# Patient Record
Sex: Female | Born: 1950 | Race: White | Hispanic: No | Marital: Married | Smoking: Never smoker
Health system: Southern US, Community
[De-identification: ages and names within clinical notes are randomized; demographics above are authoritative.]

## PROBLEM LIST (undated history)

## (undated) DIAGNOSIS — K589 Irritable bowel syndrome without diarrhea: Secondary | ICD-10-CM

## (undated) DIAGNOSIS — T7840XA Allergy, unspecified, initial encounter: Secondary | ICD-10-CM

## (undated) DIAGNOSIS — K5790 Diverticulosis of intestine, part unspecified, without perforation or abscess without bleeding: Secondary | ICD-10-CM

## (undated) DIAGNOSIS — M1990 Unspecified osteoarthritis, unspecified site: Secondary | ICD-10-CM

## (undated) DIAGNOSIS — I10 Essential (primary) hypertension: Secondary | ICD-10-CM

## (undated) DIAGNOSIS — R7303 Prediabetes: Secondary | ICD-10-CM

## (undated) DIAGNOSIS — R519 Headache, unspecified: Secondary | ICD-10-CM

## (undated) DIAGNOSIS — D649 Anemia, unspecified: Secondary | ICD-10-CM

## (undated) DIAGNOSIS — Z5189 Encounter for other specified aftercare: Secondary | ICD-10-CM

## (undated) DIAGNOSIS — N390 Urinary tract infection, site not specified: Secondary | ICD-10-CM

## (undated) DIAGNOSIS — G8929 Other chronic pain: Secondary | ICD-10-CM

## (undated) DIAGNOSIS — E039 Hypothyroidism, unspecified: Secondary | ICD-10-CM

## (undated) DIAGNOSIS — E785 Hyperlipidemia, unspecified: Secondary | ICD-10-CM

## (undated) HISTORY — DX: Allergy, unspecified, initial encounter: T78.40XA

## (undated) HISTORY — DX: Headache, unspecified: R51.9

## (undated) HISTORY — DX: Irritable bowel syndrome, unspecified: K58.9

## (undated) HISTORY — DX: Anemia, unspecified: D64.9

## (undated) HISTORY — DX: Urinary tract infection, site not specified: N39.0

## (undated) HISTORY — DX: Essential (primary) hypertension: I10

## (undated) HISTORY — DX: Unspecified osteoarthritis, unspecified site: M19.90

## (undated) HISTORY — DX: Prediabetes: R73.03

## (undated) HISTORY — DX: Encounter for other specified aftercare: Z51.89

## (undated) HISTORY — DX: Hyperlipidemia, unspecified: E78.5

## (undated) HISTORY — DX: Diverticulosis of intestine, part unspecified, without perforation or abscess without bleeding: K57.90

## (undated) HISTORY — PX: TONSILLECTOMY: SUR1361

## (undated) HISTORY — DX: Other chronic pain: G89.29

## (undated) HISTORY — DX: Hypothyroidism, unspecified: E03.9

---

## 2001-04-16 ENCOUNTER — Other Ambulatory Visit: Admission: RE | Admit: 2001-04-16 | Discharge: 2001-04-16 | Payer: Self-pay | Admitting: Family Medicine

## 2007-07-24 ENCOUNTER — Encounter: Payer: Self-pay | Admitting: Gastroenterology

## 2008-01-14 ENCOUNTER — Encounter: Payer: Self-pay | Admitting: Gastroenterology

## 2008-03-29 ENCOUNTER — Encounter: Payer: Self-pay | Admitting: Gastroenterology

## 2008-04-23 ENCOUNTER — Ambulatory Visit: Payer: Self-pay | Admitting: Gastroenterology

## 2008-04-23 DIAGNOSIS — R51 Headache: Secondary | ICD-10-CM

## 2008-04-23 DIAGNOSIS — E0590 Thyrotoxicosis, unspecified without thyrotoxic crisis or storm: Secondary | ICD-10-CM | POA: Insufficient documentation

## 2008-04-23 DIAGNOSIS — E785 Hyperlipidemia, unspecified: Secondary | ICD-10-CM | POA: Insufficient documentation

## 2008-04-23 DIAGNOSIS — Z862 Personal history of diseases of the blood and blood-forming organs and certain disorders involving the immune mechanism: Secondary | ICD-10-CM | POA: Insufficient documentation

## 2008-04-23 DIAGNOSIS — R945 Abnormal results of liver function studies: Secondary | ICD-10-CM

## 2008-04-23 DIAGNOSIS — F411 Generalized anxiety disorder: Secondary | ICD-10-CM | POA: Insufficient documentation

## 2008-04-23 DIAGNOSIS — R519 Headache, unspecified: Secondary | ICD-10-CM | POA: Insufficient documentation

## 2008-04-23 DIAGNOSIS — E663 Overweight: Secondary | ICD-10-CM | POA: Insufficient documentation

## 2008-04-29 ENCOUNTER — Ambulatory Visit (HOSPITAL_COMMUNITY): Admission: RE | Admit: 2008-04-29 | Discharge: 2008-04-29 | Payer: Self-pay | Admitting: Gastroenterology

## 2008-05-10 ENCOUNTER — Telehealth: Payer: Self-pay | Admitting: Gastroenterology

## 2009-11-12 ENCOUNTER — Encounter: Payer: Self-pay | Admitting: Sports Medicine

## 2009-11-18 ENCOUNTER — Ambulatory Visit: Payer: Self-pay | Admitting: Sports Medicine

## 2009-11-18 DIAGNOSIS — M25579 Pain in unspecified ankle and joints of unspecified foot: Secondary | ICD-10-CM | POA: Insufficient documentation

## 2009-11-18 DIAGNOSIS — M722 Plantar fascial fibromatosis: Secondary | ICD-10-CM

## 2009-11-18 DIAGNOSIS — M7750 Other enthesopathy of unspecified foot: Secondary | ICD-10-CM | POA: Insufficient documentation

## 2009-11-18 DIAGNOSIS — M25569 Pain in unspecified knee: Secondary | ICD-10-CM
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
## 2010-09-07 NOTE — Letter (Signed)
Summary: [facility]  [facility]   Imported By: Knox Royalty 11/23/2009 11:34:36  _____________________________________________________________________  External Attachment:    Type:   Image     Comment:   External Document

## 2010-10-07 ENCOUNTER — Encounter: Payer: Self-pay | Admitting: *Deleted

## 2013-08-06 HISTORY — PX: FRACTURE SURGERY: SHX138

## 2014-11-23 ENCOUNTER — Encounter: Payer: Self-pay | Admitting: Family Medicine

## 2014-11-23 DIAGNOSIS — I10 Essential (primary) hypertension: Secondary | ICD-10-CM | POA: Insufficient documentation

## 2016-05-01 ENCOUNTER — Ambulatory Visit: Payer: Self-pay | Admitting: Sports Medicine

## 2016-10-25 DIAGNOSIS — G8929 Other chronic pain: Secondary | ICD-10-CM | POA: Insufficient documentation

## 2016-10-26 ENCOUNTER — Other Ambulatory Visit: Payer: Self-pay | Admitting: Orthopedic Surgery

## 2016-10-26 DIAGNOSIS — R531 Weakness: Secondary | ICD-10-CM

## 2016-10-26 DIAGNOSIS — R52 Pain, unspecified: Secondary | ICD-10-CM

## 2016-11-06 ENCOUNTER — Ambulatory Visit
Admission: RE | Admit: 2016-11-06 | Discharge: 2016-11-06 | Disposition: A | Discharge status: Home / Self Care | Payer: Medicare Other | Source: Ambulatory Visit | Attending: Orthopedic Surgery | Admitting: Orthopedic Surgery

## 2016-11-06 ENCOUNTER — Other Ambulatory Visit: Payer: Self-pay

## 2016-11-06 DIAGNOSIS — R531 Weakness: Secondary | ICD-10-CM

## 2017-08-06 HISTORY — PX: BREAST BIOPSY: SHX20

## 2017-11-15 ENCOUNTER — Other Ambulatory Visit: Payer: Self-pay | Admitting: Family Medicine

## 2017-11-15 DIAGNOSIS — Z139 Encounter for screening, unspecified: Secondary | ICD-10-CM

## 2017-12-09 ENCOUNTER — Ambulatory Visit
Admission: RE | Admit: 2017-12-09 | Discharge: 2017-12-09 | Disposition: A | Discharge status: Home / Self Care | Payer: Medicare Other | Source: Ambulatory Visit | Attending: Family Medicine | Admitting: Family Medicine

## 2017-12-09 DIAGNOSIS — Z139 Encounter for screening, unspecified: Secondary | ICD-10-CM

## 2017-12-11 ENCOUNTER — Other Ambulatory Visit: Payer: Self-pay | Admitting: Family Medicine

## 2017-12-11 DIAGNOSIS — R928 Other abnormal and inconclusive findings on diagnostic imaging of breast: Secondary | ICD-10-CM

## 2017-12-13 ENCOUNTER — Ambulatory Visit
Admission: RE | Admit: 2017-12-13 | Discharge: 2017-12-13 | Disposition: A | Discharge status: Home / Self Care | Payer: Medicare Other | Source: Ambulatory Visit | Attending: Family Medicine | Admitting: Family Medicine

## 2017-12-13 ENCOUNTER — Other Ambulatory Visit: Payer: Self-pay | Admitting: Family Medicine

## 2017-12-13 DIAGNOSIS — R928 Other abnormal and inconclusive findings on diagnostic imaging of breast: Secondary | ICD-10-CM

## 2017-12-13 DIAGNOSIS — N6310 Unspecified lump in the right breast, unspecified quadrant: Secondary | ICD-10-CM

## 2018-02-04 DIAGNOSIS — H9201 Otalgia, right ear: Secondary | ICD-10-CM | POA: Insufficient documentation

## 2018-02-04 DIAGNOSIS — H6121 Impacted cerumen, right ear: Secondary | ICD-10-CM | POA: Insufficient documentation

## 2018-06-16 ENCOUNTER — Other Ambulatory Visit: Payer: Self-pay | Admitting: Family Medicine

## 2018-06-16 ENCOUNTER — Ambulatory Visit
Admission: RE | Admit: 2018-06-16 | Discharge: 2018-06-16 | Disposition: A | Discharge status: Home / Self Care | Payer: Medicare Other | Source: Ambulatory Visit | Attending: Family Medicine | Admitting: Family Medicine

## 2018-06-16 DIAGNOSIS — N6310 Unspecified lump in the right breast, unspecified quadrant: Secondary | ICD-10-CM

## 2018-06-20 ENCOUNTER — Other Ambulatory Visit: Payer: Self-pay | Admitting: Family Medicine

## 2018-06-20 ENCOUNTER — Ambulatory Visit
Admission: RE | Admit: 2018-06-20 | Discharge: 2018-06-20 | Disposition: A | Discharge status: Home / Self Care | Payer: PRIVATE HEALTH INSURANCE | Source: Ambulatory Visit | Attending: Family Medicine | Admitting: Family Medicine

## 2018-06-20 ENCOUNTER — Ambulatory Visit
Admission: RE | Admit: 2018-06-20 | Discharge: 2018-06-20 | Disposition: A | Discharge status: Home / Self Care | Payer: Medicare Other | Source: Ambulatory Visit | Attending: Family Medicine | Admitting: Family Medicine

## 2018-06-20 DIAGNOSIS — N6310 Unspecified lump in the right breast, unspecified quadrant: Secondary | ICD-10-CM

## 2018-06-25 ENCOUNTER — Other Ambulatory Visit: Payer: Self-pay | Admitting: Family Medicine

## 2018-06-25 ENCOUNTER — Ambulatory Visit
Admission: RE | Admit: 2018-06-25 | Discharge: 2018-06-25 | Disposition: A | Discharge status: Home / Self Care | Payer: Medicare Other | Source: Ambulatory Visit | Attending: Family Medicine | Admitting: Family Medicine

## 2018-06-25 DIAGNOSIS — N63 Unspecified lump in unspecified breast: Secondary | ICD-10-CM

## 2018-07-28 ENCOUNTER — Other Ambulatory Visit: Payer: Self-pay | Admitting: General Surgery

## 2018-07-28 DIAGNOSIS — N6489 Other specified disorders of breast: Secondary | ICD-10-CM

## 2018-08-22 ENCOUNTER — Ambulatory Visit
Admission: RE | Admit: 2018-08-22 | Discharge: 2018-08-22 | Disposition: A | Discharge status: Home / Self Care | Payer: Medicare Other | Source: Ambulatory Visit | Attending: General Surgery | Admitting: General Surgery

## 2018-08-22 ENCOUNTER — Encounter (INDEPENDENT_AMBULATORY_CARE_PROVIDER_SITE_OTHER): Payer: Self-pay

## 2018-08-22 DIAGNOSIS — N6489 Other specified disorders of breast: Secondary | ICD-10-CM

## 2018-09-09 ENCOUNTER — Emergency Department (HOSPITAL_COMMUNITY): Payer: Medicare Other

## 2018-09-09 ENCOUNTER — Emergency Department (HOSPITAL_COMMUNITY)
Admission: EM | Admit: 2018-09-09 | Discharge: 2018-09-09 | Disposition: A | Discharge status: Home / Self Care | Payer: Medicare Other | Attending: Emergency Medicine | Admitting: Emergency Medicine

## 2018-09-09 ENCOUNTER — Encounter (HOSPITAL_COMMUNITY): Payer: Self-pay | Admitting: Emergency Medicine

## 2018-09-09 ENCOUNTER — Other Ambulatory Visit: Payer: Self-pay

## 2018-09-09 DIAGNOSIS — R0789 Other chest pain: Secondary | ICD-10-CM | POA: Diagnosis not present

## 2018-09-09 DIAGNOSIS — I10 Essential (primary) hypertension: Secondary | ICD-10-CM | POA: Diagnosis not present

## 2018-09-09 DIAGNOSIS — Z79899 Other long term (current) drug therapy: Secondary | ICD-10-CM | POA: Insufficient documentation

## 2018-09-09 DIAGNOSIS — M542 Cervicalgia: Secondary | ICD-10-CM | POA: Diagnosis present
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
## 2018-09-09 NOTE — ED Notes (Signed)
Family at bedside. 
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
## 2018-09-09 NOTE — Discharge Instructions (Signed)

## 2018-09-09 NOTE — ED Notes (Signed)
ED Provider at bedside. 

## 2018-09-24 DIAGNOSIS — R0789 Other chest pain: Secondary | ICD-10-CM | POA: Insufficient documentation

## 2018-09-26 ENCOUNTER — Other Ambulatory Visit: Payer: Self-pay | Admitting: Orthopedic Surgery

## 2018-09-26 DIAGNOSIS — R072 Precordial pain: Secondary | ICD-10-CM

## 2018-10-06 ENCOUNTER — Ambulatory Visit
Admission: RE | Admit: 2018-10-06 | Discharge: 2018-10-06 | Disposition: A | Discharge status: Home / Self Care | Payer: Medicare Other | Source: Ambulatory Visit | Attending: Orthopedic Surgery | Admitting: Orthopedic Surgery

## 2018-10-06 DIAGNOSIS — R072 Precordial pain: Secondary | ICD-10-CM

## 2019-01-20 ENCOUNTER — Other Ambulatory Visit: Payer: Self-pay | Admitting: Orthopedic Surgery

## 2019-01-20 DIAGNOSIS — R072 Precordial pain: Secondary | ICD-10-CM

## 2019-01-27 ENCOUNTER — Ambulatory Visit
Admission: RE | Admit: 2019-01-27 | Discharge: 2019-01-27 | Disposition: A | Discharge status: Home / Self Care | Payer: Medicare Other | Source: Ambulatory Visit | Attending: Orthopedic Surgery | Admitting: Orthopedic Surgery

## 2019-01-27 DIAGNOSIS — R072 Precordial pain: Secondary | ICD-10-CM

## 2019-08-14 DIAGNOSIS — M25562 Pain in left knee: Secondary | ICD-10-CM | POA: Diagnosis not present

## 2019-09-05 DIAGNOSIS — M659 Synovitis and tenosynovitis, unspecified: Secondary | ICD-10-CM | POA: Diagnosis not present

## 2019-09-07 DIAGNOSIS — M1712 Unilateral primary osteoarthritis, left knee: Secondary | ICD-10-CM | POA: Diagnosis not present

## 2019-09-07 DIAGNOSIS — M2352 Chronic instability of knee, left knee: Secondary | ICD-10-CM | POA: Diagnosis not present

## 2019-09-11 ENCOUNTER — Other Ambulatory Visit: Payer: Self-pay | Admitting: Orthopedic Surgery

## 2019-09-11 DIAGNOSIS — M2352 Chronic instability of knee, left knee: Secondary | ICD-10-CM

## 2019-09-11 DIAGNOSIS — M25562 Pain in left knee: Secondary | ICD-10-CM

## 2019-09-14 ENCOUNTER — Ambulatory Visit
Admission: RE | Admit: 2019-09-14 | Discharge: 2019-09-14 | Disposition: A | Discharge status: Home / Self Care | Payer: Medicare Other | Source: Ambulatory Visit | Attending: Orthopedic Surgery | Admitting: Orthopedic Surgery

## 2019-09-14 DIAGNOSIS — S83422A Sprain of lateral collateral ligament of left knee, initial encounter: Secondary | ICD-10-CM | POA: Diagnosis not present

## 2019-09-14 DIAGNOSIS — M25562 Pain in left knee: Secondary | ICD-10-CM

## 2019-09-14 DIAGNOSIS — M2352 Chronic instability of knee, left knee: Secondary | ICD-10-CM

## 2019-09-17 ENCOUNTER — Other Ambulatory Visit: Payer: Medicare Other

## 2019-09-17 DIAGNOSIS — S83422A Sprain of lateral collateral ligament of left knee, initial encounter: Secondary | ICD-10-CM | POA: Diagnosis not present

## 2019-09-17 DIAGNOSIS — M25562 Pain in left knee: Secondary | ICD-10-CM | POA: Diagnosis not present

## 2019-09-30 IMAGING — US ULTRASOUND RIGHT BREAST LIMITED
1 series · 11 of 11 positions shown · non-contrast
Comparison: Previous exam(s).

CLINICAL DATA: The patient is status post ultrasound-guided core
needle biopsy of 2 sites in her right breast on June 20, 2018.
She reports an increasing bruising and pain in her right breast. She
also reports bleeding from 1 of the biopsy sites yesterday.

EXAM:
ULTRASOUND OF THE RIGHT BREAST

[Series 1: ultrasound right breast limited · 0.10mm/px · 11 of 11 slices shown]
[im 1/11]
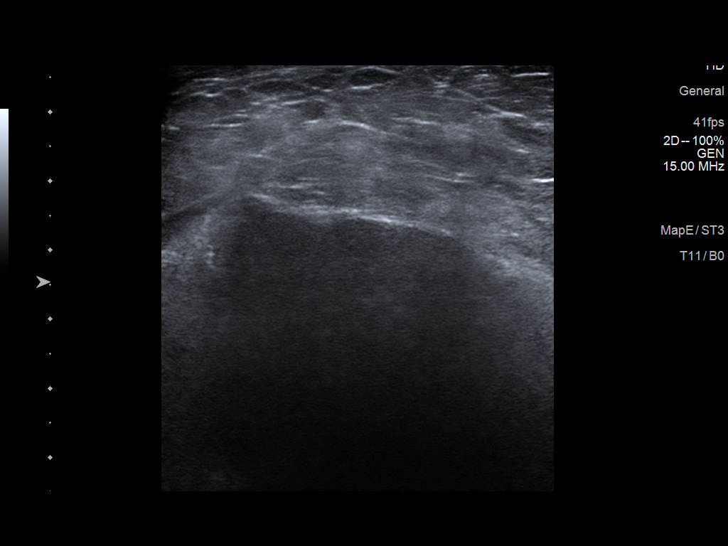
[im 2/11]
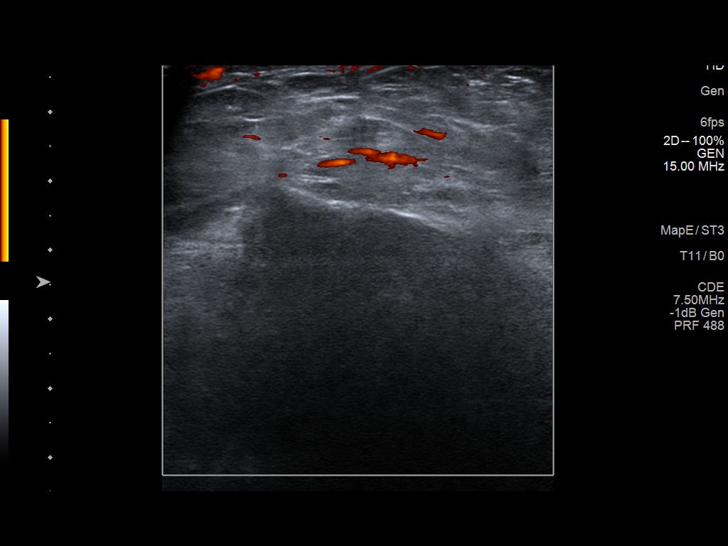
[im 3/11]
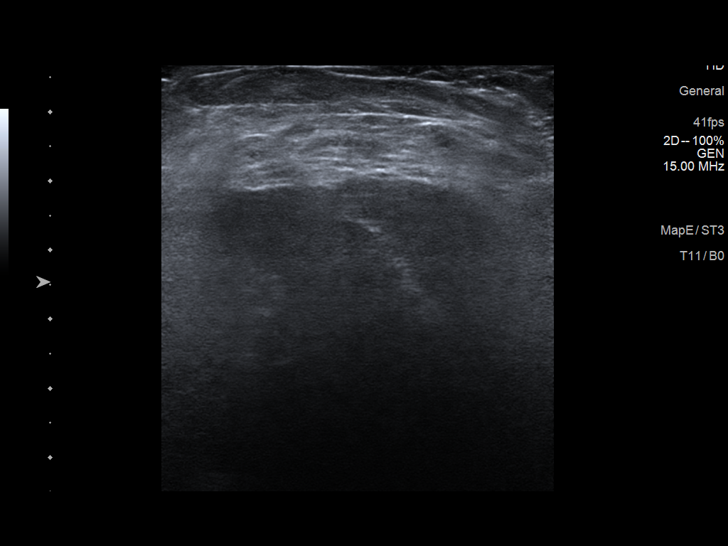
[im 4/11]
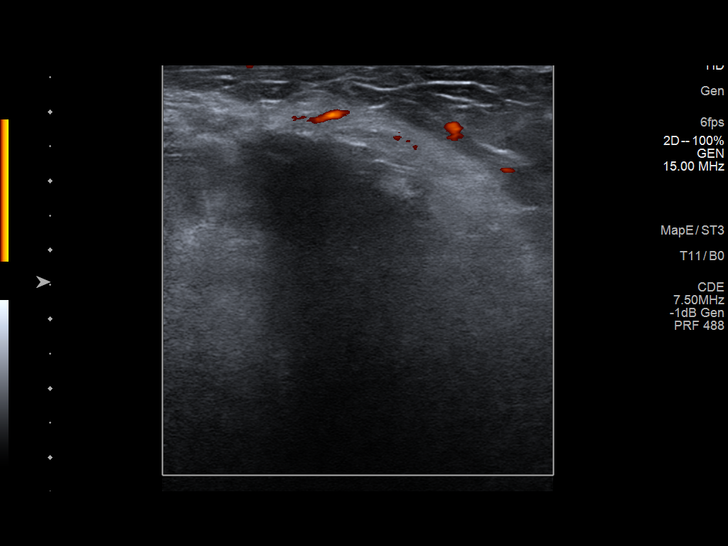
[im 5/11]
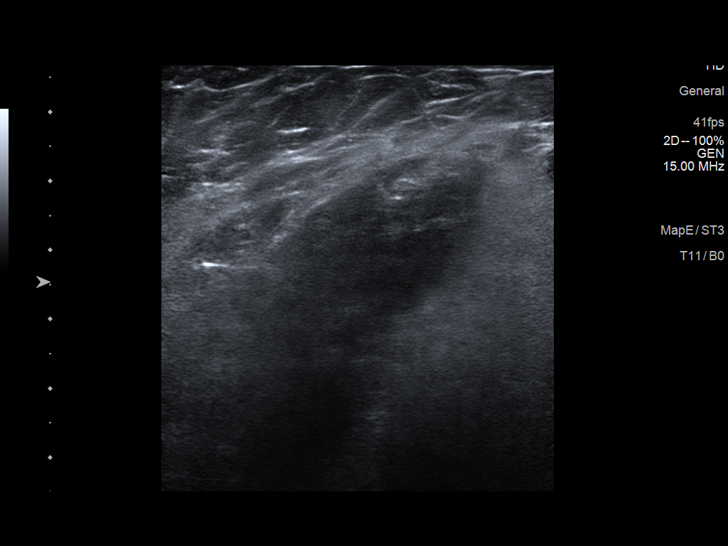
[im 6/11]
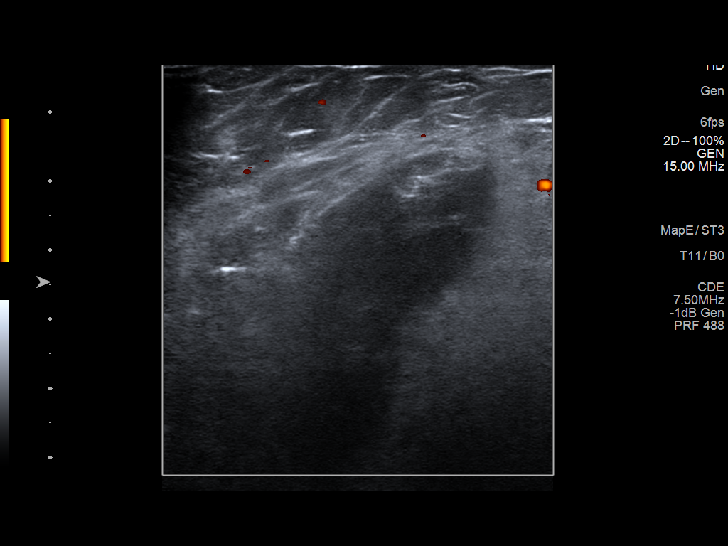
[im 7/11]
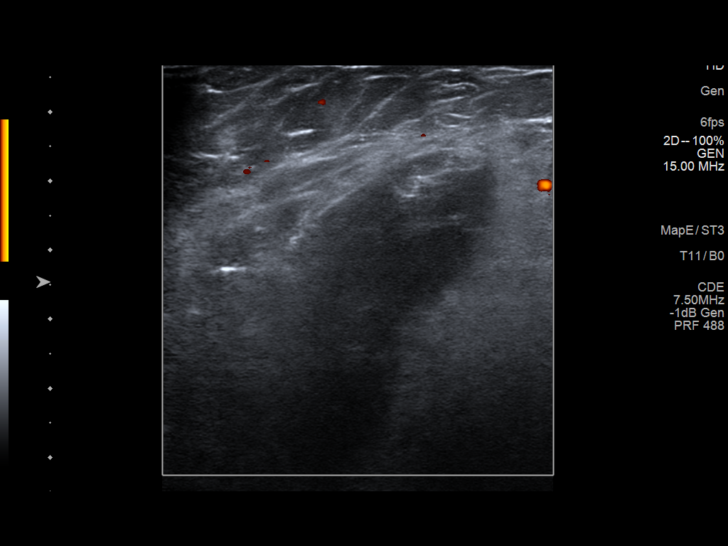
[im 8/11]
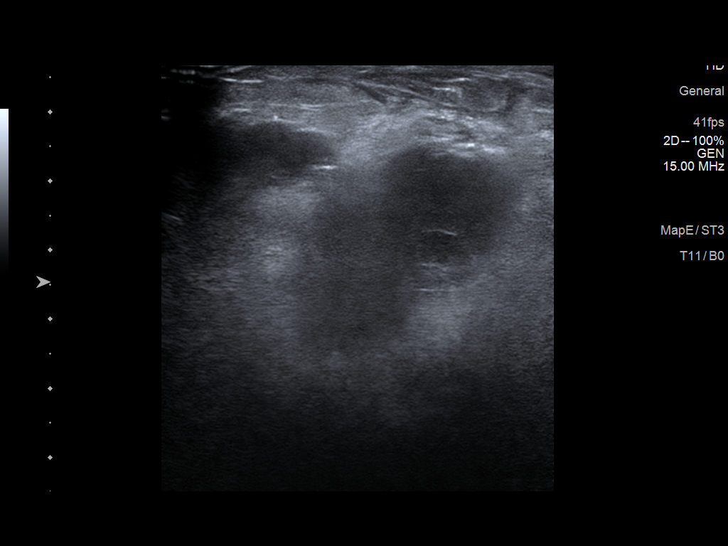
[im 9/11]
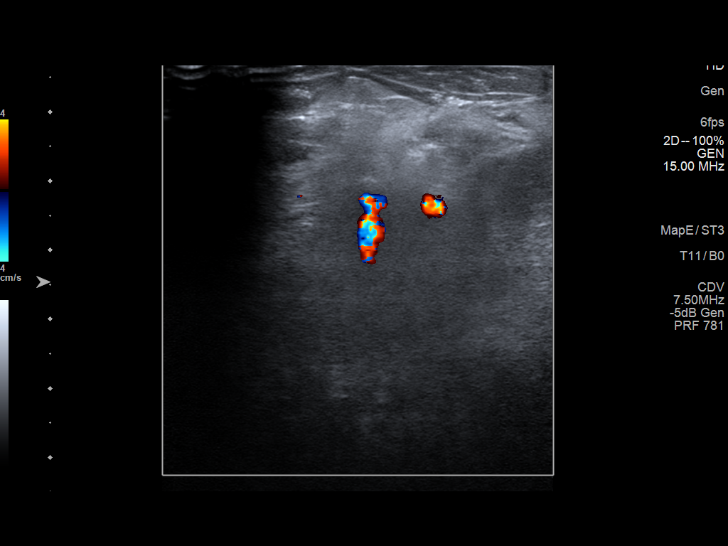
[im 10/11]
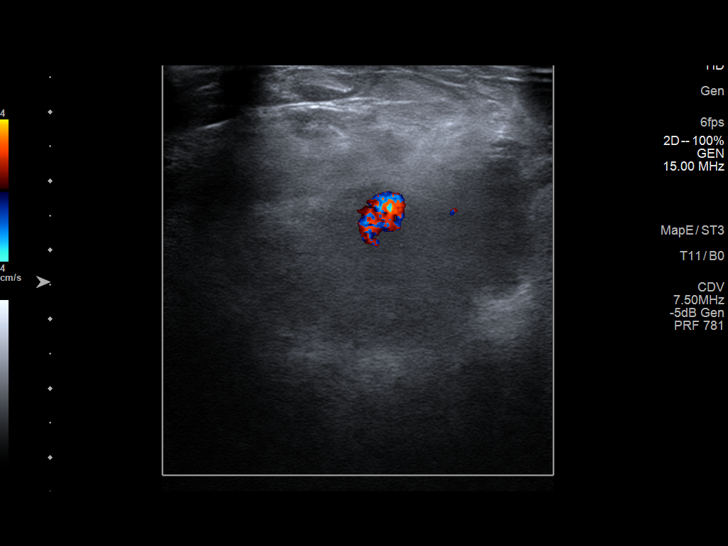
[im 11/11]
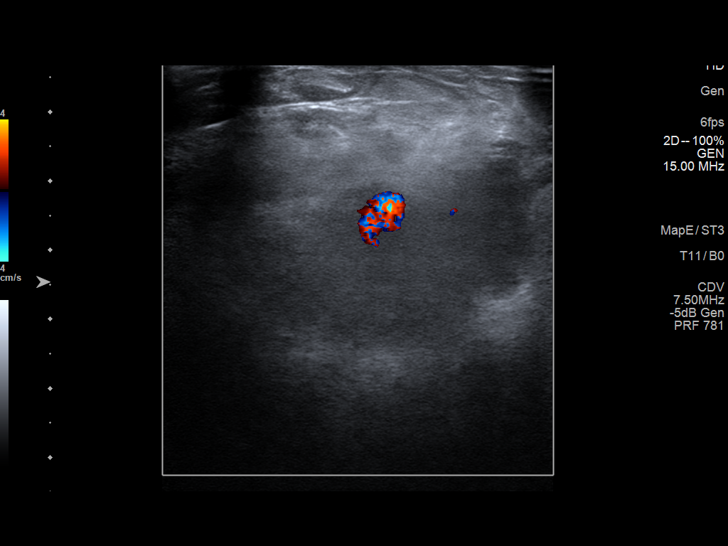

[11 of 11 positions shown; findings below may reference images not displayed]

FINDINGS: On physical exam, there is a large right breast hematoma, occupying
most of the lower and outer right breast, and extending into the
left breast. No active bleeding is seen at the biopsy sites.

Targeted ultrasound is performed, showing fluid collections in the
dependent portion of the right breast, extending from 6-9 o'clock.
At 9 o'clock 5 cm from the nipple there is a large vessel with a
disturbed turbulent high velocity flow.
IMPRESSION: Large right breast hematoma.

Large arterial vessel with disturbed turbulent flow in the right 9
o'clock breast, suspicious for AV fistula.

RECOMMENDATION:
Surgical consultation to assess further treatment options by
conservative means or surgical evacuation.

I have discussed the findings and recommendations with the patient.
Results were also provided in writing at the conclusion of the
visit. If applicable, a reminder letter will be sent to the patient
regarding the next appointment.

BI-RADS CATEGORY  2: Benign.

## 2019-12-31 DIAGNOSIS — H524 Presbyopia: Secondary | ICD-10-CM | POA: Diagnosis not present

## 2019-12-31 DIAGNOSIS — H52223 Regular astigmatism, bilateral: Secondary | ICD-10-CM | POA: Diagnosis not present

## 2019-12-31 DIAGNOSIS — H5203 Hypermetropia, bilateral: Secondary | ICD-10-CM | POA: Diagnosis not present

## 2019-12-31 DIAGNOSIS — H2513 Age-related nuclear cataract, bilateral: Secondary | ICD-10-CM | POA: Diagnosis not present

## 2020-01-19 DIAGNOSIS — R350 Frequency of micturition: Secondary | ICD-10-CM | POA: Diagnosis not present

## 2020-01-19 DIAGNOSIS — R300 Dysuria: Secondary | ICD-10-CM | POA: Diagnosis not present

## 2020-01-20 DIAGNOSIS — L218 Other seborrheic dermatitis: Secondary | ICD-10-CM | POA: Diagnosis not present

## 2020-01-20 DIAGNOSIS — L280 Lichen simplex chronicus: Secondary | ICD-10-CM | POA: Diagnosis not present

## 2020-01-20 DIAGNOSIS — L304 Erythema intertrigo: Secondary | ICD-10-CM | POA: Diagnosis not present

## 2020-02-16 DIAGNOSIS — R946 Abnormal results of thyroid function studies: Secondary | ICD-10-CM | POA: Diagnosis not present

## 2020-02-16 DIAGNOSIS — N390 Urinary tract infection, site not specified: Secondary | ICD-10-CM | POA: Diagnosis not present

## 2020-02-16 DIAGNOSIS — R7301 Impaired fasting glucose: Secondary | ICD-10-CM | POA: Diagnosis not present

## 2020-02-16 DIAGNOSIS — R5383 Other fatigue: Secondary | ICD-10-CM | POA: Diagnosis not present

## 2020-02-16 DIAGNOSIS — E782 Mixed hyperlipidemia: Secondary | ICD-10-CM | POA: Diagnosis not present

## 2020-02-16 DIAGNOSIS — E039 Hypothyroidism, unspecified: Secondary | ICD-10-CM | POA: Diagnosis not present

## 2020-03-02 DIAGNOSIS — L304 Erythema intertrigo: Secondary | ICD-10-CM | POA: Diagnosis not present

## 2020-03-02 DIAGNOSIS — L218 Other seborrheic dermatitis: Secondary | ICD-10-CM | POA: Diagnosis not present

## 2020-03-02 DIAGNOSIS — L280 Lichen simplex chronicus: Secondary | ICD-10-CM | POA: Diagnosis not present

## 2020-04-14 DIAGNOSIS — R7303 Prediabetes: Secondary | ICD-10-CM | POA: Diagnosis not present

## 2020-04-14 DIAGNOSIS — I10 Essential (primary) hypertension: Secondary | ICD-10-CM | POA: Diagnosis not present

## 2020-04-14 DIAGNOSIS — E785 Hyperlipidemia, unspecified: Secondary | ICD-10-CM | POA: Diagnosis not present

## 2020-04-15 ENCOUNTER — Other Ambulatory Visit (HOSPITAL_COMMUNITY): Payer: Self-pay | Admitting: Internal Medicine

## 2020-04-15 DIAGNOSIS — I739 Peripheral vascular disease, unspecified: Secondary | ICD-10-CM

## 2020-04-18 ENCOUNTER — Ambulatory Visit (HOSPITAL_COMMUNITY)
Admission: RE | Admit: 2020-04-18 | Discharge: 2020-04-18 | Disposition: A | Discharge status: Home / Self Care | Payer: Medicare Other | Source: Ambulatory Visit | Attending: Internal Medicine | Admitting: Internal Medicine

## 2020-04-18 ENCOUNTER — Other Ambulatory Visit: Payer: Self-pay

## 2020-04-18 DIAGNOSIS — I739 Peripheral vascular disease, unspecified: Secondary | ICD-10-CM | POA: Diagnosis not present

## 2020-04-22 DIAGNOSIS — N390 Urinary tract infection, site not specified: Secondary | ICD-10-CM | POA: Diagnosis not present

## 2020-04-22 DIAGNOSIS — R300 Dysuria: Secondary | ICD-10-CM | POA: Diagnosis not present

## 2020-04-25 ENCOUNTER — Other Ambulatory Visit: Payer: Self-pay | Admitting: Internal Medicine

## 2020-04-25 DIAGNOSIS — R5381 Other malaise: Secondary | ICD-10-CM

## 2020-04-28 DIAGNOSIS — R350 Frequency of micturition: Secondary | ICD-10-CM | POA: Diagnosis not present

## 2020-04-28 DIAGNOSIS — N390 Urinary tract infection, site not specified: Secondary | ICD-10-CM | POA: Diagnosis not present

## 2020-05-03 ENCOUNTER — Other Ambulatory Visit: Payer: Self-pay | Admitting: Internal Medicine

## 2020-06-06 DIAGNOSIS — Z1211 Encounter for screening for malignant neoplasm of colon: Secondary | ICD-10-CM | POA: Diagnosis not present

## 2020-06-06 DIAGNOSIS — Z1212 Encounter for screening for malignant neoplasm of rectum: Secondary | ICD-10-CM | POA: Diagnosis not present

## 2020-06-17 LAB — COLOGUARD: COLOGUARD: NEGATIVE

## 2020-07-05 DIAGNOSIS — U071 COVID-19: Secondary | ICD-10-CM | POA: Diagnosis not present

## 2020-07-05 DIAGNOSIS — R509 Fever, unspecified: Secondary | ICD-10-CM | POA: Diagnosis not present

## 2020-07-05 DIAGNOSIS — I10 Essential (primary) hypertension: Secondary | ICD-10-CM | POA: Diagnosis not present

## 2020-07-05 DIAGNOSIS — Z1152 Encounter for screening for COVID-19: Secondary | ICD-10-CM | POA: Diagnosis not present

## 2020-07-06 ENCOUNTER — Ambulatory Visit (HOSPITAL_COMMUNITY)
Admission: RE | Admit: 2020-07-06 | Discharge: 2020-07-06 | Disposition: A | Discharge status: Home / Self Care | Payer: Medicare Other | Source: Ambulatory Visit | Attending: Pulmonary Disease | Admitting: Pulmonary Disease

## 2020-07-06 ENCOUNTER — Other Ambulatory Visit: Payer: Self-pay | Admitting: Oncology

## 2020-07-06 DIAGNOSIS — U071 COVID-19: Secondary | ICD-10-CM | POA: Insufficient documentation

## 2020-07-06 DIAGNOSIS — Z23 Encounter for immunization: Secondary | ICD-10-CM | POA: Diagnosis not present

## 2020-07-06 MED ORDER — SOTROVIMAB 500 MG/8ML IV SOLN
500.0000 mg | Freq: Once | INTRAVENOUS | Status: AC
  Administered 2020-07-06: 500 mg via INTRAVENOUS

## 2020-07-06 MED ORDER — FAMOTIDINE IN NACL 20-0.9 MG/50ML-% IV SOLN: 20.0000 mg | Freq: Once | INTRAVENOUS | Status: DC | PRN

## 2020-07-06 MED ORDER — SODIUM CHLORIDE 0.9 % IV SOLN: INTRAVENOUS | Status: DC | PRN

## 2020-07-06 MED ORDER — ALBUTEROL SULFATE HFA 108 (90 BASE) MCG/ACT IN AERS: 2.0000 | INHALATION_SPRAY | Freq: Once | RESPIRATORY_TRACT | Status: DC | PRN

## 2020-07-06 MED ORDER — DIPHENHYDRAMINE HCL 50 MG/ML IJ SOLN: 50.0000 mg | Freq: Once | INTRAMUSCULAR | Status: DC | PRN

## 2020-07-06 MED ORDER — METHYLPREDNISOLONE SODIUM SUCC 125 MG IJ SOLR: 125.0000 mg | Freq: Once | INTRAMUSCULAR | Status: DC | PRN

## 2020-07-06 MED ORDER — EPINEPHRINE 0.3 MG/0.3ML IJ SOAJ: 0.3000 mg | Freq: Once | INTRAMUSCULAR | Status: DC | PRN

## 2020-07-06 NOTE — Progress Notes (Signed)
I connected by phone with  Mrs. Schreur to discuss the potential use of an new treatment for mild to moderate COVID-19 viral infection in non-hospitalized patients.   This patient is a age/sex that meets the FDA criteria for Emergency Use Authorization of casirivimab\imdevimab.  Has a (+) direct SARS-CoV-2 viral test result 1. Has mild or moderate COVID-19  2. Is ? 71 years of age and weighs ? 40 kg 3. Is NOT hospitalized due to COVID-19 4. Is NOT requiring oxygen therapy or requiring an increase in baseline oxygen flow rate due to COVID-19 5. Is within 10 days of symptom onset 6. Has at least one of the high risk factor(s) for progression to severe COVID-19 and/or hospitalization as defined in EUA. Specific high risk criteria : Past Medical History:  Diagnosis Date  . Hyperlipidemia   . Hypertension   ?  ?    Symptom onset  06/29/20   I have spoken and communicated the following to the patient or parent/caregiver:   1. FDA has authorized the emergency use of casirivimab\imdevimab for the treatment of mild to moderate COVID-19 in adults and pediatric patients with positive results of direct SARS-CoV-2 viral testing who are 79 years of age and older weighing at least 40 kg, and who are at high risk for progressing to severe COVID-19 and/or hospitalization.   2. The significant known and potential risks and benefits of casirivimab\imdevimab, and the extent to which such potential risks and benefits are unknown.   3. Information on available alternative treatments and the risks and benefits of those alternatives, including clinical trials.   4. Patients treated with casirivimab\imdevimab should continue to self-isolate and use infection control measures (e.g., wear mask, isolate, social distance, avoid sharing personal items, clean and disinfect "high touch" surfaces, and frequent handwashing) according to CDC guidelines.    5. The patient or parent/caregiver has the option to accept or  refuse casirivimab\imdevimab .   After reviewing this information with the patient, The patient agreed to proceed with receiving casirivimab\imdevimab infusion and will be provided a copy of the Fact sheet prior to receiving the infusion.Rulon Abide, AGNP-C [phone] ([place])

## 2020-07-06 NOTE — Discharge Instructions (Signed)
10 Things You Can Do to Manage Your COVID-19 Symptoms at Home If you have possible or confirmed COVID-19: 1. Stay home from work and school. And stay away from other public places. If you must go out, avoid using any kind of public transportation, ridesharing, or taxis. 2. Monitor your symptoms carefully. If your symptoms get worse, call your healthcare provider immediately. 3. Get rest and stay hydrated. 4. If you have a medical appointment, call the healthcare provider ahead of time and tell them that you have or may have COVID-19. 5. For medical emergencies, call 911 and notify the dispatch personnel that you have or may have COVID-19. 6. Cover your cough and sneezes with a tissue or use the inside of your elbow. 7. Wash your hands often with soap and water for at least 20 seconds or clean your hands with an alcohol-based hand sanitizer that contains at least 60% alcohol. 8. As much as possible, stay in a specific room and away from other people in your home. Also, you should use a separate bathroom, if available. If you need to be around other people in or outside of the home, wear a mask. 9. Avoid sharing personal items with other people in your household, like dishes, towels, and bedding. 10. Clean all surfaces that are touched often, like counters, tabletops, and doorknobs. Use household cleaning sprays or wipes according to the label instructions. [email or web address] 02/04/2019 This information is not intended to replace advice given to you by your health care provider. Make sure you discuss any questions you have with your health care provider. Document Revised: 07/09/2019 Document Reviewed: 07/09/2019 Elsevier Patient Education  [place].   What types of side effects do monoclonal antibody drugs cause?  Common side effects  In general, the more common side effects caused by monoclonal antibody drugs include: . Allergic reactions, such as hives or itching . Flu-like signs  and symptoms, including chills, fatigue, fever, and muscle aches and pains . Nausea, vomiting . Diarrhea . Skin rashes . Low blood pressure   The CDC is recommending patients who receive monoclonal antibody treatments wait at least 90 days before being vaccinated.  Currently, there are no data on the safety and efficacy of mRNA COVID-19 vaccines in persons who received monoclonal antibodies or convalescent plasma as part of COVID-19 treatment. Based on the estimated half-life of such therapies as well as evidence suggesting that reinfection is uncommon in the 90 days after initial infection, vaccination should be deferred for at least 90 days, as a precautionary measure until additional information becomes available, to avoid interference of the antibody treatment with vaccine-induced immune responses.   What types of side effects do monoclonal antibody drugs cause?  Common side effects  In general, the more common side effects caused by monoclonal antibody drugs include: . Allergic reactions, such as hives or itching . Flu-like signs and symptoms, including chills, fatigue, fever, and muscle aches and pains . Nausea, vomiting . Diarrhea . Skin rashes . Low blood pressure   The CDC is recommending patients who receive monoclonal antibody treatments wait at least 90 days before being vaccinated.  Currently, there are no data on the safety and efficacy of mRNA COVID-19 vaccines in persons who received monoclonal antibodies or convalescent plasma as part of COVID-19 treatment. Based on the estimated half-life of such therapies as well as evidence suggesting that reinfection is uncommon in the 90 days after initial infection, vaccination should be deferred for at least 90 days, as a precautionary  measure until additional information becomes available, to avoid interference of the antibody treatment with vaccine-induced immune responses.'  If you have any questions or concerns after the  infusion please call the [facility] Provider on call at [phone]. This number is ONLY intended for your use regarding questions or concerns about the infusion post-treatment side-effects.  Please do not provide this number to others for use. For return to work notes please contact your primary care provider.   If someone you know is interested in receiving treatment please have them call the [facility] hotline at [phone].

## 2020-07-06 NOTE — Progress Notes (Signed)
Patient reviewed Fact Sheet for Patients, Parents, and Caregivers for Emergency Use Authorization (EUA) of Sotrovimab for the Treatment of Coronavirus. Patient also reviewed and is agreeable to the estimated cost of treatment. Patient is agreeable to proceed.   

## 2020-07-06 NOTE — Progress Notes (Signed)
Diagnosis: COVID-19  Physician: Dr. Patrick Wright  Procedure: [facility]fusion [facility] Med: Sotrovimab infusion - Provided patient with sotrovimab fact sheet for patients, parents, and caregivers prior to infusion.   Complications: No immediate complications noted  Discharge: Discharged home    

## 2020-07-07 ENCOUNTER — Ambulatory Visit (HOSPITAL_COMMUNITY): Payer: Medicare Other

## 2020-07-20 DIAGNOSIS — R309 Painful micturition, unspecified: Secondary | ICD-10-CM | POA: Diagnosis not present

## 2020-07-22 ENCOUNTER — Other Ambulatory Visit: Payer: Self-pay | Admitting: Internal Medicine

## 2020-07-22 DIAGNOSIS — I2510 Atherosclerotic heart disease of native coronary artery without angina pectoris: Secondary | ICD-10-CM

## 2020-07-22 DIAGNOSIS — E785 Hyperlipidemia, unspecified: Secondary | ICD-10-CM

## 2020-07-26 DIAGNOSIS — Z1231 Encounter for screening mammogram for malignant neoplasm of breast: Secondary | ICD-10-CM | POA: Diagnosis not present

## 2020-08-04 ENCOUNTER — Other Ambulatory Visit: Payer: Self-pay | Admitting: Obstetrics and Gynecology

## 2020-08-04 DIAGNOSIS — R921 Mammographic calcification found on diagnostic imaging of breast: Secondary | ICD-10-CM

## 2020-08-09 DIAGNOSIS — R0989 Other specified symptoms and signs involving the circulatory and respiratory systems: Secondary | ICD-10-CM | POA: Diagnosis not present

## 2020-08-09 DIAGNOSIS — I10 Essential (primary) hypertension: Secondary | ICD-10-CM | POA: Diagnosis not present

## 2020-08-24 ENCOUNTER — Other Ambulatory Visit: Payer: Self-pay | Admitting: Internal Medicine

## 2020-08-24 DIAGNOSIS — N951 Menopausal and female climacteric states: Secondary | ICD-10-CM

## 2020-08-24 DIAGNOSIS — E2839 Other primary ovarian failure: Secondary | ICD-10-CM

## 2020-08-24 DIAGNOSIS — E039 Hypothyroidism, unspecified: Secondary | ICD-10-CM

## 2020-09-05 DIAGNOSIS — R0982 Postnasal drip: Secondary | ICD-10-CM | POA: Diagnosis not present

## 2020-09-05 DIAGNOSIS — L299 Pruritus, unspecified: Secondary | ICD-10-CM | POA: Insufficient documentation

## 2020-09-05 DIAGNOSIS — H6123 Impacted cerumen, bilateral: Secondary | ICD-10-CM | POA: Diagnosis not present

## 2020-09-05 DIAGNOSIS — H938X3 Other specified disorders of ear, bilateral: Secondary | ICD-10-CM | POA: Diagnosis not present

## 2020-09-12 ENCOUNTER — Ambulatory Visit
Admission: RE | Admit: 2020-09-12 | Discharge: 2020-09-12 | Disposition: A | Discharge status: Home / Self Care | Payer: Medicare Other | Source: Ambulatory Visit | Attending: Obstetrics and Gynecology | Admitting: Obstetrics and Gynecology

## 2020-09-12 ENCOUNTER — Ambulatory Visit: Payer: Medicare Other

## 2020-09-12 ENCOUNTER — Other Ambulatory Visit: Payer: Self-pay

## 2020-09-12 ENCOUNTER — Other Ambulatory Visit: Payer: Self-pay | Admitting: Obstetrics and Gynecology

## 2020-09-12 DIAGNOSIS — R921 Mammographic calcification found on diagnostic imaging of breast: Secondary | ICD-10-CM

## 2020-09-12 DIAGNOSIS — R922 Inconclusive mammogram: Secondary | ICD-10-CM | POA: Diagnosis not present

## 2020-09-21 ENCOUNTER — Ambulatory Visit
Admission: RE | Admit: 2020-09-21 | Discharge: 2020-09-21 | Disposition: A | Discharge status: Home / Self Care | Payer: Medicare Other | Source: Ambulatory Visit | Attending: Adult Health | Admitting: Adult Health

## 2020-09-21 ENCOUNTER — Other Ambulatory Visit: Payer: Self-pay

## 2020-09-21 ENCOUNTER — Other Ambulatory Visit: Payer: Self-pay | Admitting: Adult Health

## 2020-09-21 ENCOUNTER — Encounter: Payer: Self-pay | Admitting: Gastroenterology

## 2020-09-21 DIAGNOSIS — K5732 Diverticulitis of large intestine without perforation or abscess without bleeding: Secondary | ICD-10-CM | POA: Diagnosis not present

## 2020-09-21 DIAGNOSIS — I10 Essential (primary) hypertension: Secondary | ICD-10-CM | POA: Diagnosis not present

## 2020-09-21 DIAGNOSIS — R1030 Lower abdominal pain, unspecified: Secondary | ICD-10-CM

## 2020-09-21 DIAGNOSIS — K5733 Diverticulitis of large intestine without perforation or abscess with bleeding: Secondary | ICD-10-CM

## 2020-09-21 DIAGNOSIS — K625 Hemorrhage of anus and rectum: Secondary | ICD-10-CM | POA: Diagnosis not present

## 2020-09-21 MED ORDER — IOPAMIDOL (ISOVUE-300) INJECTION 61%
100.0000 mL | Freq: Once | INTRAVENOUS | Status: AC | PRN
  Administered 2020-09-21: 100 mL via INTRAVENOUS

## 2020-09-22 ENCOUNTER — Ambulatory Visit
Admission: RE | Admit: 2020-09-22 | Discharge: 2020-09-22 | Disposition: A | Discharge status: Home / Self Care | Payer: No Typology Code available for payment source | Source: Ambulatory Visit | Attending: Internal Medicine | Admitting: Internal Medicine

## 2020-09-22 DIAGNOSIS — I2510 Atherosclerotic heart disease of native coronary artery without angina pectoris: Secondary | ICD-10-CM

## 2020-09-22 DIAGNOSIS — E785 Hyperlipidemia, unspecified: Secondary | ICD-10-CM

## 2020-09-28 ENCOUNTER — Observation Stay (HOSPITAL_COMMUNITY)
Admission: EM | Admit: 2020-09-28 | Discharge: 2020-09-29 | Disposition: A | Discharge status: Home / Self Care | Payer: Medicare Other | Attending: Internal Medicine | Admitting: Internal Medicine

## 2020-09-28 ENCOUNTER — Encounter (HOSPITAL_COMMUNITY): Payer: Self-pay

## 2020-09-28 ENCOUNTER — Emergency Department (HOSPITAL_COMMUNITY): Payer: Medicare Other

## 2020-09-28 ENCOUNTER — Other Ambulatory Visit: Payer: Self-pay

## 2020-09-28 DIAGNOSIS — K922 Gastrointestinal hemorrhage, unspecified: Principal | ICD-10-CM | POA: Diagnosis present

## 2020-09-28 DIAGNOSIS — K5732 Diverticulitis of large intestine without perforation or abscess without bleeding: Secondary | ICD-10-CM | POA: Insufficient documentation

## 2020-09-28 DIAGNOSIS — K5791 Diverticulosis of intestine, part unspecified, without perforation or abscess with bleeding: Secondary | ICD-10-CM | POA: Diagnosis not present

## 2020-09-28 DIAGNOSIS — K8020 Calculus of gallbladder without cholecystitis without obstruction: Secondary | ICD-10-CM | POA: Diagnosis not present

## 2020-09-28 DIAGNOSIS — E039 Hypothyroidism, unspecified: Secondary | ICD-10-CM | POA: Diagnosis not present

## 2020-09-28 DIAGNOSIS — K5733 Diverticulitis of large intestine without perforation or abscess with bleeding: Secondary | ICD-10-CM | POA: Diagnosis not present

## 2020-09-28 DIAGNOSIS — Z79890 Hormone replacement therapy (postmenopausal): Secondary | ICD-10-CM | POA: Insufficient documentation

## 2020-09-28 DIAGNOSIS — Z8616 Personal history of COVID-19: Secondary | ICD-10-CM | POA: Insufficient documentation

## 2020-09-28 DIAGNOSIS — Z7982 Long term (current) use of aspirin: Secondary | ICD-10-CM | POA: Insufficient documentation

## 2020-09-28 DIAGNOSIS — K5731 Diverticulosis of large intestine without perforation or abscess with bleeding: Secondary | ICD-10-CM | POA: Diagnosis not present

## 2020-09-28 DIAGNOSIS — Z79899 Other long term (current) drug therapy: Secondary | ICD-10-CM | POA: Diagnosis not present

## 2020-09-28 DIAGNOSIS — E785 Hyperlipidemia, unspecified: Secondary | ICD-10-CM | POA: Diagnosis not present

## 2020-09-28 DIAGNOSIS — I10 Essential (primary) hypertension: Secondary | ICD-10-CM | POA: Diagnosis present

## 2020-09-28 DIAGNOSIS — D62 Acute posthemorrhagic anemia: Secondary | ICD-10-CM

## 2020-09-28 DIAGNOSIS — K5792 Diverticulitis of intestine, part unspecified, without perforation or abscess without bleeding: Secondary | ICD-10-CM | POA: Diagnosis not present

## 2020-09-28 DIAGNOSIS — Z20822 Contact with and (suspected) exposure to covid-19: Secondary | ICD-10-CM | POA: Diagnosis not present

## 2020-09-28 DIAGNOSIS — K625 Hemorrhage of anus and rectum: Secondary | ICD-10-CM | POA: Diagnosis present

## 2020-09-28 HISTORY — PX: IR ANGIOGRAM SELECTIVE EACH ADDITIONAL VESSEL: IMG667

## 2020-09-28 HISTORY — PX: IR ANGIOGRAM VISCERAL SELECTIVE: IMG657

## 2020-09-28 HISTORY — PX: IR EMBO ART  VEN HEMORR LYMPH EXTRAV  INC GUIDE ROADMAPPING: IMG5450

## 2020-09-28 HISTORY — PX: IR US GUIDE VASC ACCESS RIGHT: IMG2390

## 2020-09-28 LAB — TYPE AND SCREEN
ABO/RH(D): O NEG
Antibody Screen: NEGATIVE

## 2020-09-28 LAB — COMPREHENSIVE METABOLIC PANEL
ALT: 43 U/L (ref 0–44)
AST: 35 U/L (ref 15–41)
Albumin: 3.7 g/dL (ref 3.5–5.0)
Alkaline Phosphatase: 44 U/L (ref 38–126)
Anion gap: 9 (ref 5–15)
BUN: 16 mg/dL (ref 8–23)
CO2: 23 mmol/L (ref 22–32)
Calcium: 8.8 mg/dL — ABNORMAL LOW (ref 8.9–10.3)
Chloride: 102 mmol/L (ref 98–111)
Creatinine, Ser: 0.74 mg/dL (ref 0.44–1.00)
GFR, Estimated: >60 mL/min (ref >60)
Glucose, Bld: 160 mg/dL — ABNORMAL HIGH (ref 70–99)
Potassium: 3.9 mmol/L (ref 3.5–5.1)
Sodium: 134 mmol/L — ABNORMAL LOW (ref 135–145)
Total Bilirubin: 0.5 mg/dL (ref 0.3–1.2)
Total Protein: 6.6 g/dL (ref 6.5–8.1)

## 2020-09-28 LAB — RESP PANEL BY RT-PCR (FLU A&B, COVID) ARPGX2
Influenza A by PCR: NEGATIVE
Influenza B by PCR: NEGATIVE
SARS Coronavirus 2 by RT PCR: NEGATIVE

## 2020-09-28 LAB — CBC
HCT: 33.5 % — ABNORMAL LOW (ref 36.0–46.0)
Hemoglobin: 10.7 g/dL — ABNORMAL LOW (ref 12.0–15.0)
MCH: 31.9 pg (ref 26.0–34.0)
MCHC: 31.9 g/dL (ref 30.0–36.0)
MCV: 100 fL (ref 80.0–100.0)
Platelets: 392 10*3/uL (ref 150–400)
RBC: 3.35 MIL/uL — ABNORMAL LOW (ref 3.87–5.11)
RDW: 12.9 % (ref 11.5–15.5)
WBC: 11.8 10*3/uL — ABNORMAL HIGH (ref 4.0–10.5)
nRBC: 0 % (ref 0.0–0.2)

## 2020-09-28 LAB — ABO/RH: ABO/RH(D): O NEG

## 2020-09-28 LAB — HEMOGLOBIN AND HEMATOCRIT, BLOOD
HCT: 30.9 % — ABNORMAL LOW (ref 36.0–46.0)
HCT: 27.9 % — ABNORMAL LOW (ref 36.0–46.0)
Hemoglobin: 10 g/dL — ABNORMAL LOW (ref 12.0–15.0)
Hemoglobin: 8.8 g/dL — ABNORMAL LOW (ref 12.0–15.0)

## 2020-09-28 LAB — PROTIME-INR
INR: 1.2 (ref 0.8–1.2)
Prothrombin Time: 14.4 seconds (ref 11.4–15.2)

## 2020-09-28 MED ORDER — SODIUM CHLORIDE 0.9 % IV SOLN
3.0000 g | Freq: Four times a day (QID) | INTRAVENOUS | Status: DC
  Administered 2020-09-28 – 2020-09-29 (×4): 3 g via INTRAVENOUS
  Filled 2020-09-28 (×3): qty 3
  Filled 2020-09-28 (×3): qty 8

## 2020-09-28 MED ORDER — MIDAZOLAM HCL 2 MG/2ML IJ SOLN
INTRAMUSCULAR | Status: AC
  Filled 2020-09-28: qty 4

## 2020-09-28 MED ORDER — FENTANYL CITRATE (PF) 100 MCG/2ML IJ SOLN
INTRAMUSCULAR | Status: AC | PRN
  Administered 2020-09-28: 25 ug via INTRAVENOUS
  Administered 2020-09-28 (×2): 50 ug via INTRAVENOUS

## 2020-09-28 MED ORDER — MIDAZOLAM HCL 2 MG/2ML IJ SOLN
INTRAMUSCULAR | Status: AC | PRN
  Administered 2020-09-28 (×2): 0.5 mg via INTRAVENOUS

## 2020-09-28 MED ORDER — IOHEXOL 300 MG/ML  SOLN: 100.0000 mL | Freq: Once | INTRAMUSCULAR | Status: DC | PRN

## 2020-09-28 MED ORDER — FENTANYL CITRATE (PF) 100 MCG/2ML IJ SOLN
INTRAMUSCULAR | Status: AC | PRN
  Administered 2020-09-28: 50 ug via INTRAVENOUS
  Administered 2020-09-28: 25 ug via INTRAVENOUS

## 2020-09-28 MED ORDER — ONDANSETRON HCL 4 MG/2ML IJ SOLN: 4.0000 mg | Freq: Four times a day (QID) | INTRAMUSCULAR | Status: DC | PRN

## 2020-09-28 MED ORDER — LIDOCAINE HCL 1 % IJ SOLN
INTRAMUSCULAR | Status: AC
  Filled 2020-09-28: qty 20

## 2020-09-28 MED ORDER — FENTANYL CITRATE (PF) 100 MCG/2ML IJ SOLN
INTRAMUSCULAR | Status: AC
  Filled 2020-09-28: qty 2

## 2020-09-28 MED ORDER — MIDAZOLAM HCL 2 MG/2ML IJ SOLN
INTRAMUSCULAR | Status: AC | PRN
  Administered 2020-09-28 (×3): 1 mg via INTRAVENOUS

## 2020-09-28 MED ORDER — ONDANSETRON HCL 4 MG PO TABS: 4.0000 mg | ORAL_TABLET | Freq: Four times a day (QID) | ORAL | Status: DC | PRN

## 2020-09-28 MED ORDER — LIDOCAINE HCL (PF) 1 % IJ SOLN
INTRAMUSCULAR | Status: AC | PRN
  Administered 2020-09-28 (×2): 10 mL via INTRADERMAL

## 2020-09-28 MED ORDER — SODIUM CHLORIDE 0.9 % IV BOLUS
500.0000 mL | Freq: Once | INTRAVENOUS | Status: AC
  Administered 2020-09-28: 500 mL via INTRAVENOUS

## 2020-09-28 MED ORDER — METOPROLOL TARTRATE 5 MG/5ML IV SOLN
5.0000 mg | Freq: Four times a day (QID) | INTRAVENOUS | Status: DC | PRN
Start: 2020-09-28 — End: 2020-09-29

## 2020-09-28 MED ORDER — LACTATED RINGERS IV SOLN: INTRAVENOUS | Status: DC

## 2020-09-28 MED ORDER — SODIUM CHLORIDE 0.9% FLUSH
3.0000 mL | Freq: Two times a day (BID) | INTRAVENOUS | Status: DC
  Administered 2020-09-28 – 2020-09-29 (×2): 3 mL via INTRAVENOUS

## 2020-09-28 MED ORDER — IOHEXOL 350 MG/ML SOLN
100.0000 mL | Freq: Once | INTRAVENOUS | Status: AC | PRN
  Administered 2020-09-28: 100 mL via INTRAVENOUS

## 2020-09-28 NOTE — H&P (Signed)
History and Physical        Hospital Admission Note Date: 09/28/2020  Patient name: Monica Rogers Medical record number: [id number] Date of birth: 1951-05-10 Age: 70 y.o. Gender: female  PCP: Michael Boston, MD  Chief Complaint    Chief Complaint  Patient presents with  . Rectal Bleeding      HPI:   This is a 70 year old female with past medical history of hypertension, hyperlipidemia who presented to the ED with rectal bleeding x1 week.  States that 1 week ago she had small bit of blood per rectum with abdominal pain and was diagnosed with acute sigmoid diverticulitis via CT scan (09/21/2020) and was prescribed Cipro and Flagyl. She has not completed her treatment yet and has 2-3 days of antibiotics left.  Her abdominal pain has since improved and is now minimal however last night she had multiple episodes of bright red blood per rectum with some clots and without much stool.  She has had at least 4-5 episodes.  Feels generally weak and lightheaded and ambulation.  Has not had a colonoscopy.  Not on anticoagulation but does intermittently take excedrin which has aspirin in it, last dose was about a week ago.   ED Course: Afebrile, hemodynamically stable, on room air. Notable Labs: Sodium 134, BUN 16, creatinine 0.74, WBC 11.8, Hb 10.7, platelets 392, COVID-19 pending. Notable Imaging: CTA abdomen pelvis-active diverticular bleeding distal left descending colon in the left lower quadrant with large calcified gallstones.. Patient received 500 cc NS bolus.  GI and IR were consulted by the ED provider   Vitals:   09/28/20 1100 09/28/20 1115  BP: 111/60   Pulse: 95 99  Resp: 18 18  Temp:    SpO2: 97% 97%     Review of Systems:  Review of Systems  Constitutional: Negative for fever.  Respiratory: Negative for shortness of breath.   Gastrointestinal: Positive for nausea. Negative  for vomiting.  All other systems reviewed and are negative.   Medical/Social/Family History   Past Medical History: Past Medical History:  Diagnosis Date  . Hyperlipidemia   . Hypertension     Past Surgical History:  Procedure Laterality Date  . BREAST BIOPSY Right 2019  . FRACTURE SURGERY  2015   hand    Medications: Prior to Admission medications   Medication Sig Start Date End Date Taking? Authorizing Provider  ARMOUR THYROID 120 MG tablet Take 120 mg by mouth daily. 09/13/20  Yes [provider]  aspirin-acetaminophen-caffeine (EXCEDRIN MIGRAINE) [phone] MG tablet Take 2 tablets by mouth every 6 (six) hours as needed for headache.   Yes [provider]  butalbital-aspirin-caffeine Acquanetta Chain) 50-325-40 MG capsule Take 1 capsule by mouth every 4 (four) hours as needed for headache. 08/16/20  Yes [provider]  CALCIUM PO Take 1 tablet by mouth daily.   Yes [provider]  Carboxymethylcellulose Sodium (EYE DROPS OP) Apply 2 drops to eye 2 (two) times daily as needed (dry eyes).   Yes [provider]  CHONDROITIN SULFATE PO Take 1 tablet by mouth daily.   Yes [provider]  ciprofloxacin (CIPRO) 250 MG tablet Take 500 mg by mouth 2 (two) times daily. 7 day supply 09/21/20  Yes [provider]  Digestive Enzymes (ENZYME DIGEST) CAPS Take 2 capsules by mouth daily.   Yes [provider]  estradiol (VIVELLE-DOT) 0.05 MG/24HR patch Place 0.05 mg onto the skin once a week. 08/15/18  Yes [provider]  fluticasone (FLONASE) 50 MCG/ACT nasal spray Place 2 sprays into both nostrils daily. 09/04/18  Yes [provider]  Magnesium 110 MG CAPS Take 2 capsules by mouth 2 (two) times daily.   Yes [provider]  metroNIDAZOLE (FLAGYL) 500 MG tablet Take 500 mg by mouth 3 (three) times daily. 10 day supply 09/21/20  Yes [provider]  olmesartan (BENICAR) 20 MG tablet Take 20 mg by  mouth daily. 09/17/20  Yes [provider]  Omega-3 Fatty Acids (FISH OIL PO) Take 1 tablet by mouth 2 (two) times daily.   Yes [provider]  OVER THE COUNTER MEDICATION Take 1 tablet by mouth daily. Bone up bone supplement   Yes [provider]  progesterone (PROMETRIUM) 200 MG capsule Take 200 mg by mouth at bedtime. 08/13/18  Yes [provider]  rosuvastatin (CRESTOR) 10 MG tablet Take 10 mg by mouth daily. 09/17/20  Yes [provider]  Thiamine HCl (VITAMIN B-1 PO) Take 1 tablet by mouth daily.   Yes [provider]  VITAMIN D, CHOLECALCIFEROL, PO Take 10,000 Units by mouth daily.    Yes [provider]  Zinc Sulfate 66 MG TABS Take 132 mg by mouth daily.   Yes [provider]    Allergies:  No Known Allergies  Social History:  reports that she has never smoked. She has never used smokeless tobacco. She reports that she does not drink alcohol and does not use drugs.  Family History: Family History  Problem Relation Age of Onset  . Hearing loss Mother   . Miscarriages / [place] Mother   . Alcohol abuse Father   . Cancer Father   . Hearing loss Father      Objective   Physical Exam: Blood pressure 111/60, pulse 99, temperature 97.8 F (36.6 C), temperature source Oral, resp. rate 18, SpO2 97 %.  Physical Exam Vitals and nursing note reviewed.  Constitutional:      Appearance: Normal appearance.  HENT:     Head: Normocephalic and atraumatic.  Eyes:     Conjunctiva/sclera: Conjunctivae normal.  Cardiovascular:     Rate and Rhythm: Normal rate and regular rhythm.  Pulmonary:     Effort: Pulmonary effort is normal.     Breath sounds: Normal breath sounds.  Abdominal:     General: Abdomen is flat.     Palpations: Abdomen is soft.  Musculoskeletal:        General: No swelling or tenderness.  Skin:    Coloration: Skin is not jaundiced or pale.  Neurological:     Mental Status: She is alert.  Mental status is at baseline.  Psychiatric:        Mood and Affect: Mood normal.        Behavior: Behavior normal.     LABS on Admission: I have personally reviewed all the labs and imaging below    Basic Metabolic Panel: Recent Labs  Lab 09/28/20 0905  NA 134*  K 3.9  CL 102  CO2 23  GLUCOSE 160*  BUN 16  CREATININE 0.74  CALCIUM 8.8*   Liver Function Tests: Recent Labs  Lab 09/28/20 0905  AST 35  ALT 43  ALKPHOS 44  BILITOT 0.5  PROT  6.6  ALBUMIN 3.7   No results for input(s): LIPASE, AMYLASE in the last 168 hours. No results for input(s): AMMONIA in the last 168 hours. CBC: Recent Labs  Lab 09/28/20 0905  WBC 11.8*  HGB 10.7*  HCT 33.5*  MCV 100.0  PLT 392   Cardiac Enzymes: No results for input(s): CKTOTAL, CKMB, CKMBINDEX, TROPONINI in the last 168 hours. BNP: Invalid input(s): POCBNP CBG: No results for input(s): GLUCAP in the last 168 hours.  Radiological Exams on Admission:  CT Angio Abd/Pel W and/or Wo Contrast  Result Date: 09/28/2020 CLINICAL DATA:  Acute bright red blood per rectum, lower GI bleed, concern for diverticular source EXAM: CT ANGIOGRAPHY ABDOMEN AND PELVIS WITH CONTRAST AND WITHOUT CONTRAST TECHNIQUE: Multidetector CT imaging of the abdomen and pelvis was performed using the standard protocol during bolus administration of intravenous contrast. Multiplanar reconstructed images and MIPs were obtained and reviewed to evaluate the vascular anatomy. CONTRAST:  190mL OMNIPAQUE IOHEXOL 350 MG/ML SOLN COMPARISON:  None. FINDINGS: VASCULAR Aorta: Aorta atherosclerotic. Negative for aneurysm or dissection. No retroperitoneal hemorrhage or hematoma. No occlusive disease. Celiac: Widely patent including its branches SMA: Widely patent including its branches. Replaced right hepatic artery off the SMA. Renals: Widely patent main renal arteries and accessory left renal artery. IMA: IMA is patent off the distal aorta. Within the left descending colon,  there is active arterial contrast extravasation on the arterial phase as well as the delayed imaging compatible with an acute diverticular bleed. Inflow: Minor iliac atherosclerosis without inflow disease or occlusion. Common, internal and external iliac arteries all remain patent. Proximal Outflow: Bilateral common femoral and visualized portions of the superficial and profunda femoral arteries are patent without evidence of aneurysm, dissection, vasculitis or significant stenosis. Veins: No veno-occlusive process. Review of the MIP images confirms the above findings. NON-VASCULAR Lower chest: No acute lower chest finding. Hepatobiliary: No focal hepatic abnormality. No intrahepatic biliary dilatation. Large calcified gallstones noted. Pancreas: Unremarkable. No pancreatic ductal dilatation or surrounding inflammatory changes. Spleen: Normal in size without focal abnormality. Adrenals/Urinary Tract: Normal adrenal glands. Subcentimeter tiny bilateral renal cortical cysts. No other focal renal abnormality. No hydronephrosis or obstruction. Ureters are symmetric and decompressed. No obstructing ureteral calculus. Bladder collapsed. Stomach/Bowel: Negative for bowel obstruction, significant dilatation, ileus, or free air. Retrocecal appendix noted containing air without acute inflammation. No free fluid, fluid collection, abscess, or ascites. Lymphatic: No bulky adenopathy. Reproductive: Uterus and bilateral adnexa are unremarkable. Other: No abdominal wall hernia or abnormality. No abdominopelvic ascites. Musculoskeletal: Degenerative changes of the spine. Lower lumbar facet arthropathy. No acute osseous finding. IMPRESSION: VASCULAR Positive CTA for active diverticular bleed in the distal left descending colon in the left lower quadrant as detailed above. NON-VASCULAR Large calcified gallstones These results were called by telephone at the time of interpretation on 09/28/2020 at 1 p.m. to provider Sherwood Gambler ,  who verbally acknowledged these results. Electronically Signed   By: Jerilynn Mages.  Shick M.D.   On: 09/28/2020 13:11      EKG: Not done   A & P   Principal Problem:   GI bleed Active Problems:   Hypertension   Diverticulitis   1. Acute diverticular bleed a. Afebrile and hemodynamically stable on room air  b. Continues to have bright red blood per rectum in the ED per nurse c. CTA abdomen/pelvix with distal left descending colon active diverticular bleed d. Trend H&H-patient okay with blood transfusion if needed e. GI consulted f. Plan for IR intervention this afternoon  2.  Sigmoid diverticulitis a. Diagnosed 09/21/20 and treated with cipro/metronidazole, has not completed treatment b. Continue with Unasyn for now c. GI consulted   3. Hypertension a. Holding home olmesartan  4. Hyperlipidemia a. Holding home rosuvastatin  5. Hypothyroid a. Holding home med     DVT prophylaxis: SCDs   Code Status: Full Code  Diet: NPO Family Communication: Admission, patients condition and plan of care including tests being ordered have been discussed with the patient who indicates understanding and agrees with the plan and Code Status.  Disposition Plan: The appropriate patient status for this patient is INPATIENT. Inpatient status is judged to be reasonable and necessary in order to provide the required intensity of service to ensure the patient's safety. The patient's presenting symptoms, physical exam findings, and initial radiographic and laboratory data in the context of their chronic comorbidities is felt to place them at high risk for further clinical deterioration. Furthermore, it is not anticipated that the patient will be medically stable for discharge from the hospital within 2 midnights of admission. The following factors support the patient status of inpatient.   " The patient's presenting symptoms include generalized weakness, rectal bleed. " The worrisome physical exam findings  include unremarkable. " The initial radiographic and laboratory data are worrisome because of active diverticular bleed. " The chronic co-morbidities include hypertension, hyperlipidemia.   * I certify that at the point of admission it is my clinical judgment that the patient will require inpatient hospital care spanning beyond 2 midnights from the point of admission due to high intensity of service, high risk for further deterioration and high frequency of surveillance required.*     Consultants  . IR . GI  Procedures  . None  Time Spent on Admission: 66 minutes    Harold Hedge, DO [facility]  09/28/2020, 2:08 PM

## 2020-09-28 NOTE — Progress Notes (Signed)
   09/28/20 1629  Vitals  Temp 98.7 F (37.1 C)  Temp Source Oral  BP 131/73  MAP (mmHg) 87  BP Location Right Arm  BP Method Automatic  Patient Position (if appropriate) Lying  Pulse Rate (!) 103  Pulse Rate Source Monitor  Resp 20  MEWS COLOR  MEWS Score Color Yellow  Patient arrived to [address], on bed, lying flat. Patient alert and oriented x 4. RR even and unlabored, per orders, patient instructed to be on bed rest until 2000. Pulses are palpable per doppler but not by hand palpation. Marked with permanent marker for oncoming nurse.   Right groin site, clean dry and intact. Patient denies, dizziness. Placed on pure-wick until able to ambulate. No other needs identified. Sent medication to pharmacy for Unasyn to be sent, per pharmacy it would be tubed within the next hour. Started on IV fluids per MAR. Will continue to monitor.
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
## 2020-09-28 NOTE — Procedures (Signed)
Pre-procedure Diagnosis: Lower GI Bleeding; Positive CTA Post-procedure Diagnosis: Same  Post mesenteric arteriogram and percutaneous coil embolization of bleeding distal arcade of the left colic artery.    Complications: None Immediate EBL: None  Keep right leg straight for 4 hrs (until 2030)  Signed: Sandi Mariscal Pager: [phone] 09/28/2020, 4:26 PM

## 2020-09-28 NOTE — Consult Note (Signed)
Referring Provider: [facility] Primary Care Physician:  Michael Boston, MD Primary Gastroenterologist:  Althia Forts  Reason for Consultation:  Hematochezia  HPI: Monica Rogers is a 70 y.o. female with history of diverticulitis (currently on Cipro/Flagyl) presenting for consultation of hematochezia.  Patient states she started having left lower quadrant abdominal pain was seen by her PCP.  She had a CT scan on 09/21/2020 which revealed acute sigmoid diverticulitis without perforation or abscess.  She was started on Cipro/Flagyl at that time.  She states on Wednesday 2/16, she passed 1 blood clot and then started having intermittent painless hematochezia since that time.  She states she had a large bloody bowel movement this morning and thus presented to the ED.  CT-A revealed active diverticular bleeding in the distal left descending colon.  She states her LLQ abdominal pain has improved, though mild discomfort persists.  Takes Excedrin migraine (aspirin-containing) PRN. Denies blood thinner use.  She has never had a colonoscopy. She reports negative Cologuard a few months ago. Denies family history of colon cancer or gastrointestinal malignancy.  Past Medical History:  Diagnosis Date  . Hyperlipidemia   . Hypertension     Past Surgical History:  Procedure Laterality Date  . BREAST BIOPSY Right 2019  . FRACTURE SURGERY  2015   hand    Prior to Admission medications   Medication Sig Start Date End Date Taking? Authorizing Provider  ARMOUR THYROID 120 MG tablet Take 120 mg by mouth daily. 09/13/20  Yes [provider]  aspirin-acetaminophen-caffeine (EXCEDRIN MIGRAINE) [phone] MG tablet Take 2 tablets by mouth every 6 (six) hours as needed for headache.   Yes [provider]  butalbital-aspirin-caffeine Acquanetta Chain) 50-325-40 MG capsule Take 1 capsule by mouth every 4 (four) hours as needed for headache. 08/16/20  Yes [provider]  CALCIUM PO Take 1 tablet by mouth  daily.   Yes [provider]  Carboxymethylcellulose Sodium (EYE DROPS OP) Apply 2 drops to eye 2 (two) times daily as needed (dry eyes).   Yes [provider]  CHONDROITIN SULFATE PO Take 1 tablet by mouth daily.   Yes [provider]  ciprofloxacin (CIPRO) 250 MG tablet Take 500 mg by mouth 2 (two) times daily. 7 day supply 09/21/20  Yes [provider]  Digestive Enzymes (ENZYME DIGEST) CAPS Take 2 capsules by mouth daily.   Yes [provider]  estradiol (VIVELLE-DOT) 0.05 MG/24HR patch Place 0.05 mg onto the skin once a week. 08/15/18  Yes [provider]  fluticasone (FLONASE) 50 MCG/ACT nasal spray Place 2 sprays into both nostrils daily. 09/04/18  Yes [provider]  Magnesium 110 MG CAPS Take 2 capsules by mouth 2 (two) times daily.   Yes [provider]  metroNIDAZOLE (FLAGYL) 500 MG tablet Take 500 mg by mouth 3 (three) times daily. 10 day supply 09/21/20  Yes [provider]  olmesartan (BENICAR) 20 MG tablet Take 20 mg by mouth daily. 09/17/20  Yes [provider]  Omega-3 Fatty Acids (FISH OIL PO) Take 1 tablet by mouth 2 (two) times daily.   Yes [provider]  OVER THE COUNTER MEDICATION Take 1 tablet by mouth daily. Bone up bone supplement   Yes [provider]  progesterone (PROMETRIUM) 200 MG capsule Take 200 mg by mouth at bedtime. 08/13/18  Yes [provider]  rosuvastatin (CRESTOR) 10 MG tablet Take 10 mg by mouth daily. 09/17/20  Yes [provider]  Thiamine HCl (VITAMIN B-1 PO) Take 1  tablet by mouth daily.   Yes [provider]  VITAMIN D, CHOLECALCIFEROL, PO Take 10,000 Units by mouth daily.    Yes [provider]  Zinc Sulfate 66 MG TABS Take 132 mg by mouth daily.   Yes [provider]    Scheduled Meds: . fentaNYL      . lidocaine      . midazolam      . sodium chloride flush  3 mL Intravenous Q12H   Continuous  Infusions: . ampicillin-sulbactam (UNASYN) IV    . lactated ringers     PRN Meds:.metoprolol tartrate, ondansetron **OR** ondansetron (ZOFRAN) IV  Allergies as of 09/28/2020  . (No Known Allergies)    Family History  Problem Relation Age of Onset  . Hearing loss Mother   . Miscarriages / [place] Mother   . Alcohol abuse Father   . Cancer Father   . Hearing loss Father     Social History   Socioeconomic History  . Marital status: Married    Spouse name: Not on file  . Number of children: Not on file  . Years of education: Not on file  . Highest education level: Not on file  Occupational History  . Not on file  Tobacco Use  . Smoking status: Never Smoker  . Smokeless tobacco: Never Used  Substance and Sexual Activity  . Alcohol use: No  . Drug use: No  . Sexual activity: Yes    Birth control/protection: None  Other Topics Concern  . Not on file  Social History Narrative  . Not on file   Social Determinants of Health   Financial Resource Strain: Not on file  Food Insecurity: Not on file  Transportation Needs: Not on file  Physical Activity: Not on file  Stress: Not on file  Social Connections: Not on file  Intimate Partner Violence: Not on file    Review of Systems: Review of Systems  Constitutional: Negative for chills and fever.  HENT: Negative for hearing loss and tinnitus.   Eyes: Negative for pain and redness.  Respiratory: Negative for cough and shortness of breath.   Cardiovascular: Negative for chest pain and palpitations.  Gastrointestinal: Positive for abdominal pain, blood in stool and diarrhea. Negative for constipation, heartburn, melena, nausea and vomiting.  Genitourinary: Negative for flank pain and hematuria.  Musculoskeletal: Negative for falls and joint pain.  Skin: Negative for itching and rash.  Neurological: Negative for seizures and loss of consciousness.  Endo/Heme/Allergies: Negative for polydipsia. Does not bruise/bleed  easily.  Psychiatric/Behavioral: Negative for memory loss. The patient is not nervous/anxious.      Physical Exam: Vital signs: Vitals:   09/28/20 1100 09/28/20 1115  BP: 111/60   Pulse: 95 99  Resp: 18 18  Temp:    SpO2: 97% 97%      Physical Exam Vitals reviewed.  Constitutional:      General: She is not in acute distress. HENT:     Head: Normocephalic and atraumatic.     Nose: Nose normal. No congestion.     Mouth/Throat:     Mouth: Mucous membranes are moist.     Pharynx: Oropharynx is clear.  Eyes:     Extraocular Movements: Extraocular movements intact.     Conjunctiva/sclera: Conjunctivae normal.  Cardiovascular:     Rate and Rhythm: Normal rate and regular rhythm.     Pulses: Normal pulses.  Pulmonary:     Effort: Pulmonary effort is normal. No respiratory distress.  Breath sounds: Normal breath sounds.  Abdominal:     General: Bowel sounds are normal. There is no distension.     Palpations: Abdomen is soft. There is no mass.     Tenderness: There is abdominal tenderness (mild, LLQ). There is no guarding or rebound.     Hernia: No hernia is present.  Musculoskeletal:        General: No swelling or tenderness.     Cervical back: Normal range of motion and neck supple.  Skin:    General: Skin is warm and dry.  Neurological:     General: No focal deficit present.     Mental Status: She is alert and oriented to person, place, and time.  Psychiatric:        Mood and Affect: Mood normal.        Behavior: Behavior is cooperative.      GI:  Lab Results: Recent Labs    09/28/20 0905  WBC 11.8*  HGB 10.7*  HCT 33.5*  PLT 392   BMET Recent Labs    09/28/20 0905  NA 134*  K 3.9  CL 102  CO2 23  GLUCOSE 160*  BUN 16  CREATININE 0.74  CALCIUM 8.8*   LFT Recent Labs    09/28/20 0905  PROT 6.6  ALBUMIN 3.7  AST 35  ALT 43  ALKPHOS 44  BILITOT 0.5   PT/INR Recent Labs    09/28/20 0900  LABPROT 14.4  INR 1.2      Studies/Results: CT Angio Abd/Pel W and/or Wo Contrast  Result Date: 09/28/2020 CLINICAL DATA:  Acute bright red blood per rectum, lower GI bleed, concern for diverticular source EXAM: CT ANGIOGRAPHY ABDOMEN AND PELVIS WITH CONTRAST AND WITHOUT CONTRAST TECHNIQUE: Multidetector CT imaging of the abdomen and pelvis was performed using the standard protocol during bolus administration of intravenous contrast. Multiplanar reconstructed images and MIPs were obtained and reviewed to evaluate the vascular anatomy. CONTRAST:  126mL OMNIPAQUE IOHEXOL 350 MG/ML SOLN COMPARISON:  None. FINDINGS: VASCULAR Aorta: Aorta atherosclerotic. Negative for aneurysm or dissection. No retroperitoneal hemorrhage or hematoma. No occlusive disease. Celiac: Widely patent including its branches SMA: Widely patent including its branches. Replaced right hepatic artery off the SMA. Renals: Widely patent main renal arteries and accessory left renal artery. IMA: IMA is patent off the distal aorta. Within the left descending colon, there is active arterial contrast extravasation on the arterial phase as well as the delayed imaging compatible with an acute diverticular bleed. Inflow: Minor iliac atherosclerosis without inflow disease or occlusion. Common, internal and external iliac arteries all remain patent. Proximal Outflow: Bilateral common femoral and visualized portions of the superficial and profunda femoral arteries are patent without evidence of aneurysm, dissection, vasculitis or significant stenosis. Veins: No veno-occlusive process. Review of the MIP images confirms the above findings. NON-VASCULAR Lower chest: No acute lower chest finding. Hepatobiliary: No focal hepatic abnormality. No intrahepatic biliary dilatation. Large calcified gallstones noted. Pancreas: Unremarkable. No pancreatic ductal dilatation or surrounding inflammatory changes. Spleen: Normal in size without focal abnormality. Adrenals/Urinary Tract: Normal  adrenal glands. Subcentimeter tiny bilateral renal cortical cysts. No other focal renal abnormality. No hydronephrosis or obstruction. Ureters are symmetric and decompressed. No obstructing ureteral calculus. Bladder collapsed. Stomach/Bowel: Negative for bowel obstruction, significant dilatation, ileus, or free air. Retrocecal appendix noted containing air without acute inflammation. No free fluid, fluid collection, abscess, or ascites. Lymphatic: No bulky adenopathy. Reproductive: Uterus and bilateral adnexa are unremarkable. Other: No abdominal wall hernia or abnormality. No  abdominopelvic ascites. Musculoskeletal: Degenerative changes of the spine. Lower lumbar facet arthropathy. No acute osseous finding. IMPRESSION: VASCULAR Positive CTA for active diverticular bleed in the distal left descending colon in the left lower quadrant as detailed above. NON-VASCULAR Large calcified gallstones These results were called by telephone at the time of interpretation on 09/28/2020 at 1 p.m. to provider Sherwood Gambler , who verbally acknowledged these results. Electronically Signed   By: Jerilynn Mages.  Shick M.D.   On: 09/28/2020 13:11    Impression: Diverticular bleeding: CT-A today revealed active diverticular bleeding in the distal left descending colon -Hgb 10.7 -Normal INR (1.2)  Recent diverticulitis 09/21/20, on Cipro/Flagyl  Plan: IR consulted with plans for embolization today.  Recommend continued antibiotic therapy for diverticulitis, as patient has only had 7 days of therapy thus far.  Continue to monitor H&H with transfusion as needed to maintain Hgb >7.  [facility] will follow.   LOS: 0 days   [name]  PA-C 09/28/2020, 2:33 PM  Contact #  [phone]

## 2020-09-28 NOTE — ED Provider Notes (Signed)
[facility] DEPT Provider Note   CSN: [id number] Arrival date & time: 09/28/20  0818     History Chief Complaint  Patient presents with  . Rectal Bleeding    Monica Rogers is a 70 y.o. female.  HPI 70 year old female presents with rectal bleeding.  1 week ago she started having a little bit of blood per rectum and some abdominal pain and was diagnosed with diverticulitis via CT scan.  Has been on Cipro and Flagyl and her abdominal pain has significantly improved and is now minimal.  However since last night she has had multiple episodes of rectal bleeding without much stool.  Has had at least 4-5 episodes.  She is feeling weak and lightheaded.  She is not on any blood thinners.  No chest pain/dyspnea.   Past Medical History:  Diagnosis Date  . Hyperlipidemia   . Hypertension     Patient Active Problem List   Diagnosis Date Noted  . GI bleed 09/28/2020  . Diverticulitis 09/28/2020  . Hypertension   . KNEE PAIN, RIGHT 11/18/2009  . ANKLE PAIN, RIGHT 11/18/2009  . 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 PLANTAR FASCIITIS, LEFT 11/18/2009  . HYPERTHYROIDISM 04/23/2008  . HYPERLIPIDEMIA 04/23/2008  . OVERWEIGHT 04/23/2008  . ANXIETY 04/23/2008  . HEADACHE, CHRONIC 04/23/2008  . NONSPECIFIC ABNORMAL RESULTS LIVR FUNCTION STUDY 04/23/2008  . ANEMIA, DEFICIENCY, HX OF 04/23/2008    Past Surgical History:  Procedure Laterality Date  . BREAST BIOPSY Right 2019  . FRACTURE SURGERY  2015   hand     OB History   No obstetric history on file.     Family History  Problem Relation Age of Onset  . Hearing loss Mother   . Miscarriages / [place] Mother   . Alcohol abuse Father   . Cancer Father   . Hearing loss Father     Social History   Tobacco Use  . Smoking status: Never Smoker  . Smokeless tobacco: Never Used  Substance Use Topics  . Alcohol use: No  . Drug use: No    Home Medications Prior to Admission  medications   Medication Sig Start Date End Date Taking? Authorizing Provider  ARMOUR THYROID 120 MG tablet Take 120 mg by mouth daily. 09/13/20  Yes [provider]  aspirin-acetaminophen-caffeine (EXCEDRIN MIGRAINE) [phone] MG tablet Take 2 tablets by mouth every 6 (six) hours as needed for headache.   Yes [provider]  butalbital-aspirin-caffeine Acquanetta Chain) 50-325-40 MG capsule Take 1 capsule by mouth every 4 (four) hours as needed for headache. 08/16/20  Yes [provider]  CALCIUM PO Take 1 tablet by mouth daily.   Yes [provider]  Carboxymethylcellulose Sodium (EYE DROPS OP) Apply 2 drops to eye 2 (two) times daily as needed (dry eyes).   Yes [provider]  CHONDROITIN SULFATE PO Take 1 tablet by mouth daily.   Yes [provider]  ciprofloxacin (CIPRO) 250 MG tablet Take 500 mg by mouth 2 (two) times daily. 7 day supply 09/21/20  Yes [provider]  Digestive Enzymes (ENZYME DIGEST) CAPS Take 2 capsules by mouth daily.   Yes [provider]  estradiol (VIVELLE-DOT) 0.05 MG/24HR patch Place 0.05 mg onto the skin once a week. 08/15/18  Yes [provider]  fluticasone (FLONASE) 50 MCG/ACT nasal spray Place 2 sprays into both nostrils daily. 09/04/18  Yes [provider]  Magnesium 110 MG CAPS Take 2 capsules by mouth 2 (two) times daily.  Yes [provider]  metroNIDAZOLE (FLAGYL) 500 MG tablet Take 500 mg by mouth 3 (three) times daily. 10 day supply 09/21/20  Yes [provider]  olmesartan (BENICAR) 20 MG tablet Take 20 mg by mouth daily. 09/17/20  Yes [provider]  Omega-3 Fatty Acids (FISH OIL PO) Take 1 tablet by mouth 2 (two) times daily.   Yes [provider]  OVER THE COUNTER MEDICATION Take 1 tablet by mouth daily. Bone up bone supplement   Yes [provider]  progesterone (PROMETRIUM) 200 MG capsule Take 200 mg by mouth at bedtime. 08/13/18   Yes [provider]  rosuvastatin (CRESTOR) 10 MG tablet Take 10 mg by mouth daily. 09/17/20  Yes [provider]  Thiamine HCl (VITAMIN B-1 PO) Take 1 tablet by mouth daily.   Yes [provider]  VITAMIN D, CHOLECALCIFEROL, PO Take 10,000 Units by mouth daily.    Yes [provider]  Zinc Sulfate 66 MG TABS Take 132 mg by mouth daily.   Yes [provider]    Allergies    Patient has no known allergies.  Review of Systems   Review of Systems  Respiratory: Negative for shortness of breath.   Cardiovascular: Negative for chest pain.  Gastrointestinal: Positive for abdominal pain and blood in stool. Negative for rectal pain.  Neurological: Positive for weakness.  All other systems reviewed and are negative.   Physical Exam Updated Vital Signs BP 120/70   Pulse (!) 102   Temp 97.8 F (36.6 C) (Oral)   Resp 19   SpO2 100%   Physical Exam Vitals and nursing note reviewed. Exam conducted with a chaperone present.  Constitutional:      Appearance: She is well-developed and well-nourished.  HENT:     Head: Normocephalic and atraumatic.     Right Ear: External ear normal.     Left Ear: External ear normal.     Nose: Nose normal.  Eyes:     General:        Right eye: No discharge.        Left eye: No discharge.  Cardiovascular:     Rate and Rhythm: Normal rate and regular rhythm.     Heart sounds: Normal heart sounds.  Pulmonary:     Effort: Pulmonary effort is normal.     Breath sounds: Normal breath sounds.  Abdominal:     Palpations: Abdomen is soft.     Tenderness: There is abdominal tenderness (mild) in the left lower quadrant.  Genitourinary:    Comments: Small amount of bright red blood on DRE. No masses Skin:    General: Skin is warm and dry.  Neurological:     Mental Status: She is alert.  Psychiatric:        Mood and Affect: Mood is not anxious.     ED Results / Procedures / Treatments   Labs (all labs ordered  are listed, but only abnormal results are displayed) Labs Reviewed  COMPREHENSIVE METABOLIC PANEL - Abnormal; Notable for the following components:      Result Value   Sodium 134 (*)    Glucose, Bld 160 (*)    Calcium 8.8 (*)    All other components within normal limits  CBC - Abnormal; Notable for the following components:   WBC 11.8 (*)    RBC 3.35 (*)    Hemoglobin 10.7 (*)    HCT 33.5 (*)    All other components within normal limits  HEMOGLOBIN AND HEMATOCRIT, BLOOD - Abnormal; Notable for the following components:   Hemoglobin 10.0 (*)    HCT 30.9 (*)    All other components within normal limits  RESP PANEL BY RT-PCR (FLU A&B, COVID) ARPGX2  PROTIME-INR  HEMOGLOBIN AND HEMATOCRIT, BLOOD  HEMOGLOBIN AND HEMATOCRIT, BLOOD  POC OCCULT BLOOD, ED  TYPE AND SCREEN  ABO/RH    EKG None  Radiology CT Angio Abd/Pel W and/or Wo Contrast  Result Date: 09/28/2020 CLINICAL DATA:  Acute bright red blood per rectum, lower GI bleed, concern for diverticular source EXAM: CT ANGIOGRAPHY ABDOMEN AND PELVIS WITH CONTRAST AND WITHOUT CONTRAST TECHNIQUE: Multidetector CT imaging of the abdomen and pelvis was performed using the standard protocol during bolus administration of intravenous contrast. Multiplanar reconstructed images and MIPs were obtained and reviewed to evaluate the vascular anatomy. CONTRAST:  14mL OMNIPAQUE IOHEXOL 350 MG/ML SOLN COMPARISON:  None. FINDINGS: VASCULAR Aorta: Aorta atherosclerotic. Negative for aneurysm or dissection. No retroperitoneal hemorrhage or hematoma. No occlusive disease. Celiac: Widely patent including its branches SMA: Widely patent including its branches. Replaced right hepatic artery off the SMA. Renals: Widely patent main renal arteries and accessory left renal artery. IMA: IMA is patent off the distal aorta. Within the left descending colon, there is active arterial contrast extravasation on the arterial phase as well as the delayed imaging compatible  with an acute diverticular bleed. Inflow: Minor iliac atherosclerosis without inflow disease or occlusion. Common, internal and external iliac arteries all remain patent. Proximal Outflow: Bilateral common femoral and visualized portions of the superficial and profunda femoral arteries are patent without evidence of aneurysm, dissection, vasculitis or significant stenosis. Veins: No veno-occlusive process. Review of the MIP images confirms the above findings. NON-VASCULAR Lower chest: No acute lower chest finding. Hepatobiliary: No focal hepatic abnormality. No intrahepatic biliary dilatation. Large calcified gallstones noted. Pancreas: Unremarkable. No pancreatic ductal dilatation or surrounding inflammatory changes. Spleen: Normal in size without focal abnormality. Adrenals/Urinary Tract: Normal adrenal glands. Subcentimeter tiny bilateral renal cortical cysts. No other focal renal abnormality. No hydronephrosis or obstruction. Ureters are symmetric and decompressed. No obstructing ureteral calculus. Bladder collapsed. Stomach/Bowel: Negative for bowel obstruction, significant dilatation, ileus, or free air. Retrocecal appendix noted containing air without acute inflammation. No free fluid, fluid collection, abscess, or ascites. Lymphatic: No bulky adenopathy. Reproductive: Uterus and bilateral adnexa are unremarkable. Other: No abdominal wall hernia or abnormality. No abdominopelvic ascites. Musculoskeletal: Degenerative changes of the spine. Lower lumbar facet arthropathy. No acute osseous finding. IMPRESSION: VASCULAR Positive CTA for active diverticular bleed in the distal left descending colon in the left lower quadrant as detailed above. NON-VASCULAR Large calcified gallstones These results were called by telephone at the time of interpretation on 09/28/2020 at 1 p.m. to provider Sherwood Gambler , who verbally acknowledged these results. Electronically Signed   By: Jerilynn Mages.  Shick M.D.   On: 09/28/2020 13:11     Procedures .Critical Care Performed by: Sherwood Gambler, MD Authorized by: Sherwood Gambler, MD   Critical care provider statement:    Critical care time (minutes):  35   Critical care time was exclusive of:  Separately billable procedures and treating other patients   Critical care was necessary to treat or prevent imminent or life-threatening deterioration of the following conditions:  Circulatory failure and shock   Critical care was time spent personally by me on the following activities:  Discussions with consultants, evaluation of patient's response to treatment, examination of patient, ordering and performing treatments and interventions, ordering and review of  laboratory studies, ordering and review of radiographic studies, pulse oximetry, re-evaluation of patient's condition, obtaining history from patient or surrogate and review of old charts     Medications Ordered in ED Medications  lidocaine (XYLOCAINE) 1 % (with pres) injection (has no administration in time range)  sodium chloride flush (NS) 0.9 % injection 3 mL (has no administration in time range)  lactated ringers infusion (has no administration in time range)  ondansetron (ZOFRAN) tablet 4 mg (has no administration in time range)    Or  ondansetron (ZOFRAN) injection 4 mg (has no administration in time range)  metoprolol tartrate (LOPRESSOR) injection 5 mg (has no administration in time range)  Ampicillin-Sulbactam (UNASYN) 3 g in sodium chloride 0.9 % 100 mL IVPB (has no administration in time range)  iohexol (OMNIPAQUE) 300 MG/ML solution 100 mL (has no administration in time range)  iohexol (OMNIPAQUE) 300 MG/ML solution 100 mL (has no administration in time range)  midazolam (VERSED) injection (0.5 mg Intravenous Given 09/28/20 1537)  fentaNYL (SUBLIMAZE) injection (25 mcg Intravenous Given 09/28/20 1525)  sodium chloride 0.9 % bolus 500 mL (0 mLs Intravenous Stopped 09/28/20 1039)  iohexol (OMNIPAQUE) 350 MG/ML  injection 100 mL (100 mLs Intravenous Contrast Given 09/28/20 1220)  midazolam (VERSED) injection (1 mg Intravenous Given 09/28/20 1459)  fentaNYL (SUBLIMAZE) injection (50 mcg Intravenous Given 09/28/20 1454)  lidocaine (PF) (XYLOCAINE) 1 % injection (10 mLs Intradermal Given 09/28/20 1500)    ED Course  I have reviewed the triage vital signs and the nursing notes.  Pertinent labs & imaging results that were available during my care of the patient were reviewed by me and considered in my medical decision making (see chart for details).    MDM Rules/Calculators/A&P                          Patient presents with acute lower GI bleeding.  Is hemodynamically stable, though with concerning output of blood.  Discussed with Dr. Therisa Doyne, who will consult and recommends CT angiography.  This has been personally reviewed and this result does show significant active GI bleeding.  Discussed with Dr. Pascal Lux of radiology who will take to IR.  Fortunately her repeat hemoglobin is okay.  Admit to hospitalist service. Final Clinical Impression(s) / ED Diagnoses Final diagnoses:  Acute GI bleeding    Rx / DC Orders ED Discharge Orders    None       Sherwood Gambler, MD 09/28/20 [phone]

## 2020-09-28 NOTE — ED Triage Notes (Signed)
Pt presents with c/o rectal bleeding for 3 days. Pt reports that the blood is bright red in nature. Pt was diagnosed with diverticulitis approx one week ago.

## 2020-09-28 NOTE — Progress Notes (Signed)
Pharmacy Antibiotic Note  Monica Rogers is a 70 y.o. female admitted on 09/28/2020 with sigmoid diverticulitis.  Pharmacy has been consulted for Unasyn dosing.  Plan: Unasyn 3 gm IV q6h Pharmacy to sign off   Temp (24hrs), Avg:97.8 F (36.6 C), Min:97.8 F (36.6 C), Max:97.8 F (36.6 C)  Recent Labs  Lab 09/28/20 0905  WBC 11.8*  CREATININE 0.74    CrCl cannot be calculated (Unknown ideal weight.).    No Known Allergies  Thank you for allowing pharmacy to be a part of this patients care.  Eudelia Bunch, Pharm.D 09/28/2020 2:11 PM

## 2020-09-28 NOTE — Progress Notes (Signed)
Patient is complaining of having abdominal cramping, she thinks she has to has to have a BM, but endorses, " this is what I felt when I had 5 bloody stools at home." Explained the only way we will know is if you try to have a BM. Patient endorses understanding. Placed on bedpan with minimal movement with Manuela Schwartz NA. Will continue to monitor.

## 2020-09-29 DIAGNOSIS — K922 Gastrointestinal hemorrhage, unspecified: Secondary | ICD-10-CM | POA: Diagnosis not present

## 2020-09-29 DIAGNOSIS — K625 Hemorrhage of anus and rectum: Secondary | ICD-10-CM | POA: Diagnosis not present

## 2020-09-29 DIAGNOSIS — E039 Hypothyroidism, unspecified: Secondary | ICD-10-CM

## 2020-09-29 DIAGNOSIS — D62 Acute posthemorrhagic anemia: Secondary | ICD-10-CM

## 2020-09-29 DIAGNOSIS — Z9889 Other specified postprocedural states: Secondary | ICD-10-CM | POA: Diagnosis not present

## 2020-09-29 DIAGNOSIS — K5731 Diverticulosis of large intestine without perforation or abscess with bleeding: Secondary | ICD-10-CM | POA: Diagnosis not present

## 2020-09-29 LAB — BASIC METABOLIC PANEL
Anion gap: 7 (ref 5–15)
BUN: 13 mg/dL (ref 8–23)
CO2: 24 mmol/L (ref 22–32)
Calcium: 8.4 mg/dL — ABNORMAL LOW (ref 8.9–10.3)
Chloride: 106 mmol/L (ref 98–111)
Creatinine, Ser: 0.63 mg/dL (ref 0.44–1.00)
GFR, Estimated: >60 mL/min (ref >60)
Glucose, Bld: 130 mg/dL — ABNORMAL HIGH (ref 70–99)
Potassium: 4.5 mmol/L (ref 3.5–5.1)
Sodium: 137 mmol/L (ref 135–145)

## 2020-09-29 LAB — CBC
HCT: 25.3 % — ABNORMAL LOW (ref 36.0–46.0)
Hemoglobin: 8.2 g/dL — ABNORMAL LOW (ref 12.0–15.0)
MCH: 32.2 pg (ref 26.0–34.0)
MCHC: 32.4 g/dL (ref 30.0–36.0)
MCV: 99.2 fL (ref 80.0–100.0)
Platelets: 350 10*3/uL (ref 150–400)
RBC: 2.55 MIL/uL — ABNORMAL LOW (ref 3.87–5.11)
RDW: 13.2 % (ref 11.5–15.5)
WBC: 13.3 10*3/uL — ABNORMAL HIGH (ref 4.0–10.5)
nRBC: 0 % (ref 0.0–0.2)

## 2020-09-29 LAB — HEMOGLOBIN AND HEMATOCRIT, BLOOD
HCT: 25.2 % — ABNORMAL LOW (ref 36.0–46.0)
Hemoglobin: 8.1 g/dL — ABNORMAL LOW (ref 12.0–15.0)

## 2020-09-29 MED ORDER — ACETAMINOPHEN 325 MG PO TABS
650.0000 mg | ORAL_TABLET | Freq: Four times a day (QID) | ORAL | Status: DC | PRN
  Administered 2020-09-29: 650 mg via ORAL
  Filled 2020-09-29: qty 2

## 2020-09-29 NOTE — Care Management Obs Status (Signed)
[place] NOTIFICATION   Patient Details  Name: ALBIRDA SHIEL MRN: [id number] Date of Birth: 02-Mar-1951   Medicare Observation Status Notification Given:  Yes    Purcell Mouton, RN 09/29/2020, 1:43 PM

## 2020-09-29 NOTE — Discharge Summary (Signed)
Physician Discharge Summary  Monica Rogers:[id number] DOB: 1951-03-20 DOA: 09/28/2020  PCP: Michael Boston, MD  Admit date: 09/28/2020 Discharge date: 09/29/2020  Admitted From: Home Disposition:  Home  Recommendations for Outpatient Follow-up:  1. Follow up with PCP in 1 week 2. Follow up with GI as scheduled on 2/28  3. Recommend repeat CBC next week to check stability of hemoglobin  Discharge Condition: Stable CODE STATUS: Full code Diet recommendation: Regular diet  Brief/Interim Summary: From H&P by Dr. Neysa Bonito: "This is a 70 year old female with past medical history of hypertension, hyperlipidemia who presented to the ED with rectal bleeding x1 week.  States that 1 week ago she had small bit of blood per rectum with abdominal pain and was diagnosed with acute sigmoid diverticulitis via CT scan (09/21/2020) and was prescribed Cipro and Flagyl. She has not completed her treatment yet and has 2-3 days of antibiotics left.  Her abdominal pain has since improved and is now minimal however last night she had multiple episodes of bright red blood per rectum with some clots and without much stool.  She has had at least 4-5 episodes.  Feels generally weak and lightheaded and ambulation.  Has not had a colonoscopy.  Not on anticoagulation but does intermittently take excedrin which has aspirin in it, last dose was about a week ago."  Patient underwent CTA abdomen pelvis which revealed active diverticular bleeding in the distal left descending colon.  GI and IR consulted.  Patient underwent post mesenteric arteriogram and percutaneous coil embolization of bleeding distal arcade of the left colic artery.  Patient was monitored overnight.  Diet advanced.  Patient was reevaluated by GI and IR prior to discharge home.   Discharge Diagnoses:  Principal Problem:   Lower GI bleed Active Problems:   HLD (hyperlipidemia)   Hypertension   Sigmoid diverticulitis   Hypothyroidism   Acute blood loss  anemia   Discharge Instructions  Discharge Instructions    Call MD for:  difficulty breathing, headache or visual disturbances   Complete by: As directed    Call MD for:  extreme fatigue   Complete by: As directed    Call MD for:  persistant dizziness or light-headedness   Complete by: As directed    Call MD for:  persistant nausea and vomiting   Complete by: As directed    Call MD for:  severe uncontrolled pain   Complete by: As directed    Call MD for:  temperature >100.4   Complete by: As directed    Discharge instructions   Complete by: As directed    You were cared for by a hospitalist during your hospital stay. If you have any questions about your discharge medications or the care you received while you were in the hospital after you are discharged, you can call the unit and ask to speak with the hospitalist on call if the hospitalist that took care of you is not available. Once you are discharged, your primary care physician will handle any further medical issues. Please note that NO REFILLS for any discharge medications will be authorized once you are discharged, as it is imperative that you return to your primary care physician (or establish a relationship with a primary care physician if you do not have one) for your aftercare needs so that they can reassess your need for medications and monitor your lab values.   Increase activity slowly   Complete by: As directed    No wound care  Complete by: As directed      Allergies as of 09/29/2020   No Known Allergies     Medication List    STOP taking these medications   aspirin-acetaminophen-caffeine 250-250-65 MG tablet Commonly known as: EXCEDRIN MIGRAINE   butalbital-aspirin-caffeine 50-325-40 MG capsule Commonly known as: FIORINAL     TAKE these medications   Armour Thyroid 120 MG tablet Generic drug: thyroid Take 120 mg by mouth daily.   CALCIUM PO Take 1 tablet by mouth daily.   CHONDROITIN SULFATE PO Take 1  tablet by mouth daily.   ciprofloxacin 250 MG tablet Commonly known as: CIPRO Take 500 mg by mouth 2 (two) times daily. 7 day supply   Enzyme Digest Caps Take 2 capsules by mouth daily.   estradiol 0.05 MG/24HR patch Commonly known as: VIVELLE-DOT Place 0.05 mg onto the skin once a week.   EYE DROPS OP Apply 2 drops to eye 2 (two) times daily as needed (dry eyes).   FISH OIL PO Take 1 tablet by mouth 2 (two) times daily.   fluticasone 50 MCG/ACT nasal spray Commonly known as: FLONASE Place 2 sprays into both nostrils daily.   Magnesium 110 MG Caps Take 2 capsules by mouth 2 (two) times daily.   metroNIDAZOLE 500 MG tablet Commonly known as: FLAGYL Take 500 mg by mouth 3 (three) times daily. 10 day supply   olmesartan 20 MG tablet Commonly known as: BENICAR Take 20 mg by mouth daily.   OVER THE COUNTER MEDICATION Take 1 tablet by mouth daily. Bone up bone supplement   progesterone 200 MG capsule Commonly known as: PROMETRIUM Take 200 mg by mouth at bedtime.   rosuvastatin 10 MG tablet Commonly known as: CRESTOR Take 10 mg by mouth daily.   VITAMIN B-1 PO Take 1 tablet by mouth daily.   VITAMIN D (CHOLECALCIFEROL) PO Take 10,000 Units by mouth daily.   Zinc Sulfate 66 MG Tabs Take 132 mg by mouth daily.       Follow-up Information    Michael Boston, MD. Schedule an appointment as soon as possible for a visit in 1 week(s).   Specialty: Internal Medicine Why: Repeat CBC  Contact information: [address] [place] [address] [phone]        Mauri Pole, MD. Go on 10/03/2020.   Specialty: Gastroenterology Contact information: [place] [address] [phone]              No Known Allergies  Consultations:  GI  IR    Procedures/Studies: CT ABDOMEN PELVIS W CONTRAST  Result Date: 09/21/2020 CLINICAL DATA:  Lower pelvic pain since Friday EXAM: CT ABDOMEN AND PELVIS WITH CONTRAST TECHNIQUE:  Multidetector CT imaging of the abdomen and pelvis was performed using the standard protocol following bolus administration of intravenous contrast. Sagittal and coronal MPR images reconstructed from axial data set. Creatinine was obtained on site at [facility] at [address] Results: Creatinine 0.7 mg/dL.  GFR 92 mL/minute CONTRAST:  156m ISOVUE-300 IOPAMIDOL (ISOVUE-300) INJECTION 61% IV. No oral contrast. COMPARISON:  None FINDINGS: Lower chest: Lung bases clear Hepatobiliary: Large calcified gallstones in gallbladder. Liver unremarkable. Pancreas: Normal appearance Spleen: Normal appearance Adrenals/Urinary Tract: Adrenal glands normal appearance. Tiny cysts in each kidney. No additional renal mass, hydronephrosis, hydroureter, or urinary tract calcification. Bladder contains minimal urine. Stomach/Bowel: Normal appendix, extending perihepatic. Diffuse colonic diverticulosis. Colonic wall thickening and pericolic inflammatory changes at proximal to mid sigmoid colon consistent with acute diverticulitis. No  extraluminal gas or abscess. Stomach and remaining bowel loops normal appearance. Vascular/Lymphatic: Atherosclerotic calcifications aorta and iliac arteries without aneurysm. No adenopathy. Reproductive: Unremarkable uterus and adnexa Other: No free air or free fluid. Small umbilical hernia containing fat. Musculoskeletal: Osseous demineralization. Sclerosis at pubic symphysis. Mild grade 1 anterolisthesis L4-L5 due to degenerative disc and facet disease. IMPRESSION: Acute sigmoid diverticulitis without evidence of perforation or abscess. Cholelithiasis. Small umbilical hernia containing fat. Aortic Atherosclerosis (ICD10-I70.0). These results will be called to the ordering clinician or representative by the Radiologist Assistant, and communication documented in the PACS or [employer]. Electronically Signed   By: Lavonia Dana M.D.   On: 09/21/2020 13:33   IR Angiogram Visceral  Selective  Result Date: 09/28/2020 INDICATION: Acute lower GI bleeding with positive CTA. Patient presents for mesenteric arteriogram potential percutaneous embolization. EXAM: 1. ULTRASOUND GUIDANCE FOR ARTERIAL ACCESS 2. SELECTIVE SUPERIOR MESENTERIC ARTERIOGRAM 3. SELECTIVE INFERIOR MESENTERIC ARTERIOGRAM 4. SUB SELECTIVE ARTERIOGRAM OF THE LEFT COLIC ARTERY 5. SUB SELECTIVE ARTERIOGRAM MARGINAL ARTERY AND PERCUTANEOUS COIL EMBOLIZATION OF THE SEGMENT SUPPLYING THE ORIGIN OF THE ARCADE DEMONSTRATING ACTIVE EXTRAVASATION 6. SUB SELECTIVE ARTERIOGRAM AND PERCUTANEOUS COIL EMBOLIZATION OF DISTAL ARCADE SUPPLYING AREA OF EXTRAVASATION WITHIN THE DESCENDING COLON COMPARISON:  CTA abdomen and pelvis-earlier same day MEDICATIONS: None ANESTHESIA/SEDATION: Moderate (conscious) sedation was employed during this procedure. A total of Versed 4 mg and Fentanyl 150 mcg was administered intravenously. Moderate Sedation Time: 61 minutes. The patient's level of consciousness and vital signs were monitored continuously by radiology nursing throughout the procedure under my direct supervision. CONTRAST:  70 cc Omnipaque 300 FLUOROSCOPY TIME:  15 minutes, 12 seconds (7,341 mGy) COMPLICATIONS: None immediate. PROCEDURE: Informed consent was obtained from the patient following explanation of the procedure, risks, benefits and alternatives. All questions were addressed. A time out was performed prior to the initiation of the procedure. Maximal barrier sterile technique utilized including caps, mask, sterile gowns, sterile gloves, large sterile drape, hand hygiene, and Betadine prep. The right femoral head was marked fluoroscopically. Under sterile conditions and local anesthesia, the right common femoral artery access was performed with a micropuncture needle. Under direct ultrasound guidance, the right common femoral was accessed with a micropuncture kit. An ultrasound image was saved for documentation purposes. This allowed for  placement of a 5-French vascular sheath. A limited arteriogram was performed through the side arm of the sheath confirming appropriate access within the right common femoral artery. Over a Bentson wire, a Mickelson catheter was advanced the caudal aspect of the thoracic aorta where was reformed, back bled and flushed. The Mickelson catheter was then utilized to select the superior mesenteric artery and a selective superior mesenteric arteriogram was performed. Mickelson catheter was then utilized to select the IMA and a selective inferior mesenteric arteriogram was performed Next, with the use of a fathom 14 microwire, a regular Renegade microcatheter was advanced into the left colic artery and a selective left colic arteriogram was performed. The microcatheter was then advanced to select the marginal artery and a selective marginal artery arteriogram was performed. The segment of the marginal artery distal to the origin of the arcade supplying the ill-defined area of extravasation within the descending colon was then percutaneously coil embolized with several overlapping 2 mm diameter interlock coils. The tiny arcade supplying the ill-defined area of extravasation within the descending colon was able to be cannulated with the microwire, however despite prolonged efforts, the microcatheter could NOT be advanced into the vessel given the small size of the vessel as  well as associated spasm. Regardless, the origin of the vessel was successfully percutaneously coil embolized with a 2 mm diameter interlock coil. The supplying/upstream segment of the marginal artery was then percutaneously coil embolized with a 3 mm diameter interlock coils. The microcatheter was retracted centrally and completion arteriograms were performed. Images were reviewed and the procedure was terminated. All wires, catheters and sheaths were removed from the patient. Hemostasis was achieved at the right groin access site with manual compression.  The patient tolerated the procedure well without immediate post procedural complication. 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 Otherwise, conventional branching pattern. There are no discrete areas of vessel irregularity or intraluminal extravasation. Selective inferior mesenteric arteriogram demonstrates a conventional branching pattern with ill-defined area of extravasation involving the mid/distal aspect of the descending colon (only seen in hind site). While selective left colic arteriograms were negative for discrete areas intraluminal contrast extravasation, sub selective injection of the marginal artery supplying the caudal division of the IMA demonstrates an ill-defined area of intraluminal contrast extravasation within the distal descending colon via a tiny distal arcade vessel compatible with the findings on preceding CTA. While the vessel was able to be successfully catheterized, the microcatheter was could not be advanced into the vessel given the small size of the vessel and associated vasospasm in the setting of active extravasation. As such, the origin of the arcade as well as the segment of the marginal artery both proximal and distal to the origin of the arcade was successfully percutaneously coil embolized with overlapping interlock coils. Completion arteriogram demonstrate a technically excellent result with no definitive residual collateral arterial supply to the ill-defined area of extravasation involving the distal descending colon. IMPRESSION: Technically successful percutaneous coil embolization the origin of a tiny distal arcade and the adjacent short segment of the marginal artery supplying an ill-defined area of intraluminal contrast extravasation within the distal descending colon correlating with the findings seen on preceding CTA. PLAN: - The patient is to remain  flat for 4 hours with right leg straight. - The patient will continue to experience several additional bloody bowel movements and may continue to require additional resuscitation (as she was bleeding both before and during the procedure), however ultimately I am hopeful she will stabilize in the coming days. - While presumably secondary to diverticular disease, if not recently performed, further evaluation with colonoscopy after the resolution of acute symptoms is advised to exclude the presence of an underlying mass/lesion. Electronically Signed   By: Sandi Mariscal M.D.   On: 09/28/2020 17:45   IR Angiogram Visceral Selective  Result Date: 09/28/2020 INDICATION: Acute lower GI bleeding with positive CTA. Patient presents for mesenteric arteriogram potential percutaneous embolization. EXAM: 1. ULTRASOUND GUIDANCE FOR ARTERIAL ACCESS 2. SELECTIVE SUPERIOR MESENTERIC ARTERIOGRAM 3. SELECTIVE INFERIOR MESENTERIC ARTERIOGRAM 4. SUB SELECTIVE ARTERIOGRAM OF THE LEFT COLIC ARTERY 5. SUB SELECTIVE ARTERIOGRAM MARGINAL ARTERY AND PERCUTANEOUS COIL EMBOLIZATION OF THE SEGMENT SUPPLYING THE ORIGIN OF THE ARCADE DEMONSTRATING ACTIVE EXTRAVASATION 6. SUB SELECTIVE ARTERIOGRAM AND PERCUTANEOUS COIL EMBOLIZATION OF DISTAL ARCADE SUPPLYING AREA OF EXTRAVASATION WITHIN THE DESCENDING COLON COMPARISON:  CTA abdomen and pelvis-earlier same day MEDICATIONS: None ANESTHESIA/SEDATION: Moderate (conscious) sedation was employed during this procedure. A total of Versed 4 mg and Fentanyl 150 mcg was administered intravenously. Moderate Sedation Time: 61 minutes. The patient's level of consciousness and vital signs were monitored continuously by radiology nursing throughout the procedure under my direct supervision.  CONTRAST:  70 cc Omnipaque 300 FLUOROSCOPY TIME:  15 minutes, 12 seconds (3,428 mGy) COMPLICATIONS: None immediate. PROCEDURE: Informed consent was obtained from the patient following explanation of the procedure, risks,  benefits and alternatives. All questions were addressed. A time out was performed prior to the initiation of the procedure. Maximal barrier sterile technique utilized including caps, mask, sterile gowns, sterile gloves, large sterile drape, hand hygiene, and Betadine prep. The right femoral head was marked fluoroscopically. Under sterile conditions and local anesthesia, the right common femoral artery access was performed with a micropuncture needle. Under direct ultrasound guidance, the right common femoral was accessed with a micropuncture kit. An ultrasound image was saved for documentation purposes. This allowed for placement of a 5-French vascular sheath. A limited arteriogram was performed through the side arm of the sheath confirming appropriate access within the right common femoral artery. Over a Bentson wire, a Mickelson catheter was advanced the caudal aspect of the thoracic aorta where was reformed, back bled and flushed. The Mickelson catheter was then utilized to select the superior mesenteric artery and a selective superior mesenteric arteriogram was performed. Mickelson catheter was then utilized to select the IMA and a selective inferior mesenteric arteriogram was performed Next, with the use of a fathom 14 microwire, a regular Renegade microcatheter was advanced into the left colic artery and a selective left colic arteriogram was performed. The microcatheter was then advanced to select the marginal artery and a selective marginal artery arteriogram was performed. The segment of the marginal artery distal to the origin of the arcade supplying the ill-defined area of extravasation within the descending colon was then percutaneously coil embolized with several overlapping 2 mm diameter interlock coils. The tiny arcade supplying the ill-defined area of extravasation within the descending colon was able to be cannulated with the microwire, however despite prolonged efforts, the microcatheter could NOT  be advanced into the vessel given the small size of the vessel as well as associated spasm. Regardless, the origin of the vessel was successfully percutaneously coil embolized with a 2 mm diameter interlock coil. The supplying/upstream segment of the marginal artery was then percutaneously coil embolized with a 3 mm diameter interlock coils. The microcatheter was retracted centrally and completion arteriograms were performed. Images were reviewed and the procedure was terminated. All wires, catheters and sheaths were removed from the patient. Hemostasis was achieved at the right groin access site with manual compression. The patient tolerated the procedure well without immediate post procedural complication. 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 Otherwise, conventional branching pattern. There are no discrete areas of vessel irregularity or intraluminal extravasation. Selective inferior mesenteric arteriogram demonstrates a conventional branching pattern with ill-defined area of extravasation involving the mid/distal aspect of the descending colon (only seen in hind site). While selective left colic arteriograms were negative for discrete areas intraluminal contrast extravasation, sub selective injection of the marginal artery supplying the caudal division of the IMA demonstrates an ill-defined area of intraluminal contrast extravasation within the distal descending colon via a tiny distal arcade vessel compatible with the findings on preceding CTA. While the vessel was able to be successfully catheterized, the microcatheter was could not be advanced into the vessel given the small size of the vessel and associated vasospasm in the setting of active extravasation. As such, the origin of the arcade as well as the segment of the marginal artery both proximal and distal to the origin  of the  arcade was successfully percutaneously coil embolized with overlapping interlock coils. Completion arteriogram demonstrate a technically excellent result with no definitive residual collateral arterial supply to the ill-defined area of extravasation involving the distal descending colon. IMPRESSION: Technically successful percutaneous coil embolization the origin of a tiny distal arcade and the adjacent short segment of the marginal artery supplying an ill-defined area of intraluminal contrast extravasation within the distal descending colon correlating with the findings seen on preceding CTA. PLAN: - The patient is to remain flat for 4 hours with right leg straight. - The patient will continue to experience several additional bloody bowel movements and may continue to require additional resuscitation (as she was bleeding both before and during the procedure), however ultimately I am hopeful she will stabilize in the coming days. - While presumably secondary to diverticular disease, if not recently performed, further evaluation with colonoscopy after the resolution of acute symptoms is advised to exclude the presence of an underlying mass/lesion. Electronically Signed   By: Sandi Mariscal M.D.   On: 09/28/2020 17:45   IR Angiogram Selective Each Additional Vessel  Result Date: 09/28/2020 INDICATION: Acute lower GI bleeding with positive CTA. Patient presents for mesenteric arteriogram potential percutaneous embolization. EXAM: 1. ULTRASOUND GUIDANCE FOR ARTERIAL ACCESS 2. SELECTIVE SUPERIOR MESENTERIC ARTERIOGRAM 3. SELECTIVE INFERIOR MESENTERIC ARTERIOGRAM 4. SUB SELECTIVE ARTERIOGRAM OF THE LEFT COLIC ARTERY 5. SUB SELECTIVE ARTERIOGRAM MARGINAL ARTERY AND PERCUTANEOUS COIL EMBOLIZATION OF THE SEGMENT SUPPLYING THE ORIGIN OF THE ARCADE DEMONSTRATING ACTIVE EXTRAVASATION 6. SUB SELECTIVE ARTERIOGRAM AND PERCUTANEOUS COIL EMBOLIZATION OF DISTAL ARCADE SUPPLYING AREA OF EXTRAVASATION WITHIN THE DESCENDING COLON  COMPARISON:  CTA abdomen and pelvis-earlier same day MEDICATIONS: None ANESTHESIA/SEDATION: Moderate (conscious) sedation was employed during this procedure. A total of Versed 4 mg and Fentanyl 150 mcg was administered intravenously. Moderate Sedation Time: 61 minutes. The patient's level of consciousness and vital signs were monitored continuously by radiology nursing throughout the procedure under my direct supervision. CONTRAST:  70 cc Omnipaque 300 FLUOROSCOPY TIME:  15 minutes, 12 seconds (1,610 mGy) COMPLICATIONS: None immediate. PROCEDURE: Informed consent was obtained from the patient following explanation of the procedure, risks, benefits and alternatives. All questions were addressed. A time out was performed prior to the initiation of the procedure. Maximal barrier sterile technique utilized including caps, mask, sterile gowns, sterile gloves, large sterile drape, hand hygiene, and Betadine prep. The right femoral head was marked fluoroscopically. Under sterile conditions and local anesthesia, the right common femoral artery access was performed with a micropuncture needle. Under direct ultrasound guidance, the right common femoral was accessed with a micropuncture kit. An ultrasound image was saved for documentation purposes. This allowed for placement of a 5-French vascular sheath. A limited arteriogram was performed through the side arm of the sheath confirming appropriate access within the right common femoral artery. Over a Bentson wire, a Mickelson catheter was advanced the caudal aspect of the thoracic aorta where was reformed, back bled and flushed. The Mickelson catheter was then utilized to select the superior mesenteric artery and a selective superior mesenteric arteriogram was performed. Mickelson catheter was then utilized to select the IMA and a selective inferior mesenteric arteriogram was performed Next, with the use of a fathom 14 microwire, a regular Renegade microcatheter was advanced  into the left colic artery and a selective left colic arteriogram was performed. The microcatheter was then advanced to select the marginal artery and a selective marginal artery arteriogram was performed. The segment of the marginal artery distal to the  origin of the arcade supplying the ill-defined area of extravasation within the descending colon was then percutaneously coil embolized with several overlapping 2 mm diameter interlock coils. The tiny arcade supplying the ill-defined area of extravasation within the descending colon was able to be cannulated with the microwire, however despite prolonged efforts, the microcatheter could NOT be advanced into the vessel given the small size of the vessel as well as associated spasm. Regardless, the origin of the vessel was successfully percutaneously coil embolized with a 2 mm diameter interlock coil. The supplying/upstream segment of the marginal artery was then percutaneously coil embolized with a 3 mm diameter interlock coils. The microcatheter was retracted centrally and completion arteriograms were performed. Images were reviewed and the procedure was terminated. All wires, catheters and sheaths were removed from the patient. Hemostasis was achieved at the right groin access site with manual compression. The patient tolerated the procedure well without immediate post procedural complication. 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 Otherwise, conventional branching pattern. There are no discrete areas of vessel irregularity or intraluminal extravasation. Selective inferior mesenteric arteriogram demonstrates a conventional branching pattern with ill-defined area of extravasation involving the mid/distal aspect of the descending colon (only seen in hind site). While selective left colic arteriograms were negative for discrete areas intraluminal  contrast extravasation, sub selective injection of the marginal artery supplying the caudal division of the IMA demonstrates an ill-defined area of intraluminal contrast extravasation within the distal descending colon via a tiny distal arcade vessel compatible with the findings on preceding CTA. While the vessel was able to be successfully catheterized, the microcatheter was could not be advanced into the vessel given the small size of the vessel and associated vasospasm in the setting of active extravasation. As such, the origin of the arcade as well as the segment of the marginal artery both proximal and distal to the origin of the arcade was successfully percutaneously coil embolized with overlapping interlock coils. Completion arteriogram demonstrate a technically excellent result with no definitive residual collateral arterial supply to the ill-defined area of extravasation involving the distal descending colon. IMPRESSION: Technically successful percutaneous coil embolization the origin of a tiny distal arcade and the adjacent short segment of the marginal artery supplying an ill-defined area of intraluminal contrast extravasation within the distal descending colon correlating with the findings seen on preceding CTA. PLAN: - The patient is to remain flat for 4 hours with right leg straight. - The patient will continue to experience several additional bloody bowel movements and may continue to require additional resuscitation (as she was bleeding both before and during the procedure), however ultimately I am hopeful she will stabilize in the coming days. - While presumably secondary to diverticular disease, if not recently performed, further evaluation with colonoscopy after the resolution of acute symptoms is advised to exclude the presence of an underlying mass/lesion. Electronically Signed   By: Sandi Mariscal M.D.   On: 09/28/2020 17:45   IR Angiogram Selective Each Additional Vessel  Result Date:  09/28/2020 INDICATION: Acute lower GI bleeding with positive CTA. Patient presents for mesenteric arteriogram potential percutaneous embolization. EXAM: 1. ULTRASOUND GUIDANCE FOR ARTERIAL ACCESS 2. SELECTIVE SUPERIOR MESENTERIC ARTERIOGRAM 3. SELECTIVE INFERIOR MESENTERIC ARTERIOGRAM 4. SUB SELECTIVE ARTERIOGRAM OF THE LEFT COLIC ARTERY 5. SUB SELECTIVE ARTERIOGRAM MARGINAL ARTERY AND PERCUTANEOUS COIL EMBOLIZATION OF THE SEGMENT SUPPLYING THE ORIGIN OF THE ARCADE DEMONSTRATING ACTIVE EXTRAVASATION 6. SUB SELECTIVE ARTERIOGRAM AND PERCUTANEOUS  COIL EMBOLIZATION OF DISTAL ARCADE SUPPLYING AREA OF EXTRAVASATION WITHIN THE DESCENDING COLON COMPARISON:  CTA abdomen and pelvis-earlier same day MEDICATIONS: None ANESTHESIA/SEDATION: Moderate (conscious) sedation was employed during this procedure. A total of Versed 4 mg and Fentanyl 150 mcg was administered intravenously. Moderate Sedation Time: 61 minutes. The patient's level of consciousness and vital signs were monitored continuously by radiology nursing throughout the procedure under my direct supervision. CONTRAST:  70 cc Omnipaque 300 FLUOROSCOPY TIME:  15 minutes, 12 seconds (7,517 mGy) COMPLICATIONS: None immediate. PROCEDURE: Informed consent was obtained from the patient following explanation of the procedure, risks, benefits and alternatives. All questions were addressed. A time out was performed prior to the initiation of the procedure. Maximal barrier sterile technique utilized including caps, mask, sterile gowns, sterile gloves, large sterile drape, hand hygiene, and Betadine prep. The right femoral head was marked fluoroscopically. Under sterile conditions and local anesthesia, the right common femoral artery access was performed with a micropuncture needle. Under direct ultrasound guidance, the right common femoral was accessed with a micropuncture kit. An ultrasound image was saved for documentation purposes. This allowed for placement of a 5-French  vascular sheath. A limited arteriogram was performed through the side arm of the sheath confirming appropriate access within the right common femoral artery. Over a Bentson wire, a Mickelson catheter was advanced the caudal aspect of the thoracic aorta where was reformed, back bled and flushed. The Mickelson catheter was then utilized to select the superior mesenteric artery and a selective superior mesenteric arteriogram was performed. Mickelson catheter was then utilized to select the IMA and a selective inferior mesenteric arteriogram was performed Next, with the use of a fathom 14 microwire, a regular Renegade microcatheter was advanced into the left colic artery and a selective left colic arteriogram was performed. The microcatheter was then advanced to select the marginal artery and a selective marginal artery arteriogram was performed. The segment of the marginal artery distal to the origin of the arcade supplying the ill-defined area of extravasation within the descending colon was then percutaneously coil embolized with several overlapping 2 mm diameter interlock coils. The tiny arcade supplying the ill-defined area of extravasation within the descending colon was able to be cannulated with the microwire, however despite prolonged efforts, the microcatheter could NOT be advanced into the vessel given the small size of the vessel as well as associated spasm. Regardless, the origin of the vessel was successfully percutaneously coil embolized with a 2 mm diameter interlock coil. The supplying/upstream segment of the marginal artery was then percutaneously coil embolized with a 3 mm diameter interlock coils. The microcatheter was retracted centrally and completion arteriograms were performed. Images were reviewed and the procedure was terminated. All wires, catheters and sheaths were removed from the patient. Hemostasis was achieved at the right groin access site with manual compression. The patient tolerated  the procedure well without immediate post procedural complication. 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 Otherwise, conventional branching pattern. There are no discrete areas of vessel irregularity or intraluminal extravasation. Selective inferior mesenteric arteriogram demonstrates a conventional branching pattern with ill-defined area of extravasation involving the mid/distal aspect of the descending colon (only seen in hind site). While selective left colic arteriograms were negative for discrete areas intraluminal contrast extravasation, sub selective injection of the marginal artery supplying the caudal division of the IMA demonstrates an ill-defined area of intraluminal contrast extravasation within the distal descending colon  via a tiny distal arcade vessel compatible with the findings on preceding CTA. While the vessel was able to be successfully catheterized, the microcatheter was could not be advanced into the vessel given the small size of the vessel and associated vasospasm in the setting of active extravasation. As such, the origin of the arcade as well as the segment of the marginal artery both proximal and distal to the origin of the arcade was successfully percutaneously coil embolized with overlapping interlock coils. Completion arteriogram demonstrate a technically excellent result with no definitive residual collateral arterial supply to the ill-defined area of extravasation involving the distal descending colon. IMPRESSION: Technically successful percutaneous coil embolization the origin of a tiny distal arcade and the adjacent short segment of the marginal artery supplying an ill-defined area of intraluminal contrast extravasation within the distal descending colon correlating with the findings seen on preceding CTA. PLAN: - The patient is to remain flat for 4 hours with  right leg straight. - The patient will continue to experience several additional bloody bowel movements and may continue to require additional resuscitation (as she was bleeding both before and during the procedure), however ultimately I am hopeful she will stabilize in the coming days. - While presumably secondary to diverticular disease, if not recently performed, further evaluation with colonoscopy after the resolution of acute symptoms is advised to exclude the presence of an underlying mass/lesion. Electronically Signed   By: Sandi Mariscal M.D.   On: 09/28/2020 17:45   IR Angiogram Selective Each Additional Vessel  Result Date: 09/28/2020 INDICATION: Acute lower GI bleeding with positive CTA. Patient presents for mesenteric arteriogram potential percutaneous embolization. EXAM: 1. ULTRASOUND GUIDANCE FOR ARTERIAL ACCESS 2. SELECTIVE SUPERIOR MESENTERIC ARTERIOGRAM 3. SELECTIVE INFERIOR MESENTERIC ARTERIOGRAM 4. SUB SELECTIVE ARTERIOGRAM OF THE LEFT COLIC ARTERY 5. SUB SELECTIVE ARTERIOGRAM MARGINAL ARTERY AND PERCUTANEOUS COIL EMBOLIZATION OF THE SEGMENT SUPPLYING THE ORIGIN OF THE ARCADE DEMONSTRATING ACTIVE EXTRAVASATION 6. SUB SELECTIVE ARTERIOGRAM AND PERCUTANEOUS COIL EMBOLIZATION OF DISTAL ARCADE SUPPLYING AREA OF EXTRAVASATION WITHIN THE DESCENDING COLON COMPARISON:  CTA abdomen and pelvis-earlier same day MEDICATIONS: None ANESTHESIA/SEDATION: Moderate (conscious) sedation was employed during this procedure. A total of Versed 4 mg and Fentanyl 150 mcg was administered intravenously. Moderate Sedation Time: 61 minutes. The patient's level of consciousness and vital signs were monitored continuously by radiology nursing throughout the procedure under my direct supervision. CONTRAST:  70 cc Omnipaque 300 FLUOROSCOPY TIME:  15 minutes, 12 seconds (8,177 mGy) COMPLICATIONS: None immediate. PROCEDURE: Informed consent was obtained from the patient following explanation of the procedure, risks, benefits and  alternatives. All questions were addressed. A time out was performed prior to the initiation of the procedure. Maximal barrier sterile technique utilized including caps, mask, sterile gowns, sterile gloves, large sterile drape, hand hygiene, and Betadine prep. The right femoral head was marked fluoroscopically. Under sterile conditions and local anesthesia, the right common femoral artery access was performed with a micropuncture needle. Under direct ultrasound guidance, the right common femoral was accessed with a micropuncture kit. An ultrasound image was saved for documentation purposes. This allowed for placement of a 5-French vascular sheath. A limited arteriogram was performed through the side arm of the sheath confirming appropriate access within the right common femoral artery. Over a Bentson wire, a Mickelson catheter was advanced the caudal aspect of the thoracic aorta where was reformed, back bled and flushed. The Mickelson catheter was then utilized to select the superior mesenteric artery and a selective superior mesenteric arteriogram was performed. Mickelson catheter  was then utilized to select the IMA and a selective inferior mesenteric arteriogram was performed Next, with the use of a fathom 14 microwire, a regular Renegade microcatheter was advanced into the left colic artery and a selective left colic arteriogram was performed. The microcatheter was then advanced to select the marginal artery and a selective marginal artery arteriogram was performed. The segment of the marginal artery distal to the origin of the arcade supplying the ill-defined area of extravasation within the descending colon was then percutaneously coil embolized with several overlapping 2 mm diameter interlock coils. The tiny arcade supplying the ill-defined area of extravasation within the descending colon was able to be cannulated with the microwire, however despite prolonged efforts, the microcatheter could NOT be advanced  into the vessel given the small size of the vessel as well as associated spasm. Regardless, the origin of the vessel was successfully percutaneously coil embolized with a 2 mm diameter interlock coil. The supplying/upstream segment of the marginal artery was then percutaneously coil embolized with a 3 mm diameter interlock coils. The microcatheter was retracted centrally and completion arteriograms were performed. Images were reviewed and the procedure was terminated. All wires, catheters and sheaths were removed from the patient. Hemostasis was achieved at the right groin access site with manual compression. The patient tolerated the procedure well without immediate post procedural complication. 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 Otherwise, conventional branching pattern. There are no discrete areas of vessel irregularity or intraluminal extravasation. Selective inferior mesenteric arteriogram demonstrates a conventional branching pattern with ill-defined area of extravasation involving the mid/distal aspect of the descending colon (only seen in hind site). While selective left colic arteriograms were negative for discrete areas intraluminal contrast extravasation, sub selective injection of the marginal artery supplying the caudal division of the IMA demonstrates an ill-defined area of intraluminal contrast extravasation within the distal descending colon via a tiny distal arcade vessel compatible with the findings on preceding CTA. While the vessel was able to be successfully catheterized, the microcatheter was could not be advanced into the vessel given the small size of the vessel and associated vasospasm in the setting of active extravasation. As such, the origin of the arcade as well as the segment of the marginal artery both proximal and distal to the origin of the arcade was  successfully percutaneously coil embolized with overlapping interlock coils. Completion arteriogram demonstrate a technically excellent result with no definitive residual collateral arterial supply to the ill-defined area of extravasation involving the distal descending colon. IMPRESSION: Technically successful percutaneous coil embolization the origin of a tiny distal arcade and the adjacent short segment of the marginal artery supplying an ill-defined area of intraluminal contrast extravasation within the distal descending colon correlating with the findings seen on preceding CTA. PLAN: - The patient is to remain flat for 4 hours with right leg straight. - The patient will continue to experience several additional bloody bowel movements and may continue to require additional resuscitation (as she was bleeding both before and during the procedure), however ultimately I am hopeful she will stabilize in the coming days. - While presumably secondary to diverticular disease, if not recently performed, further evaluation with colonoscopy after the resolution of acute symptoms is advised to exclude the presence of an underlying mass/lesion. Electronically Signed   By: Sandi Mariscal M.D.   On: 09/28/2020 17:45   IR Angiogram Selective Each Additional Vessel  Result Date: 09/28/2020  INDICATION: Acute lower GI bleeding with positive CTA. Patient presents for mesenteric arteriogram potential percutaneous embolization. EXAM: 1. ULTRASOUND GUIDANCE FOR ARTERIAL ACCESS 2. SELECTIVE SUPERIOR MESENTERIC ARTERIOGRAM 3. SELECTIVE INFERIOR MESENTERIC ARTERIOGRAM 4. SUB SELECTIVE ARTERIOGRAM OF THE LEFT COLIC ARTERY 5. SUB SELECTIVE ARTERIOGRAM MARGINAL ARTERY AND PERCUTANEOUS COIL EMBOLIZATION OF THE SEGMENT SUPPLYING THE ORIGIN OF THE ARCADE DEMONSTRATING ACTIVE EXTRAVASATION 6. SUB SELECTIVE ARTERIOGRAM AND PERCUTANEOUS COIL EMBOLIZATION OF DISTAL ARCADE SUPPLYING AREA OF EXTRAVASATION WITHIN THE DESCENDING COLON COMPARISON:   CTA abdomen and pelvis-earlier same day MEDICATIONS: None ANESTHESIA/SEDATION: Moderate (conscious) sedation was employed during this procedure. A total of Versed 4 mg and Fentanyl 150 mcg was administered intravenously. Moderate Sedation Time: 61 minutes. The patient's level of consciousness and vital signs were monitored continuously by radiology nursing throughout the procedure under my direct supervision. CONTRAST:  70 cc Omnipaque 300 FLUOROSCOPY TIME:  15 minutes, 12 seconds (6,546 mGy) COMPLICATIONS: None immediate. PROCEDURE: Informed consent was obtained from the patient following explanation of the procedure, risks, benefits and alternatives. All questions were addressed. A time out was performed prior to the initiation of the procedure. Maximal barrier sterile technique utilized including caps, mask, sterile gowns, sterile gloves, large sterile drape, hand hygiene, and Betadine prep. The right femoral head was marked fluoroscopically. Under sterile conditions and local anesthesia, the right common femoral artery access was performed with a micropuncture needle. Under direct ultrasound guidance, the right common femoral was accessed with a micropuncture kit. An ultrasound image was saved for documentation purposes. This allowed for placement of a 5-French vascular sheath. A limited arteriogram was performed through the side arm of the sheath confirming appropriate access within the right common femoral artery. Over a Bentson wire, a Mickelson catheter was advanced the caudal aspect of the thoracic aorta where was reformed, back bled and flushed. The Mickelson catheter was then utilized to select the superior mesenteric artery and a selective superior mesenteric arteriogram was performed. Mickelson catheter was then utilized to select the IMA and a selective inferior mesenteric arteriogram was performed Next, with the use of a fathom 14 microwire, a regular Renegade microcatheter was advanced into the left  colic artery and a selective left colic arteriogram was performed. The microcatheter was then advanced to select the marginal artery and a selective marginal artery arteriogram was performed. The segment of the marginal artery distal to the origin of the arcade supplying the ill-defined area of extravasation within the descending colon was then percutaneously coil embolized with several overlapping 2 mm diameter interlock coils. The tiny arcade supplying the ill-defined area of extravasation within the descending colon was able to be cannulated with the microwire, however despite prolonged efforts, the microcatheter could NOT be advanced into the vessel given the small size of the vessel as well as associated spasm. Regardless, the origin of the vessel was successfully percutaneously coil embolized with a 2 mm diameter interlock coil. The supplying/upstream segment of the marginal artery was then percutaneously coil embolized with a 3 mm diameter interlock coils. The microcatheter was retracted centrally and completion arteriograms were performed. Images were reviewed and the procedure was terminated. All wires, catheters and sheaths were removed from the patient. Hemostasis was achieved at the right groin access site with manual compression. The patient tolerated the procedure well without immediate post procedural complication. 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 Otherwise, conventional branching pattern. There are no discrete areas of vessel irregularity  or intraluminal extravasation. Selective inferior mesenteric arteriogram demonstrates a conventional branching pattern with ill-defined area of extravasation involving the mid/distal aspect of the descending colon (only seen in hind site). While selective left colic arteriograms were negative for discrete areas intraluminal contrast  extravasation, sub selective injection of the marginal artery supplying the caudal division of the IMA demonstrates an ill-defined area of intraluminal contrast extravasation within the distal descending colon via a tiny distal arcade vessel compatible with the findings on preceding CTA. While the vessel was able to be successfully catheterized, the microcatheter was could not be advanced into the vessel given the small size of the vessel and associated vasospasm in the setting of active extravasation. As such, the origin of the arcade as well as the segment of the marginal artery both proximal and distal to the origin of the arcade was successfully percutaneously coil embolized with overlapping interlock coils. Completion arteriogram demonstrate a technically excellent result with no definitive residual collateral arterial supply to the ill-defined area of extravasation involving the distal descending colon. IMPRESSION: Technically successful percutaneous coil embolization the origin of a tiny distal arcade and the adjacent short segment of the marginal artery supplying an ill-defined area of intraluminal contrast extravasation within the distal descending colon correlating with the findings seen on preceding CTA. PLAN: - The patient is to remain flat for 4 hours with right leg straight. - The patient will continue to experience several additional bloody bowel movements and may continue to require additional resuscitation (as she was bleeding both before and during the procedure), however ultimately I am hopeful she will stabilize in the coming days. - While presumably secondary to diverticular disease, if not recently performed, further evaluation with colonoscopy after the resolution of acute symptoms is advised to exclude the presence of an underlying mass/lesion. Electronically Signed   By: Sandi Mariscal M.D.   On: 09/28/2020 17:45   IR US Guide Vasc Access Right  Result Date: 09/28/2020 INDICATION: Acute  lower GI bleeding with positive CTA. Patient presents for mesenteric arteriogram potential percutaneous embolization. EXAM: 1. ULTRASOUND GUIDANCE FOR ARTERIAL ACCESS 2. SELECTIVE SUPERIOR MESENTERIC ARTERIOGRAM 3. SELECTIVE INFERIOR MESENTERIC ARTERIOGRAM 4. SUB SELECTIVE ARTERIOGRAM OF THE LEFT COLIC ARTERY 5. SUB SELECTIVE ARTERIOGRAM MARGINAL ARTERY AND PERCUTANEOUS COIL EMBOLIZATION OF THE SEGMENT SUPPLYING THE ORIGIN OF THE ARCADE DEMONSTRATING ACTIVE EXTRAVASATION 6. SUB SELECTIVE ARTERIOGRAM AND PERCUTANEOUS COIL EMBOLIZATION OF DISTAL ARCADE SUPPLYING AREA OF EXTRAVASATION WITHIN THE DESCENDING COLON COMPARISON:  CTA abdomen and pelvis-earlier same day MEDICATIONS: None ANESTHESIA/SEDATION: Moderate (conscious) sedation was employed during this procedure. A total of Versed 4 mg and Fentanyl 150 mcg was administered intravenously. Moderate Sedation Time: 61 minutes. The patient's level of consciousness and vital signs were monitored continuously by radiology nursing throughout the procedure under my direct supervision. CONTRAST:  70 cc Omnipaque 300 FLUOROSCOPY TIME:  15 minutes, 12 seconds (9,244 mGy) COMPLICATIONS: None immediate. PROCEDURE: Informed consent was obtained from the patient following explanation of the procedure, risks, benefits and alternatives. All questions were addressed. A time out was performed prior to the initiation of the procedure. Maximal barrier sterile technique utilized including caps, mask, sterile gowns, sterile gloves, large sterile drape, hand hygiene, and Betadine prep. The right femoral head was marked fluoroscopically. Under sterile conditions and local anesthesia, the right common femoral artery access was performed with a micropuncture needle. Under direct ultrasound guidance, the right common femoral was accessed with a micropuncture kit. An ultrasound image was saved for documentation purposes. This allowed for placement of  a 5-French vascular sheath. A limited  arteriogram was performed through the side arm of the sheath confirming appropriate access within the right common femoral artery. Over a Bentson wire, a Mickelson catheter was advanced the caudal aspect of the thoracic aorta where was reformed, back bled and flushed. The Mickelson catheter was then utilized to select the superior mesenteric artery and a selective superior mesenteric arteriogram was performed. Mickelson catheter was then utilized to select the IMA and a selective inferior mesenteric arteriogram was performed Next, with the use of a fathom 14 microwire, a regular Renegade microcatheter was advanced into the left colic artery and a selective left colic arteriogram was performed. The microcatheter was then advanced to select the marginal artery and a selective marginal artery arteriogram was performed. The segment of the marginal artery distal to the origin of the arcade supplying the ill-defined area of extravasation within the descending colon was then percutaneously coil embolized with several overlapping 2 mm diameter interlock coils. The tiny arcade supplying the ill-defined area of extravasation within the descending colon was able to be cannulated with the microwire, however despite prolonged efforts, the microcatheter could NOT be advanced into the vessel given the small size of the vessel as well as associated spasm. Regardless, the origin of the vessel was successfully percutaneously coil embolized with a 2 mm diameter interlock coil. The supplying/upstream segment of the marginal artery was then percutaneously coil embolized with a 3 mm diameter interlock coils. The microcatheter was retracted centrally and completion arteriograms were performed. Images were reviewed and the procedure was terminated. All wires, catheters and sheaths were removed from the patient. Hemostasis was achieved at the right groin access site with manual compression. The patient tolerated the procedure well without  immediate post procedural complication. 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 Otherwise, conventional branching pattern. There are no discrete areas of vessel irregularity or intraluminal extravasation. Selective inferior mesenteric arteriogram demonstrates a conventional branching pattern with ill-defined area of extravasation involving the mid/distal aspect of the descending colon (only seen in hind site). While selective left colic arteriograms were negative for discrete areas intraluminal contrast extravasation, sub selective injection of the marginal artery supplying the caudal division of the IMA demonstrates an ill-defined area of intraluminal contrast extravasation within the distal descending colon via a tiny distal arcade vessel compatible with the findings on preceding CTA. While the vessel was able to be successfully catheterized, the microcatheter was could not be advanced into the vessel given the small size of the vessel and associated vasospasm in the setting of active extravasation. As such, the origin of the arcade as well as the segment of the marginal artery both proximal and distal to the origin of the arcade was successfully percutaneously coil embolized with overlapping interlock coils. Completion arteriogram demonstrate a technically excellent result with no definitive residual collateral arterial supply to the ill-defined area of extravasation involving the distal descending colon. IMPRESSION: Technically successful percutaneous coil embolization the origin of a tiny distal arcade and the adjacent short segment of the marginal artery supplying an ill-defined area of intraluminal contrast extravasation within the distal descending colon correlating with the findings seen on preceding CTA. PLAN: - The patient is to remain flat for 4 hours with right leg straight. - The  patient will continue to experience several additional bloody bowel movements and may continue to require additional resuscitation (as she was bleeding both before and during  the procedure), however ultimately I am hopeful she will stabilize in the coming days. - While presumably secondary to diverticular disease, if not recently performed, further evaluation with colonoscopy after the resolution of acute symptoms is advised to exclude the presence of an underlying mass/lesion. Electronically Signed   By: Sandi Mariscal M.D.   On: 09/28/2020 17:45   MM Digital Diagnostic Unilat R  Result Date: 09/12/2020 CLINICAL DATA:  70 year old female for further evaluation of RIGHT breast calcifications identified on screening mammogram. History of benign RIGHT breast biopsies in 2019 with development of a large hematoma. EXAM: DIGITAL DIAGNOSTIC UNILATERAL RIGHT MAMMOGRAM TECHNIQUE: Right digital diagnostic mammography was performed. Mammographic images were processed with CAD. COMPARISON:  Previous exam(s). ACR Breast Density Category b: There are scattered areas of fibroglandular density. FINDINGS: Full field and magnification views of the RIGHT breast demonstrate benign dystrophic calcifications within the UPPER-OUTER RIGHT breast, corresponding to the screening study findings. Biopsy clips and evolutionary fat necrosis/hematoma changes within the UPPER-OUTER RIGHT breast are noted. IMPRESSION: Benign dystrophic calcifications within the UPPER-OUTER RIGHT breast accounting for the screening study finding. RECOMMENDATION: Bilateral screening mammogram in 1 year. I have discussed the findings and recommendations with the patient. If applicable, a reminder letter will be sent to the patient regarding the next appointment. BI-RADS CATEGORY  2: Benign. Electronically Signed   By: Margarette Canada M.D.   On: 09/12/2020 13:32   IR EMBO ART  VEN HEMORR LYMPH EXTRAV  INC GUIDE ROADMAPPING  Result Date: 09/28/2020 INDICATION: Acute  lower GI bleeding with positive CTA. Patient presents for mesenteric arteriogram potential percutaneous embolization. EXAM: 1. ULTRASOUND GUIDANCE FOR ARTERIAL ACCESS 2. SELECTIVE SUPERIOR MESENTERIC ARTERIOGRAM 3. SELECTIVE INFERIOR MESENTERIC ARTERIOGRAM 4. SUB SELECTIVE ARTERIOGRAM OF THE LEFT COLIC ARTERY 5. SUB SELECTIVE ARTERIOGRAM MARGINAL ARTERY AND PERCUTANEOUS COIL EMBOLIZATION OF THE SEGMENT SUPPLYING THE ORIGIN OF THE ARCADE DEMONSTRATING ACTIVE EXTRAVASATION 6. SUB SELECTIVE ARTERIOGRAM AND PERCUTANEOUS COIL EMBOLIZATION OF DISTAL ARCADE SUPPLYING AREA OF EXTRAVASATION WITHIN THE DESCENDING COLON COMPARISON:  CTA abdomen and pelvis-earlier same day MEDICATIONS: None ANESTHESIA/SEDATION: Moderate (conscious) sedation was employed during this procedure. A total of Versed 4 mg and Fentanyl 150 mcg was administered intravenously. Moderate Sedation Time: 61 minutes. The patient's level of consciousness and vital signs were monitored continuously by radiology nursing throughout the procedure under my direct supervision. CONTRAST:  70 cc Omnipaque 300 FLUOROSCOPY TIME:  15 minutes, 12 seconds (5,329 mGy) COMPLICATIONS: None immediate. PROCEDURE: Informed consent was obtained from the patient following explanation of the procedure, risks, benefits and alternatives. All questions were addressed. A time out was performed prior to the initiation of the procedure. Maximal barrier sterile technique utilized including caps, mask, sterile gowns, sterile gloves, large sterile drape, hand hygiene, and Betadine prep. The right femoral head was marked fluoroscopically. Under sterile conditions and local anesthesia, the right common femoral artery access was performed with a micropuncture needle. Under direct ultrasound guidance, the right common femoral was accessed with a micropuncture kit. An ultrasound image was saved for documentation purposes. This allowed for placement of a 5-French vascular sheath. A limited  arteriogram was performed through the side arm of the sheath confirming appropriate access within the right common femoral artery. Over a Bentson wire, a Mickelson catheter was advanced the caudal aspect of the thoracic aorta where was reformed, back bled and flushed. The Mickelson catheter was then utilized to select the superior mesenteric artery and a selective superior mesenteric arteriogram was performed. Mickelson catheter was then utilized to select the  IMA and a selective inferior mesenteric arteriogram was performed Next, with the use of a fathom 14 microwire, a regular Renegade microcatheter was advanced into the left colic artery and a selective left colic arteriogram was performed. The microcatheter was then advanced to select the marginal artery and a selective marginal artery arteriogram was performed. The segment of the marginal artery distal to the origin of the arcade supplying the ill-defined area of extravasation within the descending colon was then percutaneously coil embolized with several overlapping 2 mm diameter interlock coils. The tiny arcade supplying the ill-defined area of extravasation within the descending colon was able to be cannulated with the microwire, however despite prolonged efforts, the microcatheter could NOT be advanced into the vessel given the small size of the vessel as well as associated spasm. Regardless, the origin of the vessel was successfully percutaneously coil embolized with a 2 mm diameter interlock coil. The supplying/upstream segment of the marginal artery was then percutaneously coil embolized with a 3 mm diameter interlock coils. The microcatheter was retracted centrally and completion arteriograms were performed. Images were reviewed and the procedure was terminated. All wires, catheters and sheaths were removed from the patient. Hemostasis was achieved at the right groin access site with manual compression. The patient tolerated the procedure well without  immediate post procedural complication. 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 Otherwise, conventional branching pattern. There are no discrete areas of vessel irregularity or intraluminal extravasation. Selective inferior mesenteric arteriogram demonstrates a conventional branching pattern with ill-defined area of extravasation involving the mid/distal aspect of the descending colon (only seen in hind site). While selective left colic arteriograms were negative for discrete areas intraluminal contrast extravasation, sub selective injection of the marginal artery supplying the caudal division of the IMA demonstrates an ill-defined area of intraluminal contrast extravasation within the distal descending colon via a tiny distal arcade vessel compatible with the findings on preceding CTA. While the vessel was able to be successfully catheterized, the microcatheter was could not be advanced into the vessel given the small size of the vessel and associated vasospasm in the setting of active extravasation. As such, the origin of the arcade as well as the segment of the marginal artery both proximal and distal to the origin of the arcade was successfully percutaneously coil embolized with overlapping interlock coils. Completion arteriogram demonstrate a technically excellent result with no definitive residual collateral arterial supply to the ill-defined area of extravasation involving the distal descending colon. IMPRESSION: Technically successful percutaneous coil embolization the origin of a tiny distal arcade and the adjacent short segment of the marginal artery supplying an ill-defined area of intraluminal contrast extravasation within the distal descending colon correlating with the findings seen on preceding CTA. PLAN: - The patient is to remain flat for 4 hours with right leg straight. - The  patient will continue to experience several additional bloody bowel movements and may continue to require additional resuscitation (as she was bleeding both before and during the procedure), however ultimately I am hopeful she will stabilize in the coming days. - While presumably secondary to diverticular disease, if not recently performed, further evaluation with colonoscopy after the resolution of acute symptoms is advised to exclude the presence of an underlying mass/lesion. Electronically Signed   By: Sandi Mariscal M.D.   On: 09/28/2020 17:45   CT CARDIAC SCORING ([facility] LOCATIONS ONLY)  Result Date: 09/22/2020 CLINICAL DATA:  High cholesterol, hypertension  EXAM: CT CARDIAC CORONARY ARTERY CALCIUM SCORE TECHNIQUE: Non-contrast imaging through the heart was performed using prospective ECG gating. Image post processing was performed on an independent workstation, allowing for quantitative analysis of the heart and coronary arteries. Note that this exam targets the heart and the chest was not imaged in its entirety. COMPARISON:  None. FINDINGS: CORONARY CALCIUM SCORES: Left Main: 0 LAD: 150 LCx: 11 RCA: 0 Total Agatston Score: 161 MESA database percentile: 77 AORTA MEASUREMENTS: Ascending Aorta: 33 mm Descending Aorta: 22 mm OTHER FINDINGS: Heart is normal size. Dense mitral valve annular calcifications. Calcifications in the aortic root. No adenopathy. Linear scarring in the lung bases. No effusions. Imaging into the upper abdomen demonstrates no acute findings. Chest wall soft tissues are unremarkable. No acute bony abnormality. IMPRESSION: The observed calcium score of 161 is at the 77th percentile for subjects of the same age, gender and race/ethnicity who are free of clinical cardiovascular disease and treated diabetes. Aortic root atherosclerosis. No acute extra cardiac abnormality. Electronically Signed   By: Rolm Baptise M.D.   On: 09/22/2020 11:53   CT Angio Abd/Pel W and/or Wo Contrast  Result Date:  09/28/2020 CLINICAL DATA:  Acute bright red blood per rectum, lower GI bleed, concern for diverticular source EXAM: CT ANGIOGRAPHY ABDOMEN AND PELVIS WITH CONTRAST AND WITHOUT CONTRAST TECHNIQUE: Multidetector CT imaging of the abdomen and pelvis was performed using the standard protocol during bolus administration of intravenous contrast. Multiplanar reconstructed images and MIPs were obtained and reviewed to evaluate the vascular anatomy. CONTRAST:  146m OMNIPAQUE IOHEXOL 350 MG/ML SOLN COMPARISON:  None. FINDINGS: VASCULAR Aorta: Aorta atherosclerotic. Negative for aneurysm or dissection. No retroperitoneal hemorrhage or hematoma. No occlusive disease. Celiac: Widely patent including its branches SMA: Widely patent including its branches. Replaced right hepatic artery off the SMA. Renals: Widely patent main renal arteries and accessory left renal artery. IMA: IMA is patent off the distal aorta. Within the left descending colon, there is active arterial contrast extravasation on the arterial phase as well as the delayed imaging compatible with an acute diverticular bleed. Inflow: Minor iliac atherosclerosis without inflow disease or occlusion. Common, internal and external iliac arteries all remain patent. Proximal Outflow: Bilateral common femoral and visualized portions of the superficial and profunda femoral arteries are patent without evidence of aneurysm, dissection, vasculitis or significant stenosis. Veins: No veno-occlusive process. Review of the MIP images confirms the above findings. NON-VASCULAR Lower chest: No acute lower chest finding. Hepatobiliary: No focal hepatic abnormality. No intrahepatic biliary dilatation. Large calcified gallstones noted. Pancreas: Unremarkable. No pancreatic ductal dilatation or surrounding inflammatory changes. Spleen: Normal in size without focal abnormality. Adrenals/Urinary Tract: Normal adrenal glands. Subcentimeter tiny bilateral renal cortical cysts. No other focal  renal abnormality. No hydronephrosis or obstruction. Ureters are symmetric and decompressed. No obstructing ureteral calculus. Bladder collapsed. Stomach/Bowel: Negative for bowel obstruction, significant dilatation, ileus, or free air. Retrocecal appendix noted containing air without acute inflammation. No free fluid, fluid collection, abscess, or ascites. Lymphatic: No bulky adenopathy. Reproductive: Uterus and bilateral adnexa are unremarkable. Other: No abdominal wall hernia or abnormality. No abdominopelvic ascites. Musculoskeletal: Degenerative changes of the spine. Lower lumbar facet arthropathy. No acute osseous finding. IMPRESSION: VASCULAR Positive CTA for active diverticular bleed in the distal left descending colon in the left lower quadrant as detailed above. NON-VASCULAR Large calcified gallstones These results were called by telephone at the time of interpretation on 09/28/2020 at 1 p.m. to provider SSherwood Gambler, who verbally acknowledged these results. Electronically Signed  By: Eugenie Filler M.D.   On: 09/28/2020 13:11       Discharge Exam: Vitals:   09/29/20 0532 09/29/20 1339  BP: (!) 95/56 101/70  Pulse: 92 93  Resp: 18 16  Temp: 97.6 F (36.4 C)   SpO2: 97% 97%    General: Pt is alert, awake, not in acute distress Cardiovascular: RRR, S1/S2 +, no edema Respiratory: CTA bilaterally, no wheezing, no rhonchi, no respiratory distress, no conversational dyspnea  Abdominal: Soft, NT, ND, bowel sounds + Extremities: no edema, no cyanosis Psych: Normal mood and affect, stable judgement and insight     The results of significant diagnostics from this hospitalization (including imaging, microbiology, ancillary and laboratory) are listed below for reference.     Microbiology: Recent Results (from the past 240 hour(s))  Resp Panel by RT-PCR (Flu A&B, Covid) Nasopharyngeal Swab     Status: None   Collection Time: 09/28/20  2:17 PM   Specimen: Nasopharyngeal Swab;  Nasopharyngeal(NP) swabs in vial transport medium  Result Value Ref Range Status   SARS Coronavirus 2 by RT PCR NEGATIVE NEGATIVE Final    Comment: (NOTE) SARS-CoV-2 target nucleic acids are NOT DETECTED.  The SARS-CoV-2 RNA is generally detectable in upper respiratory specimens during the acute phase of infection. The lowest concentration of SARS-CoV-2 viral copies this assay can detect is 138 copies/mL. A negative result does not preclude SARS-Cov-2 infection and should not be used as the sole basis for treatment or other patient management decisions. A negative result may occur with  improper specimen collection/handling, submission of specimen other than nasopharyngeal swab, presence of viral mutation(s) within the areas targeted by this assay, and inadequate number of viral copies(<138 copies/mL). A negative result must be combined with clinical observations, patient history, and epidemiological information. The expected result is Negative.  Fact Sheet for Patients:  EntrepreneurPulse.com.au  Fact Sheet for Healthcare Providers:  IncredibleEmployment.be  This test is no t yet approved or cleared by the [place] FDA and  has been authorized for detection and/or diagnosis of SARS-CoV-2 by FDA under an Emergency Use Authorization (EUA). This EUA will remain  in effect (meaning this test can be used) for the duration of the COVID-19 declaration under Section 564(b)(1) of the Act, 21 U.S.C.section 360bbb-3(b)(1), unless the authorization is terminated  or revoked sooner.       Influenza A by PCR NEGATIVE NEGATIVE Final   Influenza B by PCR NEGATIVE NEGATIVE Final    Comment: (NOTE) The Xpert Xpress SARS-CoV-2/FLU/RSV plus assay is intended as an aid in the diagnosis of influenza from Nasopharyngeal swab specimens and should not be used as a sole basis for treatment. Nasal washings and aspirates are unacceptable for Xpert Xpress  SARS-CoV-2/FLU/RSV testing.  Fact Sheet for Patients: EntrepreneurPulse.com.au  Fact Sheet for Healthcare Providers: IncredibleEmployment.be  This test is not yet approved or cleared by the [place] FDA and has been authorized for detection and/or diagnosis of SARS-CoV-2 by FDA under an Emergency Use Authorization (EUA). This EUA will remain in effect (meaning this test can be used) for the duration of the COVID-19 declaration under Section 564(b)(1) of the Act, 21 U.S.C. section 360bbb-3(b)(1), unless the authorization is terminated or revoked.  Performed at [facility], [place] [address] [place], [place] [address]      Labs: BNP (last 3 results) No results for input(s): BNP in the last 8760 hours. Basic Metabolic Panel: Recent Labs  Lab 09/28/20 0905 09/29/20 0030  NA 134* 137  K  3.9 4.5  CL 102 106  CO2 23 24  GLUCOSE 160* 130*  BUN 16 13  CREATININE 0.74 0.63  CALCIUM 8.8* 8.4*   Liver Function Tests: Recent Labs  Lab 09/28/20 0905  AST 35  ALT 43  ALKPHOS 44  BILITOT 0.5  PROT 6.6  ALBUMIN 3.7   No results for input(s): LIPASE, AMYLASE in the last 168 hours. No results for input(s): AMMONIA in the last 168 hours. CBC: Recent Labs  Lab 09/28/20 0905 09/28/20 1417 09/28/20 1806 09/29/20 0030  WBC 11.8*  --   --  13.3*  HGB 10.7* 10.0* 8.8* 8.1*   8.2*  HCT 33.5* 30.9* 27.9* 25.2*   25.3*  MCV 100.0  --   --  99.2  PLT 392  --   --  350   Cardiac Enzymes: No results for input(s): CKTOTAL, CKMB, CKMBINDEX, TROPONINI in the last 168 hours. BNP: Invalid input(s): POCBNP CBG: No results for input(s): GLUCAP in the last 168 hours. D-Dimer No results for input(s): DDIMER in the last 72 hours. Hgb A1c No results for input(s): HGBA1C in the last 72 hours. Lipid Profile No results for input(s): CHOL, HDL, LDLCALC, TRIG, CHOLHDL, LDLDIRECT in the last 72 hours. Thyroid function studies No  results for input(s): TSH, T4TOTAL, T3FREE, THYROIDAB in the last 72 hours.  Invalid input(s): FREET3 Anemia work up No results for input(s): VITAMINB12, FOLATE, FERRITIN, TIBC, IRON, RETICCTPCT in the last 72 hours. Urinalysis No results found for: COLORURINE, APPEARANCEUR, [place], [place], GLUCOSEU, [place], [place], [place], PROTEINUR, UROBILINOGEN, NITRITE, LEUKOCYTESUR Sepsis Labs Invalid input(s): PROCALCITONIN,  WBC,  LACTICIDVEN Microbiology Recent Results (from the past 240 hour(s))  Resp Panel by RT-PCR (Flu A&B, Covid) Nasopharyngeal Swab     Status: None   Collection Time: 09/28/20  2:17 PM   Specimen: Nasopharyngeal Swab; Nasopharyngeal(NP) swabs in vial transport medium  Result Value Ref Range Status   SARS Coronavirus 2 by RT PCR NEGATIVE NEGATIVE Final    Comment: (NOTE) SARS-CoV-2 target nucleic acids are NOT DETECTED.  The SARS-CoV-2 RNA is generally detectable in upper respiratory specimens during the acute phase of infection. The lowest concentration of SARS-CoV-2 viral copies this assay can detect is 138 copies/mL. A negative result does not preclude SARS-Cov-2 infection and should not be used as the sole basis for treatment or other patient management decisions. A negative result may occur with  improper specimen collection/handling, submission of specimen other than nasopharyngeal swab, presence of viral mutation(s) within the areas targeted by this assay, and inadequate number of viral copies(<138 copies/mL). A negative result must be combined with clinical observations, patient history, and epidemiological information. The expected result is Negative.  Fact Sheet for Patients:  EntrepreneurPulse.com.au  Fact Sheet for Healthcare Providers:  IncredibleEmployment.be  This test is no t yet approved or cleared by the [place] FDA and  has been authorized for detection and/or diagnosis of SARS-CoV-2 by FDA under  an Emergency Use Authorization (EUA). This EUA will remain  in effect (meaning this test can be used) for the duration of the COVID-19 declaration under Section 564(b)(1) of the Act, 21 U.S.C.section 360bbb-3(b)(1), unless the authorization is terminated  or revoked sooner.       Influenza A by PCR NEGATIVE NEGATIVE Final   Influenza B by PCR NEGATIVE NEGATIVE Final    Comment: (NOTE) The Xpert Xpress SARS-CoV-2/FLU/RSV plus assay is intended as an aid in the diagnosis of influenza from Nasopharyngeal swab specimens and should not be used as a sole  basis for treatment. Nasal washings and aspirates are unacceptable for Xpert Xpress SARS-CoV-2/FLU/RSV testing.  Fact Sheet for Patients: EntrepreneurPulse.com.au  Fact Sheet for Healthcare Providers: IncredibleEmployment.be  This test is not yet approved or cleared by the [place] FDA and has been authorized for detection and/or diagnosis of SARS-CoV-2 by FDA under an Emergency Use Authorization (EUA). This EUA will remain in effect (meaning this test can be used) for the duration of the COVID-19 declaration under Section 564(b)(1) of the Act, 21 U.S.C. section 360bbb-3(b)(1), unless the authorization is terminated or revoked.  Performed at [facility], [place] [address], [place], [place] [address]      Patient was seen and examined on the day of discharge and was found to be in stable condition. Time coordinating discharge: 35 minutes including assessment and coordination of care, as well as examination of the patient.   SIGNED:  Dessa Phi, DO [facility] 09/29/2020, 1:57 PM

## 2020-09-29 NOTE — Progress Notes (Signed)
Referring Physician(s): Ronnette Juniper (GI); Sherwood Gambler (EDP)  Supervising Physician: Markus Daft  Patient Status:  [facility] - In-pt  Chief Complaint: GI bleed embolization F/U  Subjective:  History of acute lower GI bleed s/p mesenteric arteriogram with embolization of the origin of a tiny distal arcade and the adjacent short segment of the marginal artery using coils via right femoral approach in IR 09/28/2020. Patient awake and alert sitting in bed eating soup with no complaints at this time. Right femoral puncture site c/d/i. Hgb 8.2 following procedure.   Allergies: Patient has no known allergies.  Medications: Prior to Admission medications   Medication Sig Start Date End Date Taking? Authorizing Provider  ARMOUR THYROID 120 MG tablet Take 120 mg by mouth daily. 09/13/20  Yes [provider]  aspirin-acetaminophen-caffeine (EXCEDRIN MIGRAINE) [phone] MG tablet Take 2 tablets by mouth every 6 (six) hours as needed for headache.   Yes [provider]  butalbital-aspirin-caffeine Acquanetta Chain) 50-325-40 MG capsule Take 1 capsule by mouth every 4 (four) hours as needed for headache. 08/16/20  Yes [provider]  CALCIUM PO Take 1 tablet by mouth daily.   Yes [provider]  Carboxymethylcellulose Sodium (EYE DROPS OP) Apply 2 drops to eye 2 (two) times daily as needed (dry eyes).   Yes [provider]  CHONDROITIN SULFATE PO Take 1 tablet by mouth daily.   Yes [provider]  ciprofloxacin (CIPRO) 250 MG tablet Take 500 mg by mouth 2 (two) times daily. 7 day supply 09/21/20  Yes [provider]  Digestive Enzymes (ENZYME DIGEST) CAPS Take 2 capsules by mouth daily.   Yes [provider]  estradiol (VIVELLE-DOT) 0.05 MG/24HR patch Place 0.05 mg onto the skin once a week. 08/15/18  Yes [provider]  fluticasone (FLONASE) 50 MCG/ACT nasal spray Place 2 sprays into both nostrils daily. 09/04/18  Yes  [provider]  Magnesium 110 MG CAPS Take 2 capsules by mouth 2 (two) times daily.   Yes [provider]  metroNIDAZOLE (FLAGYL) 500 MG tablet Take 500 mg by mouth 3 (three) times daily. 10 day supply 09/21/20  Yes [provider]  olmesartan (BENICAR) 20 MG tablet Take 20 mg by mouth daily. 09/17/20  Yes [provider]  Omega-3 Fatty Acids (FISH OIL PO) Take 1 tablet by mouth 2 (two) times daily.   Yes [provider]  OVER THE COUNTER MEDICATION Take 1 tablet by mouth daily. Bone up bone supplement   Yes [provider]  progesterone (PROMETRIUM) 200 MG capsule Take 200 mg by mouth at bedtime. 08/13/18  Yes [provider]  rosuvastatin (CRESTOR) 10 MG tablet Take 10 mg by mouth daily. 09/17/20  Yes [provider]  Thiamine HCl (VITAMIN B-1 PO) Take 1 tablet by mouth daily.   Yes [provider]  VITAMIN D, CHOLECALCIFEROL, PO Take 10,000 Units by mouth daily.    Yes [provider]  Zinc Sulfate 66 MG TABS Take 132 mg by mouth daily.   Yes [provider]     Vital Signs: BP (!) 95/56 (BP Location: Left Arm)   Pulse 92   Temp 97.6 F (36.4 C) (Oral)   Resp 18   Ht _0  (1.676 m)   Wt 205 lb 14.6 oz (93.4 kg)   SpO2 97%   BMI 33.23 kg/m   Physical Exam Vitals and nursing note reviewed.  Constitutional:      General: She is not in acute distress.  Appearance: Normal appearance.  Pulmonary:     Effort: Pulmonary effort is normal. No respiratory distress.  Skin:    General: Skin is warm and dry.     Comments: Right femoral puncture site soft without active bleeding or hematoma. Distal pulses (DPs) 1+ bilaterally.  Neurological:     Mental Status: She is alert.     Imaging: IR Angiogram Visceral Selective  Result Date: 09/28/2020 INDICATION: Acute lower GI bleeding with positive CTA. Patient presents for mesenteric arteriogram potential percutaneous embolization. EXAM: 1.  ULTRASOUND GUIDANCE FOR ARTERIAL ACCESS 2. SELECTIVE SUPERIOR MESENTERIC ARTERIOGRAM 3. SELECTIVE INFERIOR MESENTERIC ARTERIOGRAM 4. SUB SELECTIVE ARTERIOGRAM OF THE LEFT COLIC ARTERY 5. SUB SELECTIVE ARTERIOGRAM MARGINAL ARTERY AND PERCUTANEOUS COIL EMBOLIZATION OF THE SEGMENT SUPPLYING THE ORIGIN OF THE ARCADE DEMONSTRATING ACTIVE EXTRAVASATION 6. SUB SELECTIVE ARTERIOGRAM AND PERCUTANEOUS COIL EMBOLIZATION OF DISTAL ARCADE SUPPLYING AREA OF EXTRAVASATION WITHIN THE DESCENDING COLON COMPARISON:  CTA abdomen and pelvis-earlier same day MEDICATIONS: None ANESTHESIA/SEDATION: Moderate (conscious) sedation was employed during this procedure. A total of Versed 4 mg and Fentanyl 150 mcg was administered intravenously. Moderate Sedation Time: 61 minutes. The patient's level of consciousness and vital signs were monitored continuously by radiology nursing throughout the procedure under my direct supervision. CONTRAST:  70 cc Omnipaque 300 FLUOROSCOPY TIME:  15 minutes, 12 seconds (6,644 mGy) COMPLICATIONS: None immediate. PROCEDURE: Informed consent was obtained from the patient following explanation of the procedure, risks, benefits and alternatives. All questions were addressed. A time out was performed prior to the initiation of the procedure. Maximal barrier sterile technique utilized including caps, mask, sterile gowns, sterile gloves, large sterile drape, hand hygiene, and Betadine prep. The right femoral head was marked fluoroscopically. Under sterile conditions and local anesthesia, the right common femoral artery access was performed with a micropuncture needle. Under direct ultrasound guidance, the right common femoral was accessed with a micropuncture kit. An ultrasound image was saved for documentation purposes. This allowed for placement of a 5-French vascular sheath. A limited arteriogram was performed through the side arm of the sheath confirming appropriate access within the right common femoral artery.  Over a Bentson wire, a Mickelson catheter was advanced the caudal aspect of the thoracic aorta where was reformed, back bled and flushed. The Mickelson catheter was then utilized to select the superior mesenteric artery and a selective superior mesenteric arteriogram was performed. Mickelson catheter was then utilized to select the IMA and a selective inferior mesenteric arteriogram was performed Next, with the use of a fathom 14 microwire, a regular Renegade microcatheter was advanced into the left colic artery and a selective left colic arteriogram was performed. The microcatheter was then advanced to select the marginal artery and a selective marginal artery arteriogram was performed. The segment of the marginal artery distal to the origin of the arcade supplying the ill-defined area of extravasation within the descending colon was then percutaneously coil embolized with several overlapping 2 mm diameter interlock coils. The tiny arcade supplying the ill-defined area of extravasation within the descending colon was able to be cannulated with the microwire, however despite prolonged efforts, the microcatheter could NOT be advanced into the vessel given the small size of the vessel as well as associated spasm. Regardless, the origin of the vessel was successfully percutaneously coil embolized with a 2 mm diameter interlock coil. The supplying/upstream segment of the marginal artery was then percutaneously coil embolized with a 3 mm diameter interlock coils. The microcatheter was retracted centrally and completion arteriograms were performed. Images were  reviewed and the procedure was terminated. All wires, catheters and sheaths were removed from the patient. Hemostasis was achieved at the right groin access site with manual compression. The patient tolerated the procedure well without immediate post procedural complication. FINDINGS: Selective superior mesenteric arteriogram redemonstrates a replaced right hepatic  artery arising from the proximal SMA as demonstrated on preceding CTA. Otherwise, conventional branching pattern. There are no discrete areas of vessel irregularity or intraluminal extravasation. Selective inferior mesenteric arteriogram demonstrates a conventional branching pattern with ill-defined area of extravasation involving the mid/distal aspect of the descending colon (only seen in hind site). While selective left colic arteriograms were negative for discrete areas intraluminal contrast extravasation, sub selective injection of the marginal artery supplying the caudal division of the IMA demonstrates an ill-defined area of intraluminal contrast extravasation within the distal descending colon via a tiny distal arcade vessel compatible with the findings on preceding CTA. While the vessel was able to be successfully catheterized, the microcatheter was could not be advanced into the vessel given the small size of the vessel and associated vasospasm in the setting of active extravasation. As such, the origin of the arcade as well as the segment of the marginal artery both proximal and distal to the origin of the arcade was successfully percutaneously coil embolized with overlapping interlock coils. Completion arteriogram demonstrate a technically excellent result with no definitive residual collateral arterial supply to the ill-defined area of extravasation involving the distal descending colon. IMPRESSION: Technically successful percutaneous coil embolization the origin of a tiny distal arcade and the adjacent short segment of the marginal artery supplying an ill-defined area of intraluminal contrast extravasation within the distal descending colon correlating with the findings seen on preceding CTA. PLAN: - The patient is to remain flat for 4 hours with right leg straight. - The patient will continue to experience several additional bloody bowel movements and may continue to require additional resuscitation  (as she was bleeding both before and during the procedure), however ultimately I am hopeful she will stabilize in the coming days. - While presumably secondary to diverticular disease, if not recently performed, further evaluation with colonoscopy after the resolution of acute symptoms is advised to exclude the presence of an underlying mass/lesion. Electronically Signed   By: Sandi Mariscal M.D.   On: 09/28/2020 17:45   IR Angiogram Visceral Selective  Result Date: 09/28/2020 INDICATION: Acute lower GI bleeding with positive CTA. Patient presents for mesenteric arteriogram potential percutaneous embolization. EXAM: 1. ULTRASOUND GUIDANCE FOR ARTERIAL ACCESS 2. SELECTIVE SUPERIOR MESENTERIC ARTERIOGRAM 3. SELECTIVE INFERIOR MESENTERIC ARTERIOGRAM 4. SUB SELECTIVE ARTERIOGRAM OF THE LEFT COLIC ARTERY 5. SUB SELECTIVE ARTERIOGRAM MARGINAL ARTERY AND PERCUTANEOUS COIL EMBOLIZATION OF THE SEGMENT SUPPLYING THE ORIGIN OF THE ARCADE DEMONSTRATING ACTIVE EXTRAVASATION 6. SUB SELECTIVE ARTERIOGRAM AND PERCUTANEOUS COIL EMBOLIZATION OF DISTAL ARCADE SUPPLYING AREA OF EXTRAVASATION WITHIN THE DESCENDING COLON COMPARISON:  CTA abdomen and pelvis-earlier same day MEDICATIONS: None ANESTHESIA/SEDATION: Moderate (conscious) sedation was employed during this procedure. A total of Versed 4 mg and Fentanyl 150 mcg was administered intravenously. Moderate Sedation Time: 61 minutes. The patient's level of consciousness and vital signs were monitored continuously by radiology nursing throughout the procedure under my direct supervision. CONTRAST:  70 cc Omnipaque 300 FLUOROSCOPY TIME:  15 minutes, 12 seconds (0,240 mGy) COMPLICATIONS: None immediate. PROCEDURE: Informed consent was obtained from the patient following explanation of the procedure, risks, benefits and alternatives. All questions were addressed. A time out was performed prior to the initiation of the procedure. Maximal barrier  sterile technique utilized including caps,  mask, sterile gowns, sterile gloves, large sterile drape, hand hygiene, and Betadine prep. The right femoral head was marked fluoroscopically. Under sterile conditions and local anesthesia, the right common femoral artery access was performed with a micropuncture needle. Under direct ultrasound guidance, the right common femoral was accessed with a micropuncture kit. An ultrasound image was saved for documentation purposes. This allowed for placement of a 5-French vascular sheath. A limited arteriogram was performed through the side arm of the sheath confirming appropriate access within the right common femoral artery. Over a Bentson wire, a Mickelson catheter was advanced the caudal aspect of the thoracic aorta where was reformed, back bled and flushed. The Mickelson catheter was then utilized to select the superior mesenteric artery and a selective superior mesenteric arteriogram was performed. Mickelson catheter was then utilized to select the IMA and a selective inferior mesenteric arteriogram was performed Next, with the use of a fathom 14 microwire, a regular Renegade microcatheter was advanced into the left colic artery and a selective left colic arteriogram was performed. The microcatheter was then advanced to select the marginal artery and a selective marginal artery arteriogram was performed. The segment of the marginal artery distal to the origin of the arcade supplying the ill-defined area of extravasation within the descending colon was then percutaneously coil embolized with several overlapping 2 mm diameter interlock coils. The tiny arcade supplying the ill-defined area of extravasation within the descending colon was able to be cannulated with the microwire, however despite prolonged efforts, the microcatheter could NOT be advanced into the vessel given the small size of the vessel as well as associated spasm. Regardless, the origin of the vessel was successfully percutaneously coil embolized with a  2 mm diameter interlock coil. The supplying/upstream segment of the marginal artery was then percutaneously coil embolized with a 3 mm diameter interlock coils. The microcatheter was retracted centrally and completion arteriograms were performed. Images were reviewed and the procedure was terminated. All wires, catheters and sheaths were removed from the patient. Hemostasis was achieved at the right groin access site with manual compression. The patient tolerated the procedure well without immediate post procedural complication. 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 Otherwise, conventional branching pattern. There are no discrete areas of vessel irregularity or intraluminal extravasation. Selective inferior mesenteric arteriogram demonstrates a conventional branching pattern with ill-defined area of extravasation involving the mid/distal aspect of the descending colon (only seen in hind site). While selective left colic arteriograms were negative for discrete areas intraluminal contrast extravasation, sub selective injection of the marginal artery supplying the caudal division of the IMA demonstrates an ill-defined area of intraluminal contrast extravasation within the distal descending colon via a tiny distal arcade vessel compatible with the findings on preceding CTA. While the vessel was able to be successfully catheterized, the microcatheter was could not be advanced into the vessel given the small size of the vessel and associated vasospasm in the setting of active extravasation. As such, the origin of the arcade as well as the segment of the marginal artery both proximal and distal to the origin of the arcade was successfully percutaneously coil embolized with overlapping interlock coils. Completion arteriogram demonstrate a technically excellent result with no definitive residual  collateral arterial supply to the ill-defined area of extravasation involving the distal descending colon. IMPRESSION: Technically successful percutaneous coil embolization the origin of a tiny distal arcade and the  adjacent short segment of the marginal artery supplying an ill-defined area of intraluminal contrast extravasation within the distal descending colon correlating with the findings seen on preceding CTA. PLAN: - The patient is to remain flat for 4 hours with right leg straight. - The patient will continue to experience several additional bloody bowel movements and may continue to require additional resuscitation (as she was bleeding both before and during the procedure), however ultimately I am hopeful she will stabilize in the coming days. - While presumably secondary to diverticular disease, if not recently performed, further evaluation with colonoscopy after the resolution of acute symptoms is advised to exclude the presence of an underlying mass/lesion. Electronically Signed   By: Sandi Mariscal M.D.   On: 09/28/2020 17:45   IR Angiogram Selective Each Additional Vessel  Result Date: 09/28/2020 INDICATION: Acute lower GI bleeding with positive CTA. Patient presents for mesenteric arteriogram potential percutaneous embolization. EXAM: 1. ULTRASOUND GUIDANCE FOR ARTERIAL ACCESS 2. SELECTIVE SUPERIOR MESENTERIC ARTERIOGRAM 3. SELECTIVE INFERIOR MESENTERIC ARTERIOGRAM 4. SUB SELECTIVE ARTERIOGRAM OF THE LEFT COLIC ARTERY 5. SUB SELECTIVE ARTERIOGRAM MARGINAL ARTERY AND PERCUTANEOUS COIL EMBOLIZATION OF THE SEGMENT SUPPLYING THE ORIGIN OF THE ARCADE DEMONSTRATING ACTIVE EXTRAVASATION 6. SUB SELECTIVE ARTERIOGRAM AND PERCUTANEOUS COIL EMBOLIZATION OF DISTAL ARCADE SUPPLYING AREA OF EXTRAVASATION WITHIN THE DESCENDING COLON COMPARISON:  CTA abdomen and pelvis-earlier same day MEDICATIONS: None ANESTHESIA/SEDATION: Moderate (conscious) sedation was employed during this procedure. A total of Versed 4 mg and  Fentanyl 150 mcg was administered intravenously. Moderate Sedation Time: 61 minutes. The patient's level of consciousness and vital signs were monitored continuously by radiology nursing throughout the procedure under my direct supervision. CONTRAST:  70 cc Omnipaque 300 FLUOROSCOPY TIME:  15 minutes, 12 seconds (8,016 mGy) COMPLICATIONS: None immediate. PROCEDURE: Informed consent was obtained from the patient following explanation of the procedure, risks, benefits and alternatives. All questions were addressed. A time out was performed prior to the initiation of the procedure. Maximal barrier sterile technique utilized including caps, mask, sterile gowns, sterile gloves, large sterile drape, hand hygiene, and Betadine prep. The right femoral head was marked fluoroscopically. Under sterile conditions and local anesthesia, the right common femoral artery access was performed with a micropuncture needle. Under direct ultrasound guidance, the right common femoral was accessed with a micropuncture kit. An ultrasound image was saved for documentation purposes. This allowed for placement of a 5-French vascular sheath. A limited arteriogram was performed through the side arm of the sheath confirming appropriate access within the right common femoral artery. Over a Bentson wire, a Mickelson catheter was advanced the caudal aspect of the thoracic aorta where was reformed, back bled and flushed. The Mickelson catheter was then utilized to select the superior mesenteric artery and a selective superior mesenteric arteriogram was performed. Mickelson catheter was then utilized to select the IMA and a selective inferior mesenteric arteriogram was performed Next, with the use of a fathom 14 microwire, a regular Renegade microcatheter was advanced into the left colic artery and a selective left colic arteriogram was performed. The microcatheter was then advanced to select the marginal artery and a selective marginal artery  arteriogram was performed. The segment of the marginal artery distal to the origin of the arcade supplying the ill-defined area of extravasation within the descending colon was then percutaneously coil embolized with several overlapping 2 mm diameter interlock coils. The tiny arcade supplying the ill-defined area of extravasation within the descending colon was able to be cannulated with the microwire, however despite prolonged efforts, the  microcatheter could NOT be advanced into the vessel given the small size of the vessel as well as associated spasm. Regardless, the origin of the vessel was successfully percutaneously coil embolized with a 2 mm diameter interlock coil. The supplying/upstream segment of the marginal artery was then percutaneously coil embolized with a 3 mm diameter interlock coils. The microcatheter was retracted centrally and completion arteriograms were performed. Images were reviewed and the procedure was terminated. All wires, catheters and sheaths were removed from the patient. Hemostasis was achieved at the right groin access site with manual compression. The patient tolerated the procedure well without immediate post procedural complication. 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 Otherwise, conventional branching pattern. There are no discrete areas of vessel irregularity or intraluminal extravasation. Selective inferior mesenteric arteriogram demonstrates a conventional branching pattern with ill-defined area of extravasation involving the mid/distal aspect of the descending colon (only seen in hind site). While selective left colic arteriograms were negative for discrete areas intraluminal contrast extravasation, sub selective injection of the marginal artery supplying the caudal division of the IMA demonstrates an ill-defined area of intraluminal contrast  extravasation within the distal descending colon via a tiny distal arcade vessel compatible with the findings on preceding CTA. While the vessel was able to be successfully catheterized, the microcatheter was could not be advanced into the vessel given the small size of the vessel and associated vasospasm in the setting of active extravasation. As such, the origin of the arcade as well as the segment of the marginal artery both proximal and distal to the origin of the arcade was successfully percutaneously coil embolized with overlapping interlock coils. Completion arteriogram demonstrate a technically excellent result with no definitive residual collateral arterial supply to the ill-defined area of extravasation involving the distal descending colon. IMPRESSION: Technically successful percutaneous coil embolization the origin of a tiny distal arcade and the adjacent short segment of the marginal artery supplying an ill-defined area of intraluminal contrast extravasation within the distal descending colon correlating with the findings seen on preceding CTA. PLAN: - The patient is to remain flat for 4 hours with right leg straight. - The patient will continue to experience several additional bloody bowel movements and may continue to require additional resuscitation (as she was bleeding both before and during the procedure), however ultimately I am hopeful she will stabilize in the coming days. - While presumably secondary to diverticular disease, if not recently performed, further evaluation with colonoscopy after the resolution of acute symptoms is advised to exclude the presence of an underlying mass/lesion. Electronically Signed   By: Sandi Mariscal M.D.   On: 09/28/2020 17:45   IR Angiogram Selective Each Additional Vessel  Result Date: 09/28/2020 INDICATION: Acute lower GI bleeding with positive CTA. Patient presents for mesenteric arteriogram potential percutaneous embolization. EXAM: 1. ULTRASOUND GUIDANCE  FOR ARTERIAL ACCESS 2. SELECTIVE SUPERIOR MESENTERIC ARTERIOGRAM 3. SELECTIVE INFERIOR MESENTERIC ARTERIOGRAM 4. SUB SELECTIVE ARTERIOGRAM OF THE LEFT COLIC ARTERY 5. SUB SELECTIVE ARTERIOGRAM MARGINAL ARTERY AND PERCUTANEOUS COIL EMBOLIZATION OF THE SEGMENT SUPPLYING THE ORIGIN OF THE ARCADE DEMONSTRATING ACTIVE EXTRAVASATION 6. SUB SELECTIVE ARTERIOGRAM AND PERCUTANEOUS COIL EMBOLIZATION OF DISTAL ARCADE SUPPLYING AREA OF EXTRAVASATION WITHIN THE DESCENDING COLON COMPARISON:  CTA abdomen and pelvis-earlier same day MEDICATIONS: None ANESTHESIA/SEDATION: Moderate (conscious) sedation was employed during this procedure. A total of Versed 4 mg and Fentanyl 150 mcg was administered intravenously. Moderate Sedation Time: 61 minutes. The patient's level  of consciousness and vital signs were monitored continuously by radiology nursing throughout the procedure under my direct supervision. CONTRAST:  70 cc Omnipaque 300 FLUOROSCOPY TIME:  15 minutes, 12 seconds (5,974 mGy) COMPLICATIONS: None immediate. PROCEDURE: Informed consent was obtained from the patient following explanation of the procedure, risks, benefits and alternatives. All questions were addressed. A time out was performed prior to the initiation of the procedure. Maximal barrier sterile technique utilized including caps, mask, sterile gowns, sterile gloves, large sterile drape, hand hygiene, and Betadine prep. The right femoral head was marked fluoroscopically. Under sterile conditions and local anesthesia, the right common femoral artery access was performed with a micropuncture needle. Under direct ultrasound guidance, the right common femoral was accessed with a micropuncture kit. An ultrasound image was saved for documentation purposes. This allowed for placement of a 5-French vascular sheath. A limited arteriogram was performed through the side arm of the sheath confirming appropriate access within the right common femoral artery. Over a Bentson wire, a  Mickelson catheter was advanced the caudal aspect of the thoracic aorta where was reformed, back bled and flushed. The Mickelson catheter was then utilized to select the superior mesenteric artery and a selective superior mesenteric arteriogram was performed. Mickelson catheter was then utilized to select the IMA and a selective inferior mesenteric arteriogram was performed Next, with the use of a fathom 14 microwire, a regular Renegade microcatheter was advanced into the left colic artery and a selective left colic arteriogram was performed. The microcatheter was then advanced to select the marginal artery and a selective marginal artery arteriogram was performed. The segment of the marginal artery distal to the origin of the arcade supplying the ill-defined area of extravasation within the descending colon was then percutaneously coil embolized with several overlapping 2 mm diameter interlock coils. The tiny arcade supplying the ill-defined area of extravasation within the descending colon was able to be cannulated with the microwire, however despite prolonged efforts, the microcatheter could NOT be advanced into the vessel given the small size of the vessel as well as associated spasm. Regardless, the origin of the vessel was successfully percutaneously coil embolized with a 2 mm diameter interlock coil. The supplying/upstream segment of the marginal artery was then percutaneously coil embolized with a 3 mm diameter interlock coils. The microcatheter was retracted centrally and completion arteriograms were performed. Images were reviewed and the procedure was terminated. All wires, catheters and sheaths were removed from the patient. Hemostasis was achieved at the right groin access site with manual compression. The patient tolerated the procedure well without immediate post procedural complication. FINDINGS: Selective superior mesenteric arteriogram redemonstrates a replaced right hepatic artery arising from the  proximal SMA as demonstrated on preceding CTA. Otherwise, conventional branching pattern. There are no discrete areas of vessel irregularity or intraluminal extravasation. Selective inferior mesenteric arteriogram demonstrates a conventional branching pattern with ill-defined area of extravasation involving the mid/distal aspect of the descending colon (only seen in hind site). While selective left colic arteriograms were negative for discrete areas intraluminal contrast extravasation, sub selective injection of the marginal artery supplying the caudal division of the IMA demonstrates an ill-defined area of intraluminal contrast extravasation within the distal descending colon via a tiny distal arcade vessel compatible with the findings on preceding CTA. While the vessel was able to be successfully catheterized, the microcatheter was could not be advanced into the vessel given the small size of the vessel and associated vasospasm in the setting of active extravasation. As such, the origin of  the arcade as well as the segment of the marginal artery both proximal and distal to the origin of the arcade was successfully percutaneously coil embolized with overlapping interlock coils. Completion arteriogram demonstrate a technically excellent result with no definitive residual collateral arterial supply to the ill-defined area of extravasation involving the distal descending colon. IMPRESSION: Technically successful percutaneous coil embolization the origin of a tiny distal arcade and the adjacent short segment of the marginal artery supplying an ill-defined area of intraluminal contrast extravasation within the distal descending colon correlating with the findings seen on preceding CTA. PLAN: - The patient is to remain flat for 4 hours with right leg straight. - The patient will continue to experience several additional bloody bowel movements and may continue to require additional resuscitation (as she was bleeding both  before and during the procedure), however ultimately I am hopeful she will stabilize in the coming days. - While presumably secondary to diverticular disease, if not recently performed, further evaluation with colonoscopy after the resolution of acute symptoms is advised to exclude the presence of an underlying mass/lesion. Electronically Signed   By: Sandi Mariscal M.D.   On: 09/28/2020 17:45   IR Angiogram Selective Each Additional Vessel  Result Date: 09/28/2020 INDICATION: Acute lower GI bleeding with positive CTA. Patient presents for mesenteric arteriogram potential percutaneous embolization. EXAM: 1. ULTRASOUND GUIDANCE FOR ARTERIAL ACCESS 2. SELECTIVE SUPERIOR MESENTERIC ARTERIOGRAM 3. SELECTIVE INFERIOR MESENTERIC ARTERIOGRAM 4. SUB SELECTIVE ARTERIOGRAM OF THE LEFT COLIC ARTERY 5. SUB SELECTIVE ARTERIOGRAM MARGINAL ARTERY AND PERCUTANEOUS COIL EMBOLIZATION OF THE SEGMENT SUPPLYING THE ORIGIN OF THE ARCADE DEMONSTRATING ACTIVE EXTRAVASATION 6. SUB SELECTIVE ARTERIOGRAM AND PERCUTANEOUS COIL EMBOLIZATION OF DISTAL ARCADE SUPPLYING AREA OF EXTRAVASATION WITHIN THE DESCENDING COLON COMPARISON:  CTA abdomen and pelvis-earlier same day MEDICATIONS: None ANESTHESIA/SEDATION: Moderate (conscious) sedation was employed during this procedure. A total of Versed 4 mg and Fentanyl 150 mcg was administered intravenously. Moderate Sedation Time: 61 minutes. The patient's level of consciousness and vital signs were monitored continuously by radiology nursing throughout the procedure under my direct supervision. CONTRAST:  70 cc Omnipaque 300 FLUOROSCOPY TIME:  15 minutes, 12 seconds (8,099 mGy) COMPLICATIONS: None immediate. PROCEDURE: Informed consent was obtained from the patient following explanation of the procedure, risks, benefits and alternatives. All questions were addressed. A time out was performed prior to the initiation of the procedure. Maximal barrier sterile technique utilized including caps, mask, sterile  gowns, sterile gloves, large sterile drape, hand hygiene, and Betadine prep. The right femoral head was marked fluoroscopically. Under sterile conditions and local anesthesia, the right common femoral artery access was performed with a micropuncture needle. Under direct ultrasound guidance, the right common femoral was accessed with a micropuncture kit. An ultrasound image was saved for documentation purposes. This allowed for placement of a 5-French vascular sheath. A limited arteriogram was performed through the side arm of the sheath confirming appropriate access within the right common femoral artery. Over a Bentson wire, a Mickelson catheter was advanced the caudal aspect of the thoracic aorta where was reformed, back bled and flushed. The Mickelson catheter was then utilized to select the superior mesenteric artery and a selective superior mesenteric arteriogram was performed. Mickelson catheter was then utilized to select the IMA and a selective inferior mesenteric arteriogram was performed Next, with the use of a fathom 14 microwire, a regular Renegade microcatheter was advanced into the left colic artery and a selective left colic arteriogram was performed. The microcatheter was then advanced to select the marginal artery  and a selective marginal artery arteriogram was performed. The segment of the marginal artery distal to the origin of the arcade supplying the ill-defined area of extravasation within the descending colon was then percutaneously coil embolized with several overlapping 2 mm diameter interlock coils. The tiny arcade supplying the ill-defined area of extravasation within the descending colon was able to be cannulated with the microwire, however despite prolonged efforts, the microcatheter could NOT be advanced into the vessel given the small size of the vessel as well as associated spasm. Regardless, the origin of the vessel was successfully percutaneously coil embolized with a 2 mm diameter  interlock coil. The supplying/upstream segment of the marginal artery was then percutaneously coil embolized with a 3 mm diameter interlock coils. The microcatheter was retracted centrally and completion arteriograms were performed. Images were reviewed and the procedure was terminated. All wires, catheters and sheaths were removed from the patient. Hemostasis was achieved at the right groin access site with manual compression. The patient tolerated the procedure well without immediate post procedural complication. 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 Otherwise, conventional branching pattern. There are no discrete areas of vessel irregularity or intraluminal extravasation. Selective inferior mesenteric arteriogram demonstrates a conventional branching pattern with ill-defined area of extravasation involving the mid/distal aspect of the descending colon (only seen in hind site). While selective left colic arteriograms were negative for discrete areas intraluminal contrast extravasation, sub selective injection of the marginal artery supplying the caudal division of the IMA demonstrates an ill-defined area of intraluminal contrast extravasation within the distal descending colon via a tiny distal arcade vessel compatible with the findings on preceding CTA. While the vessel was able to be successfully catheterized, the microcatheter was could not be advanced into the vessel given the small size of the vessel and associated vasospasm in the setting of active extravasation. As such, the origin of the arcade as well as the segment of the marginal artery both proximal and distal to the origin of the arcade was successfully percutaneously coil embolized with overlapping interlock coils. Completion arteriogram demonstrate a technically excellent result with no definitive residual collateral arterial  supply to the ill-defined area of extravasation involving the distal descending colon. IMPRESSION: Technically successful percutaneous coil embolization the origin of a tiny distal arcade and the adjacent short segment of the marginal artery supplying an ill-defined area of intraluminal contrast extravasation within the distal descending colon correlating with the findings seen on preceding CTA. PLAN: - The patient is to remain flat for 4 hours with right leg straight. - The patient will continue to experience several additional bloody bowel movements and may continue to require additional resuscitation (as she was bleeding both before and during the procedure), however ultimately I am hopeful she will stabilize in the coming days. - While presumably secondary to diverticular disease, if not recently performed, further evaluation with colonoscopy after the resolution of acute symptoms is advised to exclude the presence of an underlying mass/lesion. Electronically Signed   By: Sandi Mariscal M.D.   On: 09/28/2020 17:45   IR Angiogram Selective Each Additional Vessel  Result Date: 09/28/2020 INDICATION: Acute lower GI bleeding with positive CTA. Patient presents for mesenteric arteriogram potential percutaneous embolization. EXAM: 1. ULTRASOUND GUIDANCE FOR ARTERIAL ACCESS 2. SELECTIVE SUPERIOR MESENTERIC ARTERIOGRAM 3. SELECTIVE INFERIOR MESENTERIC ARTERIOGRAM 4. SUB SELECTIVE ARTERIOGRAM OF THE LEFT COLIC ARTERY 5. SUB SELECTIVE ARTERIOGRAM MARGINAL ARTERY AND PERCUTANEOUS COIL EMBOLIZATION OF  THE SEGMENT SUPPLYING THE ORIGIN OF THE ARCADE DEMONSTRATING ACTIVE EXTRAVASATION 6. SUB SELECTIVE ARTERIOGRAM AND PERCUTANEOUS COIL EMBOLIZATION OF DISTAL ARCADE SUPPLYING AREA OF EXTRAVASATION WITHIN THE DESCENDING COLON COMPARISON:  CTA abdomen and pelvis-earlier same day MEDICATIONS: None ANESTHESIA/SEDATION: Moderate (conscious) sedation was employed during this procedure. A total of Versed 4 mg and Fentanyl 150 mcg was  administered intravenously. Moderate Sedation Time: 61 minutes. The patient's level of consciousness and vital signs were monitored continuously by radiology nursing throughout the procedure under my direct supervision. CONTRAST:  70 cc Omnipaque 300 FLUOROSCOPY TIME:  15 minutes, 12 seconds (7,106 mGy) COMPLICATIONS: None immediate. PROCEDURE: Informed consent was obtained from the patient following explanation of the procedure, risks, benefits and alternatives. All questions were addressed. A time out was performed prior to the initiation of the procedure. Maximal barrier sterile technique utilized including caps, mask, sterile gowns, sterile gloves, large sterile drape, hand hygiene, and Betadine prep. The right femoral head was marked fluoroscopically. Under sterile conditions and local anesthesia, the right common femoral artery access was performed with a micropuncture needle. Under direct ultrasound guidance, the right common femoral was accessed with a micropuncture kit. An ultrasound image was saved for documentation purposes. This allowed for placement of a 5-French vascular sheath. A limited arteriogram was performed through the side arm of the sheath confirming appropriate access within the right common femoral artery. Over a Bentson wire, a Mickelson catheter was advanced the caudal aspect of the thoracic aorta where was reformed, back bled and flushed. The Mickelson catheter was then utilized to select the superior mesenteric artery and a selective superior mesenteric arteriogram was performed. Mickelson catheter was then utilized to select the IMA and a selective inferior mesenteric arteriogram was performed Next, with the use of a fathom 14 microwire, a regular Renegade microcatheter was advanced into the left colic artery and a selective left colic arteriogram was performed. The microcatheter was then advanced to select the marginal artery and a selective marginal artery arteriogram was performed.  The segment of the marginal artery distal to the origin of the arcade supplying the ill-defined area of extravasation within the descending colon was then percutaneously coil embolized with several overlapping 2 mm diameter interlock coils. The tiny arcade supplying the ill-defined area of extravasation within the descending colon was able to be cannulated with the microwire, however despite prolonged efforts, the microcatheter could NOT be advanced into the vessel given the small size of the vessel as well as associated spasm. Regardless, the origin of the vessel was successfully percutaneously coil embolized with a 2 mm diameter interlock coil. The supplying/upstream segment of the marginal artery was then percutaneously coil embolized with a 3 mm diameter interlock coils. The microcatheter was retracted centrally and completion arteriograms were performed. Images were reviewed and the procedure was terminated. All wires, catheters and sheaths were removed from the patient. Hemostasis was achieved at the right groin access site with manual compression. The patient tolerated the procedure well without immediate post procedural complication. 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 Otherwise, conventional branching pattern. There are no discrete areas of vessel irregularity or intraluminal extravasation. Selective inferior mesenteric arteriogram demonstrates a conventional branching pattern with ill-defined area of extravasation involving the mid/distal aspect of the descending colon (only seen in hind site). While selective left colic arteriograms were negative for discrete areas intraluminal contrast extravasation, sub selective injection of the marginal artery supplying the caudal  division of the IMA demonstrates an ill-defined area of intraluminal contrast extravasation within the distal  descending colon via a tiny distal arcade vessel compatible with the findings on preceding CTA. While the vessel was able to be successfully catheterized, the microcatheter was could not be advanced into the vessel given the small size of the vessel and associated vasospasm in the setting of active extravasation. As such, the origin of the arcade as well as the segment of the marginal artery both proximal and distal to the origin of the arcade was successfully percutaneously coil embolized with overlapping interlock coils. Completion arteriogram demonstrate a technically excellent result with no definitive residual collateral arterial supply to the ill-defined area of extravasation involving the distal descending colon. IMPRESSION: Technically successful percutaneous coil embolization the origin of a tiny distal arcade and the adjacent short segment of the marginal artery supplying an ill-defined area of intraluminal contrast extravasation within the distal descending colon correlating with the findings seen on preceding CTA. PLAN: - The patient is to remain flat for 4 hours with right leg straight. - The patient will continue to experience several additional bloody bowel movements and may continue to require additional resuscitation (as she was bleeding both before and during the procedure), however ultimately I am hopeful she will stabilize in the coming days. - While presumably secondary to diverticular disease, if not recently performed, further evaluation with colonoscopy after the resolution of acute symptoms is advised to exclude the presence of an underlying mass/lesion. Electronically Signed   By: Sandi Mariscal M.D.   On: 09/28/2020 17:45   IR US Guide Vasc Access Right  Result Date: 09/28/2020 INDICATION: Acute lower GI bleeding with positive CTA. Patient presents for mesenteric arteriogram potential percutaneous embolization. EXAM: 1. ULTRASOUND GUIDANCE FOR ARTERIAL ACCESS 2. SELECTIVE SUPERIOR  MESENTERIC ARTERIOGRAM 3. SELECTIVE INFERIOR MESENTERIC ARTERIOGRAM 4. SUB SELECTIVE ARTERIOGRAM OF THE LEFT COLIC ARTERY 5. SUB SELECTIVE ARTERIOGRAM MARGINAL ARTERY AND PERCUTANEOUS COIL EMBOLIZATION OF THE SEGMENT SUPPLYING THE ORIGIN OF THE ARCADE DEMONSTRATING ACTIVE EXTRAVASATION 6. SUB SELECTIVE ARTERIOGRAM AND PERCUTANEOUS COIL EMBOLIZATION OF DISTAL ARCADE SUPPLYING AREA OF EXTRAVASATION WITHIN THE DESCENDING COLON COMPARISON:  CTA abdomen and pelvis-earlier same day MEDICATIONS: None ANESTHESIA/SEDATION: Moderate (conscious) sedation was employed during this procedure. A total of Versed 4 mg and Fentanyl 150 mcg was administered intravenously. Moderate Sedation Time: 61 minutes. The patient's level of consciousness and vital signs were monitored continuously by radiology nursing throughout the procedure under my direct supervision. CONTRAST:  70 cc Omnipaque 300 FLUOROSCOPY TIME:  15 minutes, 12 seconds (8,315 mGy) COMPLICATIONS: None immediate. PROCEDURE: Informed consent was obtained from the patient following explanation of the procedure, risks, benefits and alternatives. All questions were addressed. A time out was performed prior to the initiation of the procedure. Maximal barrier sterile technique utilized including caps, mask, sterile gowns, sterile gloves, large sterile drape, hand hygiene, and Betadine prep. The right femoral head was marked fluoroscopically. Under sterile conditions and local anesthesia, the right common femoral artery access was performed with a micropuncture needle. Under direct ultrasound guidance, the right common femoral was accessed with a micropuncture kit. An ultrasound image was saved for documentation purposes. This allowed for placement of a 5-French vascular sheath. A limited arteriogram was performed through the side arm of the sheath confirming appropriate access within the right common femoral artery. Over a Bentson wire, a Mickelson catheter was advanced the  caudal aspect of the thoracic aorta where was reformed, back bled and flushed. The Mickelson catheter was  then utilized to select the superior mesenteric artery and a selective superior mesenteric arteriogram was performed. Mickelson catheter was then utilized to select the IMA and a selective inferior mesenteric arteriogram was performed Next, with the use of a fathom 14 microwire, a regular Renegade microcatheter was advanced into the left colic artery and a selective left colic arteriogram was performed. The microcatheter was then advanced to select the marginal artery and a selective marginal artery arteriogram was performed. The segment of the marginal artery distal to the origin of the arcade supplying the ill-defined area of extravasation within the descending colon was then percutaneously coil embolized with several overlapping 2 mm diameter interlock coils. The tiny arcade supplying the ill-defined area of extravasation within the descending colon was able to be cannulated with the microwire, however despite prolonged efforts, the microcatheter could NOT be advanced into the vessel given the small size of the vessel as well as associated spasm. Regardless, the origin of the vessel was successfully percutaneously coil embolized with a 2 mm diameter interlock coil. The supplying/upstream segment of the marginal artery was then percutaneously coil embolized with a 3 mm diameter interlock coils. The microcatheter was retracted centrally and completion arteriograms were performed. Images were reviewed and the procedure was terminated. All wires, catheters and sheaths were removed from the patient. Hemostasis was achieved at the right groin access site with manual compression. The patient tolerated the procedure well without immediate post procedural complication. FINDINGS: Selective superior mesenteric arteriogram redemonstrates a replaced right hepatic artery arising from the proximal SMA as demonstrated on  preceding CTA. Otherwise, conventional branching pattern. There are no discrete areas of vessel irregularity or intraluminal extravasation. Selective inferior mesenteric arteriogram demonstrates a conventional branching pattern with ill-defined area of extravasation involving the mid/distal aspect of the descending colon (only seen in hind site). While selective left colic arteriograms were negative for discrete areas intraluminal contrast extravasation, sub selective injection of the marginal artery supplying the caudal division of the IMA demonstrates an ill-defined area of intraluminal contrast extravasation within the distal descending colon via a tiny distal arcade vessel compatible with the findings on preceding CTA. While the vessel was able to be successfully catheterized, the microcatheter was could not be advanced into the vessel given the small size of the vessel and associated vasospasm in the setting of active extravasation. As such, the origin of the arcade as well as the segment of the marginal artery both proximal and distal to the origin of the arcade was successfully percutaneously coil embolized with overlapping interlock coils. Completion arteriogram demonstrate a technically excellent result with no definitive residual collateral arterial supply to the ill-defined area of extravasation involving the distal descending colon. IMPRESSION: Technically successful percutaneous coil embolization the origin of a tiny distal arcade and the adjacent short segment of the marginal artery supplying an ill-defined area of intraluminal contrast extravasation within the distal descending colon correlating with the findings seen on preceding CTA. PLAN: - The patient is to remain flat for 4 hours with right leg straight. - The patient will continue to experience several additional bloody bowel movements and may continue to require additional resuscitation (as she was bleeding both before and during the  procedure), however ultimately I am hopeful she will stabilize in the coming days. - While presumably secondary to diverticular disease, if not recently performed, further evaluation with colonoscopy after the resolution of acute symptoms is advised to exclude the presence of an underlying mass/lesion. Electronically Signed   By: Sandi Mariscal  M.D.   On: 09/28/2020 17:45   IR EMBO ART  VEN HEMORR LYMPH EXTRAV  INC GUIDE ROADMAPPING  Result Date: 09/28/2020 INDICATION: Acute lower GI bleeding with positive CTA. Patient presents for mesenteric arteriogram potential percutaneous embolization. EXAM: 1. ULTRASOUND GUIDANCE FOR ARTERIAL ACCESS 2. SELECTIVE SUPERIOR MESENTERIC ARTERIOGRAM 3. SELECTIVE INFERIOR MESENTERIC ARTERIOGRAM 4. SUB SELECTIVE ARTERIOGRAM OF THE LEFT COLIC ARTERY 5. SUB SELECTIVE ARTERIOGRAM MARGINAL ARTERY AND PERCUTANEOUS COIL EMBOLIZATION OF THE SEGMENT SUPPLYING THE ORIGIN OF THE ARCADE DEMONSTRATING ACTIVE EXTRAVASATION 6. SUB SELECTIVE ARTERIOGRAM AND PERCUTANEOUS COIL EMBOLIZATION OF DISTAL ARCADE SUPPLYING AREA OF EXTRAVASATION WITHIN THE DESCENDING COLON COMPARISON:  CTA abdomen and pelvis-earlier same day MEDICATIONS: None ANESTHESIA/SEDATION: Moderate (conscious) sedation was employed during this procedure. A total of Versed 4 mg and Fentanyl 150 mcg was administered intravenously. Moderate Sedation Time: 61 minutes. The patient's level of consciousness and vital signs were monitored continuously by radiology nursing throughout the procedure under my direct supervision. CONTRAST:  70 cc Omnipaque 300 FLUOROSCOPY TIME:  15 minutes, 12 seconds (6,659 mGy) COMPLICATIONS: None immediate. PROCEDURE: Informed consent was obtained from the patient following explanation of the procedure, risks, benefits and alternatives. All questions were addressed. A time out was performed prior to the initiation of the procedure. Maximal barrier sterile technique utilized including caps, mask, sterile gowns,  sterile gloves, large sterile drape, hand hygiene, and Betadine prep. The right femoral head was marked fluoroscopically. Under sterile conditions and local anesthesia, the right common femoral artery access was performed with a micropuncture needle. Under direct ultrasound guidance, the right common femoral was accessed with a micropuncture kit. An ultrasound image was saved for documentation purposes. This allowed for placement of a 5-French vascular sheath. A limited arteriogram was performed through the side arm of the sheath confirming appropriate access within the right common femoral artery. Over a Bentson wire, a Mickelson catheter was advanced the caudal aspect of the thoracic aorta where was reformed, back bled and flushed. The Mickelson catheter was then utilized to select the superior mesenteric artery and a selective superior mesenteric arteriogram was performed. Mickelson catheter was then utilized to select the IMA and a selective inferior mesenteric arteriogram was performed Next, with the use of a fathom 14 microwire, a regular Renegade microcatheter was advanced into the left colic artery and a selective left colic arteriogram was performed. The microcatheter was then advanced to select the marginal artery and a selective marginal artery arteriogram was performed. The segment of the marginal artery distal to the origin of the arcade supplying the ill-defined area of extravasation within the descending colon was then percutaneously coil embolized with several overlapping 2 mm diameter interlock coils. The tiny arcade supplying the ill-defined area of extravasation within the descending colon was able to be cannulated with the microwire, however despite prolonged efforts, the microcatheter could NOT be advanced into the vessel given the small size of the vessel as well as associated spasm. Regardless, the origin of the vessel was successfully percutaneously coil embolized with a 2 mm diameter  interlock coil. The supplying/upstream segment of the marginal artery was then percutaneously coil embolized with a 3 mm diameter interlock coils. The microcatheter was retracted centrally and completion arteriograms were performed. Images were reviewed and the procedure was terminated. All wires, catheters and sheaths were removed from the patient. Hemostasis was achieved at the right groin access site with manual compression. The patient tolerated the procedure well without immediate post procedural complication. FINDINGS: Selective superior mesenteric arteriogram redemonstrates a replaced right  hepatic artery arising from the proximal SMA as demonstrated on preceding CTA. Otherwise, conventional branching pattern. There are no discrete areas of vessel irregularity or intraluminal extravasation. Selective inferior mesenteric arteriogram demonstrates a conventional branching pattern with ill-defined area of extravasation involving the mid/distal aspect of the descending colon (only seen in hind site). While selective left colic arteriograms were negative for discrete areas intraluminal contrast extravasation, sub selective injection of the marginal artery supplying the caudal division of the IMA demonstrates an ill-defined area of intraluminal contrast extravasation within the distal descending colon via a tiny distal arcade vessel compatible with the findings on preceding CTA. While the vessel was able to be successfully catheterized, the microcatheter was could not be advanced into the vessel given the small size of the vessel and associated vasospasm in the setting of active extravasation. As such, the origin of the arcade as well as the segment of the marginal artery both proximal and distal to the origin of the arcade was successfully percutaneously coil embolized with overlapping interlock coils. Completion arteriogram demonstrate a technically excellent result with no definitive residual collateral arterial  supply to the ill-defined area of extravasation involving the distal descending colon. IMPRESSION: Technically successful percutaneous coil embolization the origin of a tiny distal arcade and the adjacent short segment of the marginal artery supplying an ill-defined area of intraluminal contrast extravasation within the distal descending colon correlating with the findings seen on preceding CTA. PLAN: - The patient is to remain flat for 4 hours with right leg straight. - The patient will continue to experience several additional bloody bowel movements and may continue to require additional resuscitation (as she was bleeding both before and during the procedure), however ultimately I am hopeful she will stabilize in the coming days. - While presumably secondary to diverticular disease, if not recently performed, further evaluation with colonoscopy after the resolution of acute symptoms is advised to exclude the presence of an underlying mass/lesion. Electronically Signed   By: Sandi Mariscal M.D.   On: 09/28/2020 17:45   CT Angio Abd/Pel W and/or Wo Contrast  Result Date: 09/28/2020 CLINICAL DATA:  Acute bright red blood per rectum, lower GI bleed, concern for diverticular source EXAM: CT ANGIOGRAPHY ABDOMEN AND PELVIS WITH CONTRAST AND WITHOUT CONTRAST TECHNIQUE: Multidetector CT imaging of the abdomen and pelvis was performed using the standard protocol during bolus administration of intravenous contrast. Multiplanar reconstructed images and MIPs were obtained and reviewed to evaluate the vascular anatomy. CONTRAST:  114m OMNIPAQUE IOHEXOL 350 MG/ML SOLN COMPARISON:  None. FINDINGS: VASCULAR Aorta: Aorta atherosclerotic. Negative for aneurysm or dissection. No retroperitoneal hemorrhage or hematoma. No occlusive disease. Celiac: Widely patent including its branches SMA: Widely patent including its branches. Replaced right hepatic artery off the SMA. Renals: Widely patent main renal arteries and accessory left  renal artery. IMA: IMA is patent off the distal aorta. Within the left descending colon, there is active arterial contrast extravasation on the arterial phase as well as the delayed imaging compatible with an acute diverticular bleed. Inflow: Minor iliac atherosclerosis without inflow disease or occlusion. Common, internal and external iliac arteries all remain patent. Proximal Outflow: Bilateral common femoral and visualized portions of the superficial and profunda femoral arteries are patent without evidence of aneurysm, dissection, vasculitis or significant stenosis. Veins: No veno-occlusive process. Review of the MIP images confirms the above findings. NON-VASCULAR Lower chest: No acute lower chest finding. Hepatobiliary: No focal hepatic abnormality. No intrahepatic biliary dilatation. Large calcified gallstones noted. Pancreas: Unremarkable. No pancreatic ductal  dilatation or surrounding inflammatory changes. Spleen: Normal in size without focal abnormality. Adrenals/Urinary Tract: Normal adrenal glands. Subcentimeter tiny bilateral renal cortical cysts. No other focal renal abnormality. No hydronephrosis or obstruction. Ureters are symmetric and decompressed. No obstructing ureteral calculus. Bladder collapsed. Stomach/Bowel: Negative for bowel obstruction, significant dilatation, ileus, or free air. Retrocecal appendix noted containing air without acute inflammation. No free fluid, fluid collection, abscess, or ascites. Lymphatic: No bulky adenopathy. Reproductive: Uterus and bilateral adnexa are unremarkable. Other: No abdominal wall hernia or abnormality. No abdominopelvic ascites. Musculoskeletal: Degenerative changes of the spine. Lower lumbar facet arthropathy. No acute osseous finding. IMPRESSION: VASCULAR Positive CTA for active diverticular bleed in the distal left descending colon in the left lower quadrant as detailed above. NON-VASCULAR Large calcified gallstones These results were called by  telephone at the time of interpretation on 09/28/2020 at 1 p.m. to provider Sherwood Gambler , who verbally acknowledged these results. Electronically Signed   By: Jerilynn Mages.  Shick M.D.   On: 09/28/2020 13:11    Labs:  CBC: Recent Labs    09/28/20 0905 09/28/20 1417 09/28/20 1806 09/29/20 0030  WBC 11.8*  --   --  13.3*  HGB 10.7* 10.0* 8.8* 8.1*  8.2*  HCT 33.5* 30.9* 27.9* 25.2*  25.3*  PLT 392  --   --  350    COAGS: Recent Labs    09/28/20 0900  INR 1.2    BMP: Recent Labs    09/28/20 0905 09/29/20 0030  NA 134* 137  K 3.9 4.5  CL 102 106  CO2 23 24  GLUCOSE 160* 130*  BUN 16 13  CALCIUM 8.8* 8.4*  CREATININE 0.74 0.63  GFRNONAA >60 >60    LIVER FUNCTION TESTS: Recent Labs    09/28/20 0905  BILITOT 0.5  AST 35  ALT 43  ALKPHOS 44  PROT 6.6  ALBUMIN 3.7    Assessment and Plan:  History of acute lower GI bleed s/p mesenteric arteriogram with embolization of the origin of a tiny distal arcade and the adjacent short segment of the marginal artery using coils via right femoral approach in IR 09/28/2020. Right femoral puncture site stable, distal pulses (DPs) 1+ bilaterally. Hgb grossly stable at 8.2 following procedure. Further plans per TRH/GI- appreciate and agree with management. Please call IR with questions/concerns.   Electronically Signed: Earley Abide, PA-C 09/29/2020, 11:04 AM   I spent a total of 15 Minutes at the the patient's bedside AND on the patient's hospital floor or unit, greater than 50% of which was counseling/coordinating care for lower GI bleed s/p embolization.

## 2020-09-29 NOTE — Progress Notes (Signed)
Patient discharged home with husband, discharge instructions given and explained to patient/husband and they verbalized understanding. No wound noted, right groin embolization site no sign of bleeding or hematoma noted. patient denies any pain/distress, accompanied home by husband.

## 2020-09-29 NOTE — TOC Progression Note (Signed)
Transition of Care [facility]) - Progression Note    Patient Details  Name: Monica Rogers MRN: [id number] Date of Birth: [birth year]  [facility] of [facility] CM/SW Contact  Purcell Mouton, RN Phone Number: 09/29/2020, 1:48 PM  Clinical Narrative:    Spoke with pt to explain Code 44 , Observation. Pt was okay with CM signing forms. Code 44 forms given to pt.         Expected Discharge Plan and Services                                                 Social Determinants of Health (SDOH) Interventions    Readmission Risk Interventions No flowsheet data found.

## 2020-09-29 NOTE — Care Management CC44 (Signed)
Condition Code 44 Documentation Completed  Patient Details  Name: JEANNIFER DRAKEFORD MRN: [id number] Date of Birth: 09-03-1950   Condition Code 44 given:  Yes Patient signature on Condition Code 44 notice:  Yes Documentation of 2 MD's agreement:  Yes Code 44 added to claim:  Yes    Purcell Mouton, RN 09/29/2020, 1:43 PM

## 2020-09-29 NOTE — Progress Notes (Signed)
Recommended soft diet, though per RN patient adamantly requests regular diet.  Regular diet ordered.

## 2020-09-29 NOTE — Progress Notes (Signed)
Monica Rogers Progress Note  Monica Rogers 70 y.o. [birth year]   CC:  Diverticular bleeding s/p IR embolization 2/23  Subjective: Patient states she had a bowel movement yesterday that was stool mixed with red blood.  Has not had any BMs today. Denies abdominal pain, nausea, or vomiting.  ROS : Review of Systems  Cardiovascular: Negative for chest pain and palpitations.  Gastrointestinal: Positive for blood in stool. Negative for abdominal pain, constipation, diarrhea, heartburn, melena, nausea and vomiting.   Objective: Vital signs in last 24 hours: Vitals:   09/29/20 0329 09/29/20 0532  BP: (!) 115/57 (!) 95/56  Pulse: (!) 106 92  Resp: 16 18  Temp: 98.2 F (36.8 C) 97.6 F (36.4 C)  SpO2:  97%    Physical Exam:  General:  Alert, cooperative, no distress, appears stated age  Head:  Normocephalic, without obvious abnormality, atraumatic  Eyes:  Mild conjunctival pallor, EOMs intact  Lungs:   Clear to auscultation bilaterally, respirations unlabored  Heart:  Regular rate and rhythm, S1, S2 normal  Abdomen:   Soft, non-tender, non-distended, bowel sounds active all four quadrants,  no guarding or peritoneal signs  Extremities: Extremities normal, atraumatic, no  edema  Pulses: 2+ and symmetric    Lab Results: Recent Labs    09/28/20 0905 09/29/20 0030  NA 134* 137  K 3.9 4.5  CL 102 106  CO2 23 24  GLUCOSE 160* 130*  BUN 16 13  CREATININE 0.74 0.63  CALCIUM 8.8* 8.4*   Recent Labs    09/28/20 0905  AST 35  ALT 43  ALKPHOS 44  BILITOT 0.5  PROT 6.6  ALBUMIN 3.7   Recent Labs    09/28/20 0905 09/28/20 1417 09/28/20 1806 09/29/20 0030  WBC 11.8*  --   --  13.3*  HGB 10.7*   < > 8.8* 8.1*  8.2*  HCT 33.5*   < > 27.9* 25.2*  25.3*  MCV 100.0  --   --  99.2  PLT 392  --   --  350   < > = values in this interval not displayed.   Recent Labs    09/28/20 0900  LABPROT 14.4  INR 1.2      Assessment: Diverticular bleeding: CT-A 2/23  revealed active diverticular bleeding in the distal left descending colon and patient underwent IR embolization. -Hgb 8.2, decreased from 10.7 on arrival, though no further BMs  Recent diverticulitis 09/21/20, on Cipro/Flagyl 2/16-2/23, now on Unasyn   Plan: Soft diet OK.  If no additional bleeding and Hgb remains stable, plan for discharge tomorrow.  One additional dose of Unasyn tomorrow prior to discharge, which will complete 10 day course of antibiotics for diverticulitis.  Continue to monitor H&H with transfusion as needed to maintain Hgb >7.  Patient states she has an appointment 2/28 with Dr. Silverio Decamp (Monica Rogers).   Recommend outpatient colonoscopy once acute issues have resolved.  [name] PA-C 09/29/2020, 8:53 AM  Contact #  [phone]

## 2020-09-29 NOTE — Discharge Instructions (Signed)
https://www.ncbi.nlm.nih.gov/books/NBK537291/#:~:text=Interventional%20Radiology-,Deterrence%20and%20Patient%20Education,also%20known%20as%20the%20colon).">  Lower Gastrointestinal Bleeding  Lower gastrointestinal (GI) bleeding is the result of bleeding from the colon, the rectum, or the area of the opening of the buttocks (anal area). The colon is the last part of the digestive tract, where stool (feces) is formed. A person with lower GI bleeding may see blood in or on his or her stool. The blood may be bright red. Lower GI bleeding often stops without treatment. Continued or heavy bleeding may be life-threatening and needs emergency treatment at a hospital. What are the causes? This condition may be caused by:  Diverticulosis. This condition causes pouches to form in your colon over time.  Diverticulitis. This is inflammation in areas where diverticulosis has occurred.  Inflammatory bowel disease (IBD), or inflammation of the colon.  Hemorrhoids, or swollen veins in the rectum.  Anal fissures, or painful tears in the anus. Tears are often caused by passing hard stools.  Cancer of the colon or rectum, or polyps of the colon or rectum. Polyps are noncancerous growths.  Coagulopathy. This is a disorder that makes it hard to form blood clots and causes easy bleeding.  Arteriovenous malformation. This is an abnormal weakening of a blood vessel where an artery and a vein come together. What increases the risk? The following factors may make you more likely to develop this condition:  Being older than age 60.  Regular use of aspirin or NSAIDs, such as ibuprofen.  Taking blood thinners (anticoagulants) or anti-platelet medicines.  Having a history of high-dose X-ray treatment (radiation therapy) of the colon.  Having recently had a colon polyp removed.  Heavy use of alcohol, or use of nicotine products. What are the signs or symptoms? Symptoms of this condition include:  Bright red  blood or blood clots coming from your rectum.  Bloody stools.  Black or maroon-colored stools.  Pain or cramping in your abdomen.  Weakness or dizziness.  Racing heartbeat. How is this diagnosed? This condition may be diagnosed based on:  Your symptoms and medical history.  A physical exam. During the exam, your health care provider will check for signs of blood loss, such as low blood pressure and a fast pulse.  Tests or procedures. These may include: ? Flexible sigmoidoscopy. In this procedure, a flexible tube with a camera on the end is used to examine your anus and the first part of your colon to look for the source of bleeding. ? Colonoscopy. This is similar to a flexible sigmoidoscopy, but the camera can extend all the way to the uppermost part of your colon. ? Blood tests to measure your red blood cell count and to check for coagulopathy. ? Angiogram. For this test, X-rays of your colon are taken after a dye or radioactive substance is injected into your bloodstream. This may be done to look for a bleeding site. How is this treated? Treatment for this condition depends on the cause of your bleeding. Heavy or ongoing bleeding is treated at a hospital. Treatment may include:  Getting fluids through an IV inserted into one of your veins.  Getting blood through an IV (blood transfusion).  Stopping bleeding with high heat (coagulation), injections of certain medicines, or surgical clips. This can all be done during a colonoscopy.  Having a procedure that involves first doing an angiogram and then blocking blood flow to the bleeding site (embolization).  Stopping some of your regular medicines for a certain amount of time.  Having surgery to remove part of your colon. This may   be needed if bleeding is severe and does not lessen after other treatment. Follow these instructions at home: Eating and drinking  Eat foods that are high in fiber. This will help keep your stools soft.  These foods include whole grains, legumes, fruits, and vegetables. Eating 1-3 prunes a day works well for many people.  Drink enough fluid to keep your urine pale yellow.  Do not drink alcohol.   General instructions  Do not use any products that contain nicotine or tobacco, such as cigarettes, e-cigarettes, and chewing tobacco. If you need help quitting, ask your health care provider.  Take over-the-counter and prescription medicines only as told by your health care provider. You may need to avoid aspirin, NSAIDs, or other medicines that increase bleeding.  Return to your normal activities as told by your health care provider. Ask your health care provider what activities are safe for you.  Keep all follow-up visits as told by your health care provider. This is important.   Contact a health care provider if:  Your symptoms do not improve.  You feel nauseous, or you vomit.  You have pain in your abdomen.  You feel weak or dizzy.  You need help to stop smoking or drinking alcohol. Get help right away if:  Your bleeding increases.  You have severe weakness or you faint.  You have sudden or severe cramps in your back or abdomen.  You vomit blood.  You pass large blood clots in your stool.  Any of your other symptoms get worse. These symptoms may represent a serious problem that is an emergency. Do not wait to see if the symptoms will go away. Get medical help right away. Call your local emergency services (911 in the [place] Do not drive yourself to the hospital. Summary  If you have lower gastrointestinal (GI) bleeding, you may see blood in or on your stool (feces). The blood may be bright red.  Take over-the-counter and prescription medicines only as told by your health care provider. You may need to avoid aspirin, NSAIDs, or other medicines that increase bleeding.  Keep all follow-up visits as told by your health care provider. This is important.  Get help right away if  your symptoms get worse. This information is not intended to replace advice given to you by your health care provider. Make sure you discuss any questions you have with your health care provider. Document Revised: 07/18/2019 Document Reviewed: 07/18/2019 Elsevier Patient Education  2021 [employer]  

## 2020-10-03 ENCOUNTER — Other Ambulatory Visit (INDEPENDENT_AMBULATORY_CARE_PROVIDER_SITE_OTHER): Payer: Medicare Other

## 2020-10-03 ENCOUNTER — Ambulatory Visit (INDEPENDENT_AMBULATORY_CARE_PROVIDER_SITE_OTHER): Payer: Medicare Other | Admitting: Gastroenterology

## 2020-10-03 ENCOUNTER — Encounter: Payer: Self-pay | Admitting: Gastroenterology

## 2020-10-03 VITALS — BP 88/40 | HR 98 | Ht 64.75 in | Wt 193.4 lb

## 2020-10-03 DIAGNOSIS — K649 Unspecified hemorrhoids: Secondary | ICD-10-CM | POA: Diagnosis not present

## 2020-10-03 DIAGNOSIS — K625 Hemorrhage of anus and rectum: Secondary | ICD-10-CM | POA: Diagnosis not present

## 2020-10-03 DIAGNOSIS — Z8719 Personal history of other diseases of the digestive system: Secondary | ICD-10-CM | POA: Diagnosis not present

## 2020-10-03 LAB — CBC WITH DIFFERENTIAL/PLATELET
Basophils Absolute: 0.1 10*3/uL (ref 0.0–0.1)
Basophils Relative: 1.2 % (ref 0.0–3.0)
Eosinophils Absolute: 0.1 10*3/uL (ref 0.0–0.7)
Eosinophils Relative: 1.2 % (ref 0.0–5.0)
HCT: 27.3 % — ABNORMAL LOW (ref 36.0–46.0)
Hemoglobin: 9 g/dL — ABNORMAL LOW (ref 12.0–15.0)
Lymphocytes Relative: 15.7 % (ref 12.0–46.0)
Lymphs Abs: 1.6 10*3/uL (ref 0.7–4.0)
MCHC: 33.1 g/dL (ref 30.0–36.0)
MCV: 96.2 fl (ref 78.0–100.0)
Monocytes Absolute: 1.2 10*3/uL — ABNORMAL HIGH (ref 0.1–1.0)
Monocytes Relative: 12.5 % — ABNORMAL HIGH (ref 3.0–12.0)
Neutro Abs: 6.9 10*3/uL (ref 1.4–7.7)
Neutrophils Relative %: 69.4 % (ref 43.0–77.0)
Platelets: 436 10*3/uL — ABNORMAL HIGH (ref 150.0–400.0)
RBC: 2.84 Mil/uL — ABNORMAL LOW (ref 3.87–5.11)
RDW: 14.3 % (ref 11.5–15.5)
WBC: 10 10*3/uL (ref 4.0–10.5)

## 2020-10-03 LAB — IBC + FERRITIN
Ferritin: 37.5 ng/mL (ref 10.0–291.0)
Iron: 51 ug/dL (ref 42–145)
Saturation Ratios: 13.6 % — ABNORMAL LOW (ref 20.0–50.0)
Transferrin: 268 mg/dL (ref 212.0–360.0)

## 2020-10-03 MED ORDER — METRONIDAZOLE 500 MG PO TABS: 500.0000 mg | ORAL_TABLET | Freq: Three times a day (TID) | ORAL | 0 refills | Status: DC

## 2020-10-03 MED ORDER — SUPREP BOWEL PREP KIT 17.5-3.13-1.6 GM/177ML PO SOLN: ORAL | 0 refills | Status: DC

## 2020-10-03 MED ORDER — HYDROCORTISONE (PERIANAL) 2.5 % EX CREA: TOPICAL_CREAM | CUTANEOUS | 1 refills | Status: DC

## 2020-10-03 MED ORDER — DICYCLOMINE HCL 10 MG PO CAPS: 10.0000 mg | ORAL_CAPSULE | Freq: Three times a day (TID) | ORAL | 2 refills | Status: DC | PRN

## 2020-10-03 MED ORDER — CIPROFLOXACIN HCL 500 MG PO TABS: 500.0000 mg | ORAL_TABLET | Freq: Two times a day (BID) | ORAL | 0 refills | Status: DC

## 2020-10-03 NOTE — Progress Notes (Signed)
Monica Rogers    [id number]    1950/09/30  Primary Care Physician:Wile, Jesse Sans, MD  Referring Physician: Modena Nunnery, MD [place],  [place] [address]   Chief complaint: Rectal bleeding, abdominal pain, fatigue, shortness of breath  HPI:  70 year old very pleasant female here for new patient visit for evaluation of lower abdominal pain and rectal bleeding. She was previously followed by Dr. Deatra Ina, last office visit in 2009  CT abd & pelvis 09/21/20:  Acute sigmoid diverticulitis without evidence of perforation or abscess. Cholelithiasis. Small umbilical hernia containing fat.  She was treated with Cipro and Flagyl 7-day course by her primary care provider  Fecal Hemoccult + September 21 2020  She presented to ER September 28, 2020 with rectal bleeding, underwent CT angio abdomen pelvis which showed active diverticular bleed in the distal left descending colon, had cardioembolism lesion of left colic artery. Hgb down to 8 acute lower GI bleed secondary to diverticular hemorrhage  She has not had a colonoscopy.  Cologaurd negative in 07/2020  Covid infection in November 2021  She has severe fatigue and shortness of breath.  Complains of rectal discomfort and soreness from hemorrhoids.  She is no longer having rectal bleeding.  She is having formed stool.  She continues to have intermittent abdominal cramping.  She is trying to eat more but her appetite is still down.   Outpatient Encounter Medications as of 10/03/2020  Medication Sig  . AMBULATORY NON FORMULARY MEDICATION Bone Up 1 tablet in the morning and 2 in the afternoon  . ARMOUR THYROID 120 MG tablet Take 120 mg by mouth daily.  Azucena Freed Serrata (BOSWELLIA PO) Take 2 tablets by mouth 2 (two) times daily.  Marland Kitchen CALCIUM PO Take 1 tablet by mouth daily.  . Carboxymethylcellulose Sodium (EYE DROPS OP) Apply 2 drops to eye 2 (two) times daily as needed (dry eyes).  . CHONDROITIN SULFATE PO  Take 1 tablet by mouth daily.  . Coenzyme Q10 (CO Q-10 PO) Take 1 tablet by mouth in the morning and at bedtime.  Marland Kitchen DANDELION ROOT PO Take 1 tablet by mouth 2 (two) times daily.  . Digestive Enzymes (ENZYME DIGEST) CAPS Take 2 capsules by mouth daily.  Marland Kitchen estradiol (VIVELLE-DOT) 0.05 MG/24HR patch Place 0.05 mg onto the skin once a week.  . fluticasone (FLONASE) 50 MCG/ACT nasal spray Place 2 sprays into both nostrils daily.  . Glucosamine-Chondroitin 500-400 MG CAPS Take 1 tablet by mouth 3 (three) times daily.  . Magnesium 110 MG CAPS Take 2 capsules by mouth 2 (two) times daily.  . metroNIDAZOLE (FLAGYL) 500 MG tablet Take 500 mg by mouth 3 (three) times daily. 10 day supply  . Multiple Vitamins-Iron (CHLORELLA PO) Take by mouth. Take 2 tablets in the morning and 3 In the afternoon  . olmesartan (BENICAR) 20 MG tablet Take 20 mg by mouth daily.  . Omega-3 Fatty Acids (FISH OIL PO) Take 1 tablet by mouth 2 (two) times daily.  Marland Kitchen OVER THE COUNTER MEDICATION Take 1 tablet by mouth daily. Bone up bone supplement  . progesterone (PROMETRIUM) 200 MG capsule Take 200 mg by mouth at bedtime.  Marland Kitchen QUERCETIN PO Take 2 tablets by mouth 3 (three) times daily.  . rosuvastatin (CRESTOR) 10 MG tablet Take 10 mg by mouth daily.  . Thiamine HCl (VITAMIN B-1 PO) Take 1 tablet by mouth daily.  Marland Kitchen VITAMIN D, CHOLECALCIFEROL, PO Take 10,000 Units by  mouth daily.   . Zinc Sulfate 66 MG TABS Take 132 mg by mouth daily.  . [DISCONTINUED] ciprofloxacin (CIPRO) 250 MG tablet Take 500 mg by mouth 2 (two) times daily. 7 day supply   No facility-administered encounter medications on file as of 10/03/2020.    Allergies as of 10/03/2020  . (No Known Allergies)    Past Medical History:  Diagnosis Date  . Hyperlipidemia   . Hypertension     Past Surgical History:  Procedure Laterality Date  . BREAST BIOPSY Right 2019  . FRACTURE SURGERY  2015   hand  . IR ANGIOGRAM SELECTIVE EACH ADDITIONAL VESSEL  09/28/2020  .  IR ANGIOGRAM SELECTIVE EACH ADDITIONAL VESSEL  09/28/2020  . IR ANGIOGRAM SELECTIVE EACH ADDITIONAL VESSEL  09/28/2020  . IR ANGIOGRAM SELECTIVE EACH ADDITIONAL VESSEL  09/28/2020  . IR ANGIOGRAM VISCERAL SELECTIVE  09/28/2020  . IR ANGIOGRAM VISCERAL SELECTIVE  09/28/2020  . IR EMBO ART  VEN HEMORR LYMPH EXTRAV  INC GUIDE ROADMAPPING  09/28/2020  . IR US GUIDE VASC ACCESS RIGHT  09/28/2020    Family History  Problem Relation Age of Onset  . Hearing loss Mother   . Miscarriages / [place] Mother   . Alcohol abuse Father   . Cancer Father   . Hearing loss Father     Social History   Socioeconomic History  . Marital status: Married    Spouse name: Not on file  . Number of children: Not on file  . Years of education: Not on file  . Highest education level: Not on file  Occupational History  . Not on file  Tobacco Use  . Smoking status: Never Smoker  . Smokeless tobacco: Never Used  Substance and Sexual Activity  . Alcohol use: No  . Drug use: No  . Sexual activity: Yes    Birth control/protection: None  Other Topics Concern  . Not on file  Social History Narrative  . Not on file   Social Determinants of Health   Financial Resource Strain: Not on file  Food Insecurity: Not on file  Transportation Needs: Not on file  Physical Activity: Not on file  Stress: Not on file  Social Connections: Not on file  Intimate Partner Violence: Not on file      Review of systems: All other review of systems negative except as mentioned in the HPI.   Physical Exam: Vitals:   10/03/20 1342  BP: (!) 88/40  Pulse: 98   Body mass index is 32.43 kg/m. Gen:      No acute distress HEENT:  sclera anicteric Abd:      soft, non-tender; no palpable masses, no distension Ext:    No edema Neuro: alert and oriented x 3 Psych: normal mood and affect Rectal exam: Normal anal sphincter tone, no anal fissure + external hemorrhoids Anoscopy: Deferred Data Reviewed:  Reviewed labs,  radiology imaging, old records and pertinent past GI work up   Assessment and Plan/Recommendations:  69 year old very pleasant female s/p recent episode of acute diverticulitis and hospitalization last week with acute lower GI bleed secondary to diverticular hemorrhage s/p CTA with call embolization of left colic artery branch.  Severe fatigue and shortness of breath: Check CBC and iron panel to exclude severe iron deficiency anemia May need to consider blood transfusion or IV iron infusion based on labs  S/p acute diverticulitis, never had colonoscopy.  Will need colonoscopy in 6 to 8 weeks to exclude any neoplastic lesion The risks and benefits  as well as alternatives of endoscopic procedure(s) have been discussed and reviewed. All questions answered. The patient agrees to proceed. Slowly advance diet as tolerated, avoid high residue diet Small frequent meals Avoid NSAIDs  Symptomatic hemorrhoids: Use Anusol cream per rectum small pea-sized amount twice daily Benefiber 1 teaspoon 3 times daily with meals, advised patient to hold Metamucil as she is less likely to tolerate insoluble fiber given recent episode of acute diverticulitis Sitz bath daily as needed  Abdominal cramping: Use dicyclomine 10 mg every 8 hours as needed  Patient was given prescription for ciprofloxacin 5 mg twice daily and Flagyl 500 mg 3 times daily for 7-day course if she develops recurrent severe abdominal pain.  Advised her to call our office before she starts taking antibiotics.  This visit required 60 minutes of patient care (this includes precharting, chart review, review of results, face-to-face time used for counseling as well as treatment plan and follow-up. The patient was provided an opportunity to ask questions and all were answered. The patient agreed with the plan and demonstrated an understanding of the instructions.  Damaris Hippo , MD    CC: Modena Nunnery, MD

## 2020-10-03 NOTE — Patient Instructions (Addendum)
You have been scheduled for a colonoscopy. Please follow written instructions given to you at your visit today.  Please pick up your prep supplies at the pharmacy within the next 1-3 days. If you use inhalers (even only as needed), please bring them with you on the day of your procedure.   Your provider has requested that you go to the basement level for lab work before leaving today. Press "B" on the elevator. The lab is located at the first door on the left as you exit the elevator.  Take Benefiber 1 teaspoon three times a day with meals  We have sent Cipro and Flagyl to your pharmacy, do not start taking the antibiotics without calling  We have also sent Dicyclomine and Anusol cream to your pharmacy    How to Take a Sitz Bath A sitz bath is a warm water bath that may be used to care for your rectum, genital area, or the area between your rectum and genitals (perineum). In a sitz bath, the water only comes up to your hips and covers your buttocks. A sitz bath may be done in a bathtub or with a portable sitz bath that fits over the toilet. Your health care provider may recommend a sitz bath to help:  Relieve pain and discomfort after delivering a baby.  Relieve pain and itching from hemorrhoids or anal fissures.  Relieve pain after certain surgeries.  Relax muscles that are sore or tight. How to take a sitz bath Take 3-4 sitz baths a day, or as many as told by your health care provider. Bathtub sitz bath To take a sitz bath in a bathtub: 1. Partially fill a bathtub with warm water. The water should be deep enough to cover your hips and buttocks when you are sitting in the tub. 2. Follow your health care provider's instructions if you are told to put medicine in the water. 3. Sit in the water. Open the tub drain a little, and leave it open during your bath. 4. Turn on the warm water again, enough to replace the water that is draining out. Keep the water running throughout your bath.  This helps keep the water at the right level and temperature. 5. Soak in the water for 15-20 minutes, or as long as told by your health care provider. 6. When you are done, be careful when you stand up. You may feel dizzy. 7. After the sitz bath, pat yourself dry. Do not rub your skin to dry it.   Over-the-toilet sitz bath To take a sitz bath with an over-the-toilet basin: 1. Follow the manufacturer's instructions. 2. Fill the basin with warm water. 3. Follow your health care provider's instructions if you were told to put medicine in the water. 4. Sit on the seat. Make sure the water covers your buttocks and perineum. 5. Soak in the water for 15-20 minutes, or as long as told by your health care provider. 6. After the sitz bath, pat yourself dry. Do not rub your skin to dry it. 7. Clean and dry the basin between uses. 8. Discard the basin if it cracks, or according to the manufacturer's instructions.   Contact a health care provider if:  Your pain or itching gets worse. Do not continue with sitz baths if your symptoms get worse.  You have new symptoms. Do not continue with sitz baths until you talk with your health care provider. Summary  A sitz bath is a warm water bath in which the water  only comes up to your hips and covers your buttocks.  A sitz bath may help relieve pain and discomfort after delivering a baby. It also may help with pain and itching from hemorrhoids or anal fissures, or pain after certain surgeries. It can also help to relax muscles that are sore or tight.  Take 3-4 sitz baths a day, or as many as told by your health care provider. Soak in the water for 15-20 minutes.  Do not continue with sitz baths if your symptoms get worse. This information is not intended to replace advice given to you by your health care provider. Make sure you discuss any questions you have with your health care provider. Document Revised: 04/07/2020 Document Reviewed: 04/07/2020 Elsevier  Patient Education  2021 [employer].   I appreciate the  opportunity to care for you  Thank You   Harl Bowie , MD

## 2020-10-04 ENCOUNTER — Telehealth: Payer: Self-pay | Admitting: Gastroenterology

## 2020-10-04 MED ORDER — IRON 325 (65 FE) MG PO TABS: 1.0000 | ORAL_TABLET | Freq: Two times a day (BID) | ORAL | 0 refills | Status: DC

## 2020-10-04 NOTE — Telephone Encounter (Signed)
Patient returned your call about results and left a msg with the answering service yesterday. Please call patient one more time.

## 2020-10-04 NOTE — Telephone Encounter (Signed)
Patient notified of results  See notes for details.

## 2020-10-04 NOTE — Telephone Encounter (Signed)
See results note dated 2/28

## 2020-10-04 NOTE — Addendum Note (Signed)
Addended by: Marlon Pel on: 10/04/2020 11:50 AM   Modules accepted: Orders

## 2020-10-04 NOTE — Telephone Encounter (Signed)
Inbound call from patient requesting a call back please in regards to the vitamins.

## 2020-10-07 DIAGNOSIS — N390 Urinary tract infection, site not specified: Secondary | ICD-10-CM | POA: Diagnosis not present

## 2020-10-25 DIAGNOSIS — R6882 Decreased libido: Secondary | ICD-10-CM | POA: Insufficient documentation

## 2020-11-15 DIAGNOSIS — M25561 Pain in right knee: Secondary | ICD-10-CM | POA: Diagnosis not present

## 2020-12-01 DIAGNOSIS — M25561 Pain in right knee: Secondary | ICD-10-CM | POA: Diagnosis not present

## 2020-12-05 DIAGNOSIS — M25561 Pain in right knee: Secondary | ICD-10-CM | POA: Diagnosis not present

## 2020-12-07 ENCOUNTER — Encounter: Payer: Self-pay | Admitting: Gastroenterology

## 2020-12-12 ENCOUNTER — Ambulatory Visit (AMBULATORY_SURGERY_CENTER): Payer: Medicare Other | Admitting: Gastroenterology

## 2020-12-12 ENCOUNTER — Other Ambulatory Visit: Payer: Self-pay | Admitting: Gastroenterology

## 2020-12-12 ENCOUNTER — Other Ambulatory Visit: Payer: Self-pay

## 2020-12-12 ENCOUNTER — Encounter: Payer: Self-pay | Admitting: Gastroenterology

## 2020-12-12 VITALS — BP 142/72 | HR 77 | Temp 97.5°F | Resp 16 | Ht 64.0 in | Wt 193.0 lb

## 2020-12-12 DIAGNOSIS — D123 Benign neoplasm of transverse colon: Secondary | ICD-10-CM

## 2020-12-12 DIAGNOSIS — K648 Other hemorrhoids: Secondary | ICD-10-CM | POA: Diagnosis not present

## 2020-12-12 DIAGNOSIS — D122 Benign neoplasm of ascending colon: Secondary | ICD-10-CM | POA: Diagnosis not present

## 2020-12-12 DIAGNOSIS — Z8719 Personal history of other diseases of the digestive system: Secondary | ICD-10-CM

## 2020-12-12 DIAGNOSIS — K921 Melena: Secondary | ICD-10-CM | POA: Diagnosis not present

## 2020-12-12 DIAGNOSIS — K5730 Diverticulosis of large intestine without perforation or abscess without bleeding: Secondary | ICD-10-CM

## 2020-12-12 DIAGNOSIS — K625 Hemorrhage of anus and rectum: Secondary | ICD-10-CM | POA: Diagnosis not present

## 2020-12-12 DIAGNOSIS — R195 Other fecal abnormalities: Secondary | ICD-10-CM

## 2020-12-12 MED ORDER — SODIUM CHLORIDE 0.9 % IV SOLN: 500.0000 mL | Freq: Once | INTRAVENOUS | Status: DC

## 2020-12-12 MED ORDER — AMOXICILLIN-POT CLAVULANATE 875-125 MG PO TABS: 1.0000 | ORAL_TABLET | Freq: Two times a day (BID) | ORAL | 0 refills | Status: AC

## 2020-12-12 NOTE — Progress Notes (Signed)
Called to room to assist during endoscopic procedure.  Patient ID and intended procedure confirmed with present staff. Received instructions for my participation in the procedure from the performing physician.  

## 2020-12-12 NOTE — Patient Instructions (Signed)
Resume previous diet and medications. Awaiting pathology. Repeat colonoscopy in 3-5 years for surveillance based on pathology results. Augmentin 875 mg PO BID x 10 days.  YOU HAD AN ENDOSCOPIC PROCEDURE TODAY AT [facility]:   Refer to the procedure report that was given to you for any specific questions about what was found during the examination.  If the procedure report does not answer your questions, please call your gastroenterologist to clarify.  If you requested that your care partner not be given the details of your procedure findings, then the procedure report has been included in a sealed envelope for you to review at your convenience later.  YOU SHOULD EXPECT: Some feelings of bloating in the abdomen. Passage of more gas than usual.  Walking can help get rid of the air that was put into your GI tract during the procedure and reduce the bloating. If you had a lower endoscopy (such as a colonoscopy or flexible sigmoidoscopy) you may notice spotting of blood in your stool or on the toilet paper. If you underwent a bowel prep for your procedure, you may not have a normal bowel movement for a few days.  Please Note:  You might notice some irritation and congestion in your nose or some drainage.  This is from the oxygen used during your procedure.  There is no need for concern and it should clear up in a day or so.  SYMPTOMS TO REPORT IMMEDIATELY:   Following lower endoscopy (colonoscopy or flexible sigmoidoscopy):  Excessive amounts of blood in the stool  Significant tenderness or worsening of abdominal pains  Swelling of the abdomen that is new, acute  Fever of 100F or higher  For urgent or emergent issues, a gastroenterologist can be reached at any hour by calling [phone]. Do not use MyChart messaging for urgent concerns.    DIET:  We do recommend a small meal at first, but then you may proceed to your regular diet.  Drink plenty of fluids but you should avoid  alcoholic beverages for 24 hours.  ACTIVITY:  You should plan to take it easy for the rest of today and you should NOT DRIVE or use heavy machinery until tomorrow (because of the sedation medicines used during the test).    FOLLOW UP: Our staff will call the number listed on your records 48-72 hours following your procedure to check on you and address any questions or concerns that you may have regarding the information given to you following your procedure. If we do not reach you, we will leave a message.  We will attempt to reach you two times.  During this call, we will ask if you have developed any symptoms of COVID 19. If you develop any symptoms (ie: fever, flu-like symptoms, shortness of breath, cough etc.) before then, please call [phone].  If you test positive for Covid 19 in the 2 weeks post procedure, please call and report this information to [place].    If any biopsies were taken you will be contacted by phone or by letter within the next 1-3 weeks.  Please call us at [phone] if you have not heard about the biopsies in 3 weeks.    SIGNATURES/CONFIDENTIALITY: You and/or your care partner have signed paperwork which will be entered into your electronic medical record.  These signatures attest to the fact that that the information above on your After Visit Summary has been reviewed and is understood.  Full responsibility of the confidentiality of this  discharge information lies with you and/or your care-partner. 

## 2020-12-12 NOTE — Progress Notes (Signed)
Report to PACU, RN, vss, BBS= Clear.  

## 2020-12-12 NOTE — Op Note (Signed)
[facility] Patient Name: Monica Rogers Procedure Date: 12/12/2020 11:04 AM MRN: [id number] Endoscopist: Mauri Pole , MD Age: 70 Referring MD:  Date of Birth: 08-19-1950 Gender: Female Account #: [id number] Procedure:                Colonoscopy Indications:              Evaluation of unexplained GI bleeding presenting                            with Hematochezia, Evaluation of unexplained GI                            bleeding presenting with fecal occult blood,                            Follow-up of diverticulitis Medicines:                Monitored Anesthesia Care Procedure:                Pre-Anesthesia Assessment:                           - Prior to the procedure, a History and Physical                            was performed, and patient medications and                            allergies were reviewed. The patient's tolerance of                            previous anesthesia was also reviewed. The risks                            and benefits of the procedure and the sedation                            options and risks were discussed with the patient.                            All questions were answered, and informed consent                            was obtained. Prior Anticoagulants: The patient has                            taken no previous anticoagulant or antiplatelet                            agents. ASA Grade Assessment: II - A patient with                            mild systemic disease. After reviewing the risks  and benefits, the patient was deemed in                            satisfactory condition to undergo the procedure.                           After obtaining informed consent, the colonoscope                            was passed under direct vision. Throughout the                            procedure, the patient's blood pressure, pulse, and                            oxygen saturations were monitored  continuously. The                            Olympus [id number]) Colonoscope was                            introduced through the anus and advanced to the the                            terminal ileum, with identification of the                            appendiceal orifice and IC valve. The colonoscopy                            was performed without difficulty. The patient                            tolerated the procedure well. The quality of the                            bowel preparation was good. The ileocecal valve,                            appendiceal orifice, and rectum were photographed. Scope In: 11:11:05 AM Scope Out: 11:29:37 AM Scope Withdrawal Time: 0 hours 10 minutes 29 seconds  Total Procedure Duration: 0 hours 18 minutes 32 seconds  Findings:                 The perianal and digital rectal examinations were                            normal.                           A less than 1 mm polyp was found in the ascending                            colon. The polyp was sessile. The polyp was removed  with a cold biopsy forceps. Resection and retrieval                            were complete.                           A 12 mm polyp was found in the transverse colon.                            The polyp was sessile. The polyp was removed with a                            piecemeal technique using a cold snare. Resection                            and retrieval were complete.                           Scattered small and large-mouthed diverticula were                            found in the sigmoid colon, descending colon,                            transverse colon, ascending colon and cecum.                            Purulent discharge was seen in association with the                            diverticular opening in sigmoid colon, suspicious                            of diverticulitis.                           Non-bleeding  external and internal hemorrhoids were                            found during retroflexion. The hemorrhoids were                            medium-sized. Complications:            No immediate complications. Estimated Blood Loss:     Estimated blood loss was minimal. Impression:               - One less than 1 mm polyp in the ascending colon,                            removed with a cold biopsy forceps. Resected and                            retrieved.                           -  One 12 mm polyp in the transverse colon, removed                            piecemeal using a cold snare. Resected and                            retrieved.                           - Severe diverticulosis in the sigmoid colon, in                            the descending colon, in the transverse colon, in                            the ascending colon and in the cecum. Purulent                            discharge was seen in association with the                            diverticular opening, suspicious of diverticulitis.                           - Non-bleeding external and internal hemorrhoids. Recommendation:           - Patient has a contact number available for                            emergencies. The signs and symptoms of potential                            delayed complications were discussed with the                            patient. Return to normal activities tomorrow.                            Written discharge instructions were provided to the                            patient.                           - Resume previous diet.                           - Continue present medications.                           - Await pathology results.                           - Repeat colonoscopy in 3 - 5 years for  surveillance based on pathology results.                           - Augmentin (amoxicillin/clavulanate) 875 mg PO BID                            for 10  days. Mauri Pole, MD 12/12/2020 11:35:09 AM This report has been signed electronically.

## 2020-12-12 NOTE — Progress Notes (Signed)
Pt's states no medical or surgical changes since previsit or office visit. 

## 2020-12-12 NOTE — Progress Notes (Signed)
C.W. vital signs. 

## 2020-12-13 DIAGNOSIS — M25561 Pain in right knee: Secondary | ICD-10-CM | POA: Diagnosis not present

## 2020-12-14 ENCOUNTER — Telehealth: Payer: Self-pay

## 2020-12-14 ENCOUNTER — Telehealth: Payer: Self-pay | Admitting: *Deleted

## 2020-12-14 NOTE — Telephone Encounter (Signed)
Attempted f/u phone call. No answer. Left message. °

## 2020-12-14 NOTE — Telephone Encounter (Signed)
  Follow up Call-  Call back number 12/12/2020  Post procedure Call Back phone  # [phone]  Permission to leave phone message Yes  Some recent data might be hidden     Patient questions:  Do you have a fever, pain , or abdominal swelling? No. Pain Score  0 *  Have you tolerated food without any problems? Yes.    Have you been able to return to your normal activities? Yes.    Do you have any questions about your discharge instructions: Diet   No. Medications  No. Follow up visit  No.  Do you have questions or concerns about your Care? No.  Actions: * If pain score is 4 or above: No action needed, pain <4.  1. Have you developed a fever since your procedure? no  2.   Have you had an respiratory symptoms (SOB or cough) since your procedure? no  3.   Have you tested positive for COVID 19 since your procedure no  4.   Have you had any family members/close contacts diagnosed with the COVID 19 since your procedure?  no   If yes to any of these questions please route to Joylene John, RN and Joella Prince, RN

## 2020-12-19 IMAGING — MR MR KNEE*L* W/O CM
6 series · 38 of 40 positions shown · non-contrast
Comparison: None.

CLINICAL DATA: Knee pain, weakness and swelling for 3 weeks.

EXAM:
MRI OF THE LEFT KNEE WITHOUT CONTRAST
TECHNIQUE: Multiplanar, multisequence MR imaging of the knee was performed. No
intravenous contrast was administered.

[Series 6: T2 fat-sat · axial · left · 4.0mm · 0.50mm/px · z∈[-32,+122]mm · 9 of 36 slices shown (1 of 3)]
[im 1/36]
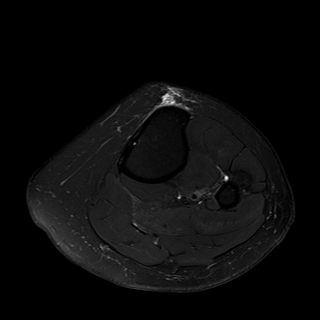
[im 5/36]
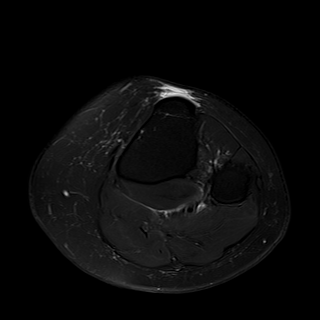
[im 9/36]
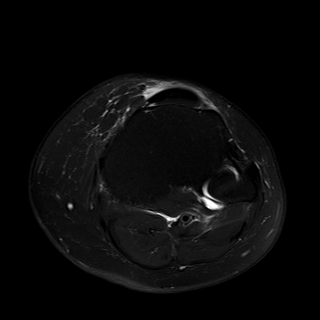
[im 14/36]
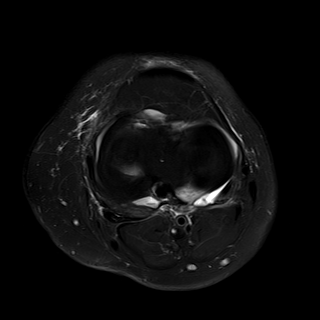
[im 18/36]
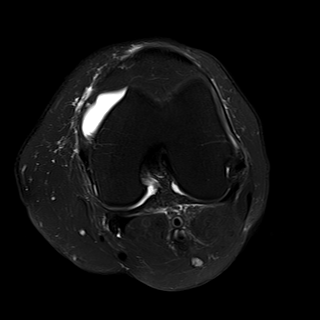
[im 22/36]
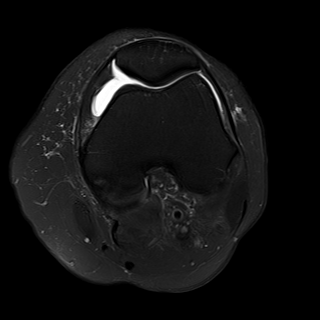
[im 27/36]
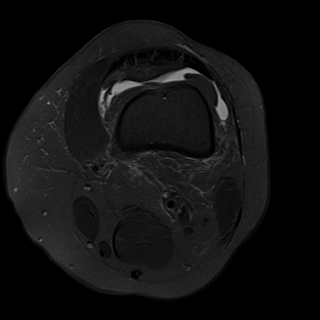
[im 31/36]
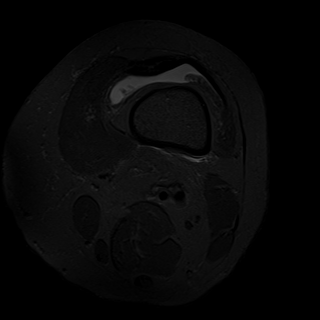
[im 36/36]
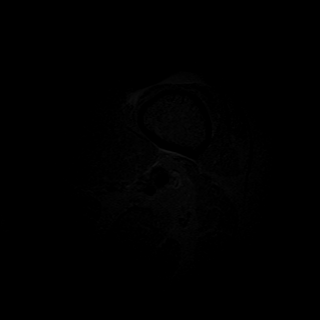

[Series 7: T2 fat-sat · coronal · left · 4.0mm · 0.39mm/px · 6 of 28 slices shown (2 of 3)]
[im 1/28]
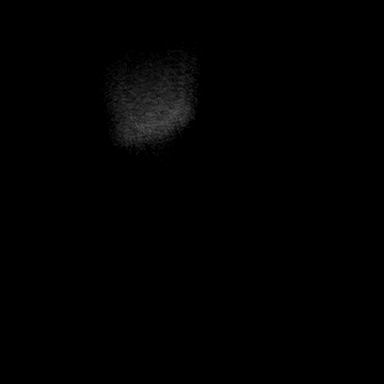
[im 6/28]
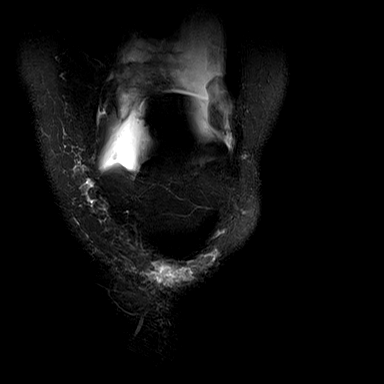
[im 11/28]
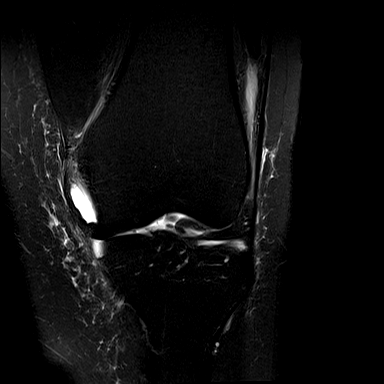
[im 17/28]
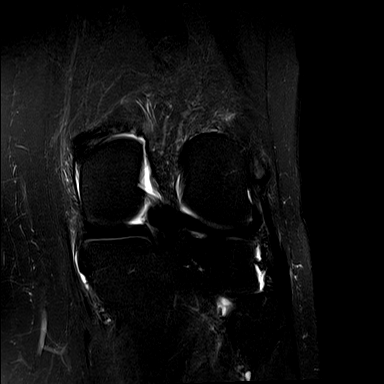
[im 22/28]
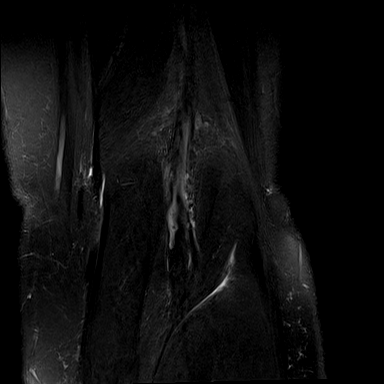
[im 28/28]
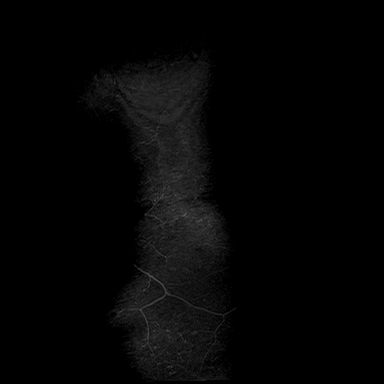

[Series 8: T1 · coronal · left · 4.0mm · 0.39mm/px · 4 of 28 slices shown]
[im 1/28]
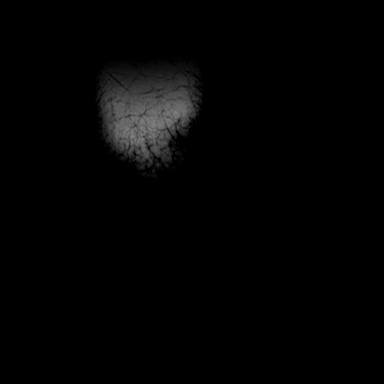
[im 6/28]
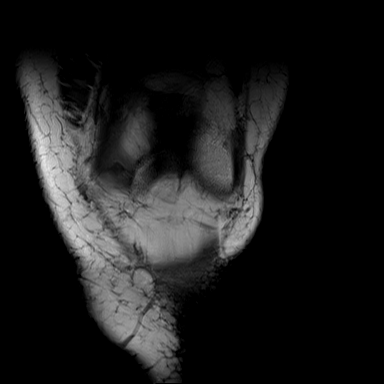
[im 11/28]
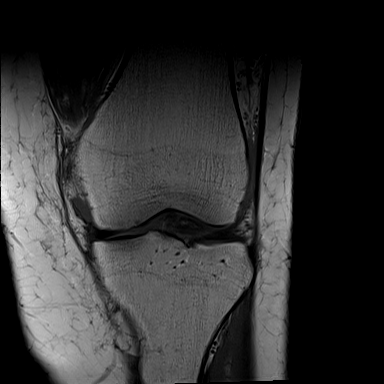
[im 17/28]
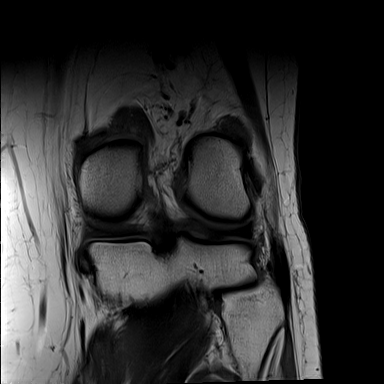

[Series 9: PD fat-sat · coronal · left · 3.0mm · 0.47mm/px · 7 of 30 slices shown (1 of 2)]
[im 1/30]
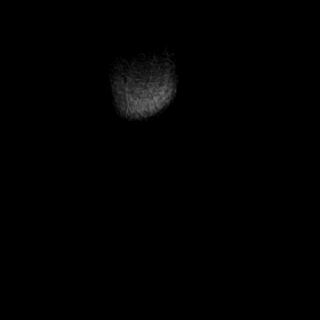
[im 5/30]
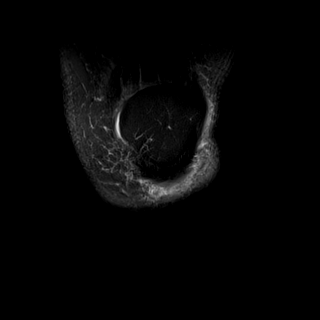
[im 10/30]
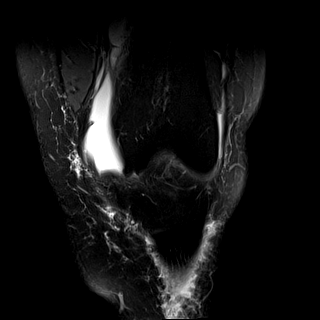
[im 15/30]
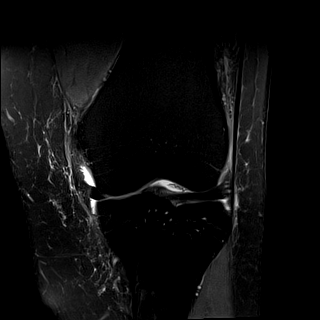
[im 20/30]
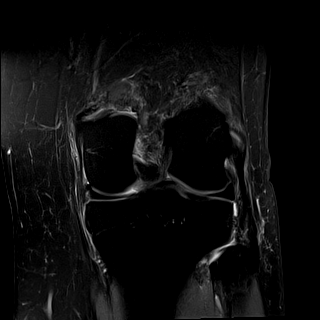
[im 25/30]
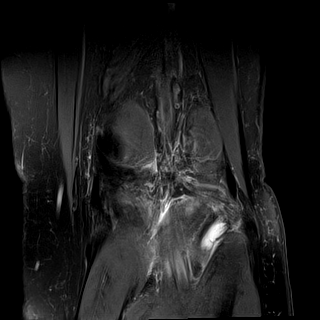
[im 30/30]
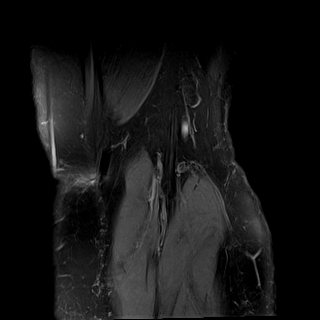

[Series 10: PD fat-sat · sagittal · left · 3.0mm · 0.39mm/px · 6 of 27 slices shown (2 of 2)]
[im 1/27]
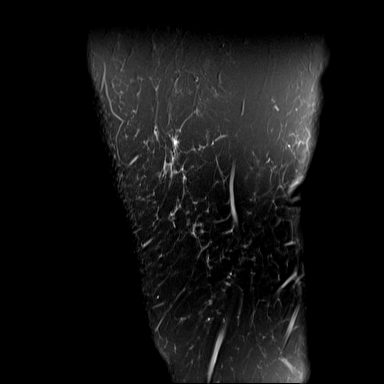
[im 6/27]
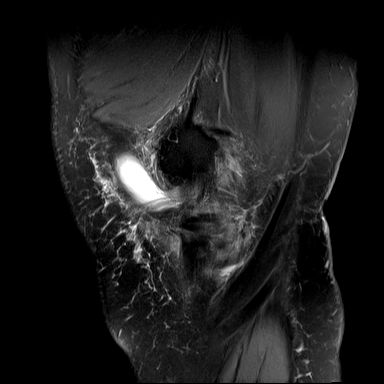
[im 11/27]
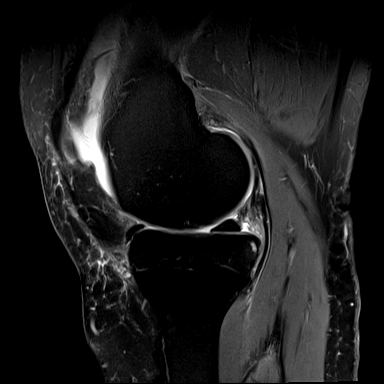
[im 16/27]
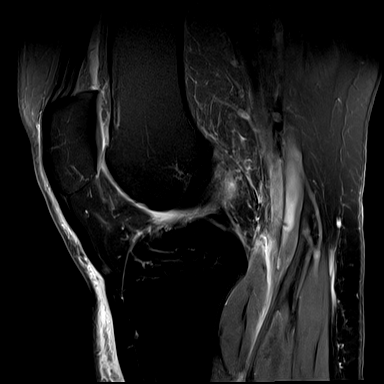
[im 21/27]
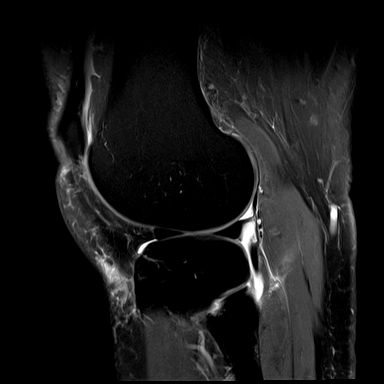
[im 27/27]
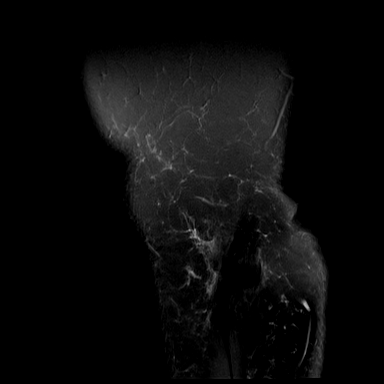

[Series 11: T2 fat-sat · sagittal · left · 3.0mm · 0.39mm/px · 6 of 27 slices shown (3 of 3)]
[im 1/27]
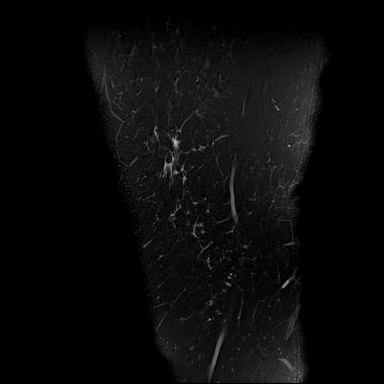
[im 6/27]
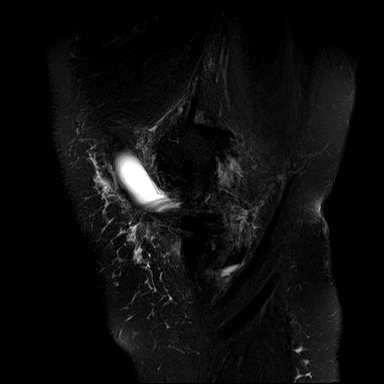
[im 11/27]
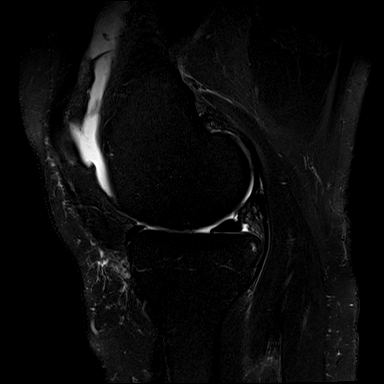
[im 16/27]
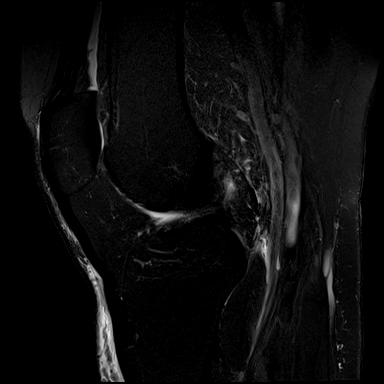
[im 21/27]
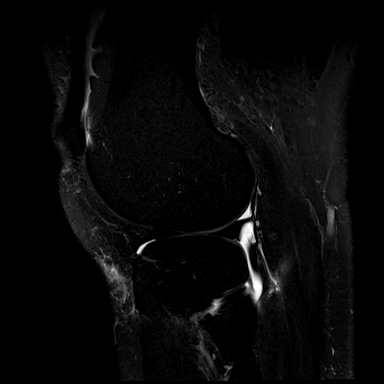
[im 27/27]
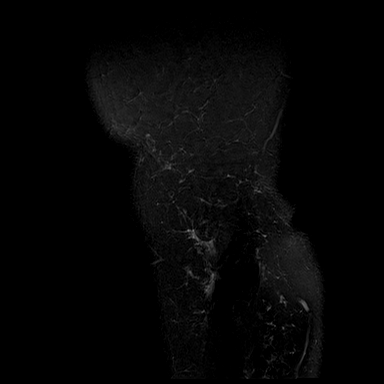

[38 of 40 positions shown; findings below may reference images not displayed]

FINDINGS: MENISCI

Medial meniscus:  Intact

Lateral meniscus:  Intact

LIGAMENTS

Cruciates:  Intact

Collaterals: Some thickening and increased T2 signal intensity in
the proximal fibular collateral ligament near its femoral attachment
site suggesting a ligament sprain or interstitial tear.

The MCL complex is intact.

CARTILAGE

Patellofemoral:  Moderate degenerative chondrosis.

Medial: Moderate degenerative chondrosis with early joint space
narrowing and spurring.

Lateral: Moderate degenerative chondrosis with early joint space
narrowing.

Joint:  Moderate-sized joint effusion. No loose bodies.

Popliteal Fossa: No popliteal mass or Baker's cyst. There is some
fluid tracking back along the popliteus tendon and in the
tibiofibular joint.

Extensor Mechanism: The patella retinacular structures are intact
and the quadriceps and patellar tendons are intact.

Bones:  No acute bony findings.

Other: Normal knee musculature.
IMPRESSION: 1. Moderate tricompartmental degenerative chondrosis.
2. Proximal fibular collateral ligament sprain with interstitial
tears.
3. No meniscal tears.
4. Moderate-sized joint effusion.

## 2020-12-20 ENCOUNTER — Ambulatory Visit
Admission: RE | Admit: 2020-12-20 | Discharge: 2020-12-20 | Disposition: A | Discharge status: Home / Self Care | Payer: Medicare Other | Source: Ambulatory Visit | Attending: Internal Medicine | Admitting: Internal Medicine

## 2020-12-20 ENCOUNTER — Other Ambulatory Visit: Payer: Self-pay

## 2020-12-20 DIAGNOSIS — E2839 Other primary ovarian failure: Secondary | ICD-10-CM

## 2020-12-20 DIAGNOSIS — Z780 Asymptomatic menopausal state: Secondary | ICD-10-CM | POA: Diagnosis not present

## 2020-12-23 ENCOUNTER — Encounter: Payer: Self-pay | Admitting: Gastroenterology

## 2020-12-27 DIAGNOSIS — M25561 Pain in right knee: Secondary | ICD-10-CM | POA: Diagnosis not present

## 2020-12-28 ENCOUNTER — Other Ambulatory Visit: Payer: Self-pay | Admitting: Gastroenterology

## 2021-02-14 DIAGNOSIS — E039 Hypothyroidism, unspecified: Secondary | ICD-10-CM | POA: Diagnosis not present

## 2021-02-14 DIAGNOSIS — E785 Hyperlipidemia, unspecified: Secondary | ICD-10-CM | POA: Diagnosis not present

## 2021-02-14 DIAGNOSIS — R7303 Prediabetes: Secondary | ICD-10-CM | POA: Diagnosis not present

## 2021-02-16 DIAGNOSIS — R7303 Prediabetes: Secondary | ICD-10-CM | POA: Diagnosis not present

## 2021-02-16 DIAGNOSIS — M3500 Sicca syndrome, unspecified: Secondary | ICD-10-CM | POA: Diagnosis not present

## 2021-02-16 DIAGNOSIS — Z13828 Encounter for screening for other musculoskeletal disorder: Secondary | ICD-10-CM | POA: Diagnosis not present

## 2021-02-16 DIAGNOSIS — Z0000 Encounter for general adult medical examination without abnormal findings: Secondary | ICD-10-CM | POA: Diagnosis not present

## 2021-02-16 DIAGNOSIS — I700 Atherosclerosis of aorta: Secondary | ICD-10-CM | POA: Diagnosis not present

## 2021-02-16 DIAGNOSIS — E785 Hyperlipidemia, unspecified: Secondary | ICD-10-CM | POA: Diagnosis not present

## 2021-02-16 DIAGNOSIS — E039 Hypothyroidism, unspecified: Secondary | ICD-10-CM | POA: Diagnosis not present

## 2021-02-16 DIAGNOSIS — R82998 Other abnormal findings in urine: Secondary | ICD-10-CM | POA: Diagnosis not present

## 2021-02-16 DIAGNOSIS — I10 Essential (primary) hypertension: Secondary | ICD-10-CM | POA: Diagnosis not present

## 2021-03-02 DIAGNOSIS — H6123 Impacted cerumen, bilateral: Secondary | ICD-10-CM | POA: Diagnosis not present

## 2021-05-24 DIAGNOSIS — L280 Lichen simplex chronicus: Secondary | ICD-10-CM | POA: Diagnosis not present

## 2021-05-24 DIAGNOSIS — L816 Other disorders of diminished melanin formation: Secondary | ICD-10-CM | POA: Diagnosis not present

## 2021-05-24 DIAGNOSIS — L309 Dermatitis, unspecified: Secondary | ICD-10-CM | POA: Diagnosis not present

## 2021-07-20 DIAGNOSIS — R3129 Other microscopic hematuria: Secondary | ICD-10-CM | POA: Diagnosis not present

## 2021-07-20 DIAGNOSIS — R309 Painful micturition, unspecified: Secondary | ICD-10-CM | POA: Diagnosis not present

## 2021-07-21 DIAGNOSIS — R3129 Other microscopic hematuria: Secondary | ICD-10-CM | POA: Diagnosis not present

## 2021-07-21 DIAGNOSIS — R309 Painful micturition, unspecified: Secondary | ICD-10-CM | POA: Diagnosis not present

## 2021-07-27 DIAGNOSIS — Z1231 Encounter for screening mammogram for malignant neoplasm of breast: Secondary | ICD-10-CM | POA: Diagnosis not present

## 2021-12-18 IMAGING — MG MM DIGITAL DIAGNOSTIC UNILAT*R*
3 series · 3 of 3 positions shown · non-contrast
Comparison: Previous exam(s).

CLINICAL DATA: 69-year-old female for further evaluation of RIGHT
breast calcifications identified on screening mammogram. History of
benign RIGHT breast biopsies in 8898 with development of a large
hematoma.

EXAM:
DIGITAL DIAGNOSTIC UNILATERAL RIGHT MAMMOGRAM
TECHNIQUE: Right digital diagnostic mammography was performed. Mammographic
images were processed with CAD.

[R ML (1 of 2)]
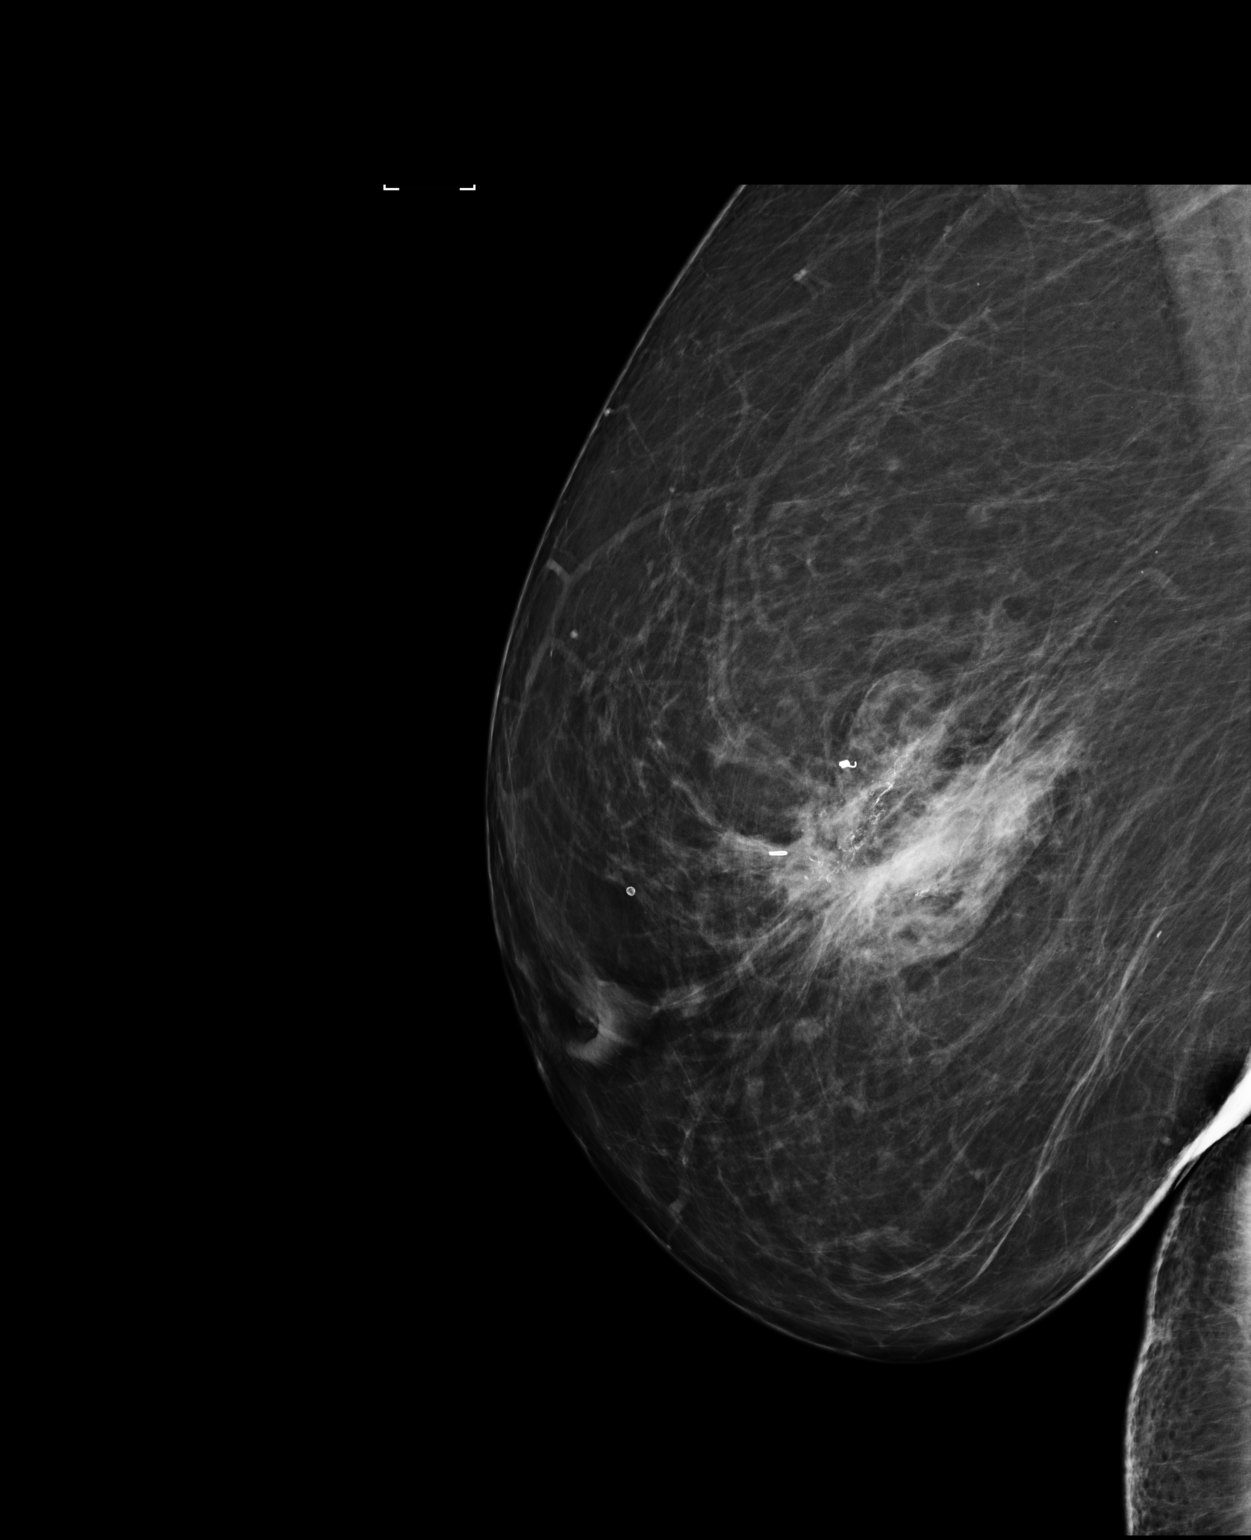

[R ML (2 of 2)]
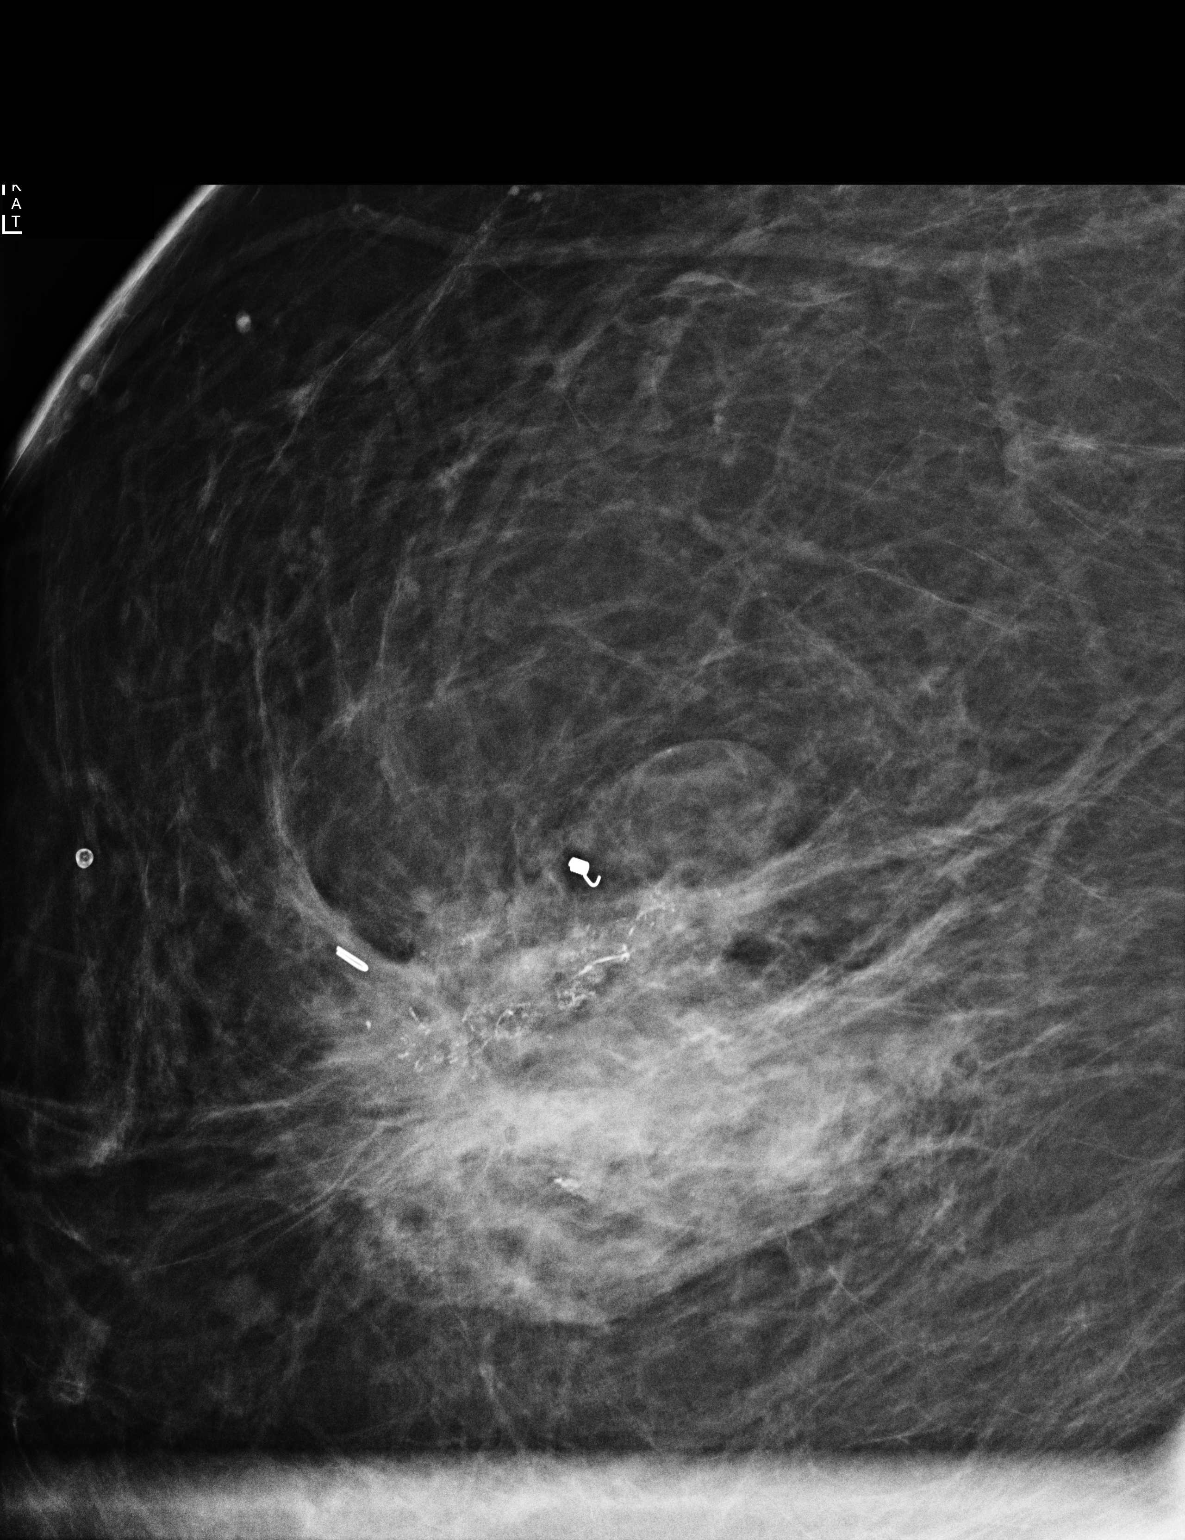

[R CC]
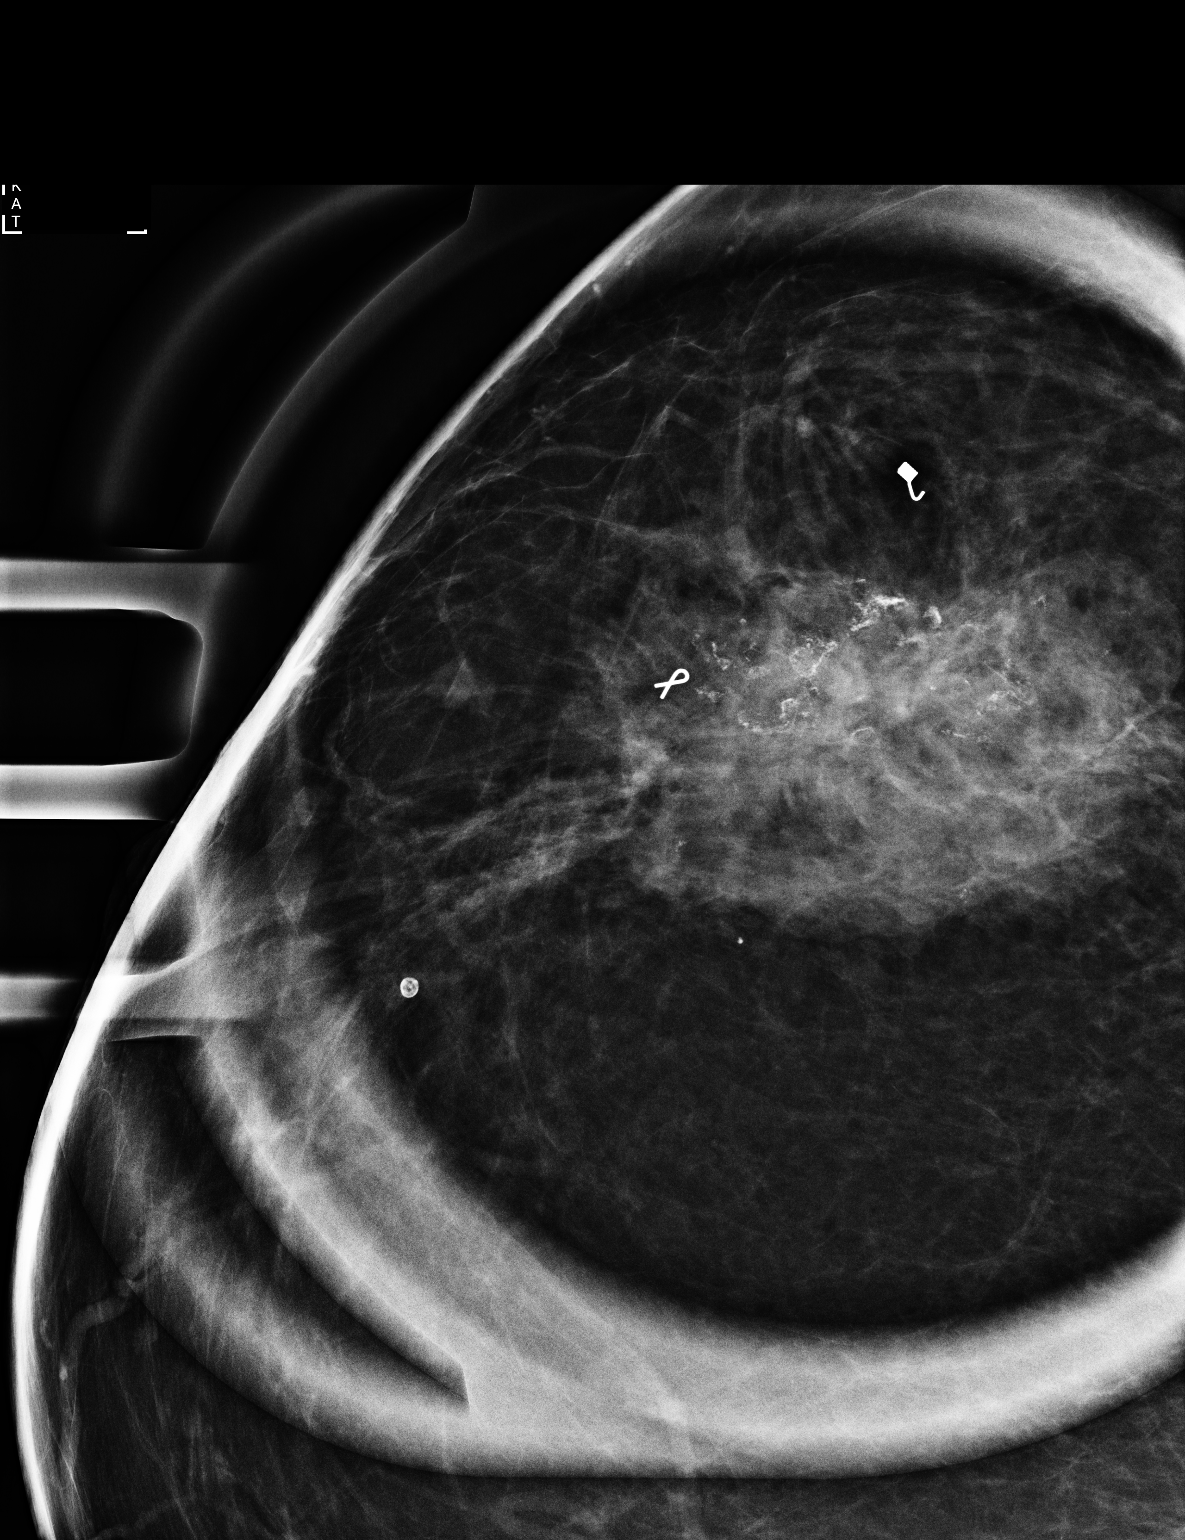

[3 of 3 positions shown; findings below may reference images not displayed]

ACR Breast Density Category b: There are scattered areas of
fibroglandular density.
FINDINGS: Full field and magnification views of the RIGHT breast demonstrate
benign dystrophic calcifications within the UPPER-OUTER RIGHT
breast, corresponding to the screening study findings. Biopsy clips
and evolutionary fat necrosis/hematoma changes within the
UPPER-OUTER RIGHT breast are noted.
IMPRESSION: Benign dystrophic calcifications within the UPPER-OUTER RIGHT breast
accounting for the screening study finding.

RECOMMENDATION:
Bilateral screening mammogram in 1 year.

I have discussed the findings and recommendations with the patient.
If applicable, a reminder letter will be sent to the patient
regarding the next appointment.

BI-RADS CATEGORY  2: Benign.

## 2022-01-02 ENCOUNTER — Telehealth: Payer: Self-pay | Admitting: Gastroenterology

## 2022-01-02 ENCOUNTER — Other Ambulatory Visit: Payer: Self-pay

## 2022-01-02 MED ORDER — AMOXICILLIN-POT CLAVULANATE 875-125 MG PO TABS: 1.0000 | ORAL_TABLET | Freq: Two times a day (BID) | ORAL | 0 refills | Status: AC

## 2022-01-02 NOTE — Telephone Encounter (Signed)
Patient called requesting to speak with a nurse regarding a flare up.

## 2022-01-02 NOTE — Telephone Encounter (Signed)
Spoke with patient regarding MD recommendations. Patient verbalized understanding. Prescription sent to pharmacy. F/U scheduled with Nevin Bloodgood, NP on 01/25/22 at 11:00 am.

## 2022-01-02 NOTE — Telephone Encounter (Signed)
Patient called in with complaints of lower abdominal pain (8/10), nausea, and poor appetite. BM's are normal & pt is able to hold down food/drink. She was last seen with Dr. Silverio Decamp for [place] on 10/03/20 & colon on 12/11/20 for diverticulitis flare. She states she started taking left over amoxicillin yesterday that she had left over from her previous flare. Patient would like to know if she can be prescribed antibiotics or if she will need to be seen for OV.

## 2022-01-04 NOTE — Telephone Encounter (Signed)
Patient called with complaints of nausea since starting the antibiotic two days ago. The pain she was previously experiencing has improved. She has been taking the pill on an empty stomach. She has been advised to start taking the medication with food, along with a bland diet to see if that helps relieve symptoms. If symptoms do not approve, patient understands to call back and let us know. No further questions.

## 2022-01-04 NOTE — Telephone Encounter (Signed)
Patient called states the antibiotics given is effecting her stomach seeking advise.

## 2022-01-25 ENCOUNTER — Ambulatory Visit: Payer: Medicare Other | Admitting: Nurse Practitioner

## 2022-01-25 ENCOUNTER — Other Ambulatory Visit: Payer: Self-pay

## 2022-01-25 ENCOUNTER — Encounter: Payer: Self-pay | Admitting: Nurse Practitioner

## 2022-01-25 ENCOUNTER — Other Ambulatory Visit: Payer: No Typology Code available for payment source

## 2022-01-25 ENCOUNTER — Ambulatory Visit (INDEPENDENT_AMBULATORY_CARE_PROVIDER_SITE_OTHER): Payer: No Typology Code available for payment source | Admitting: Nurse Practitioner

## 2022-01-25 ENCOUNTER — Other Ambulatory Visit (INDEPENDENT_AMBULATORY_CARE_PROVIDER_SITE_OTHER): Payer: No Typology Code available for payment source

## 2022-01-25 VITALS — BP 114/60 | HR 84 | Ht 60.0 in | Wt 209.5 lb

## 2022-01-25 DIAGNOSIS — R109 Unspecified abdominal pain: Secondary | ICD-10-CM

## 2022-01-25 DIAGNOSIS — R300 Dysuria: Secondary | ICD-10-CM | POA: Diagnosis not present

## 2022-01-25 DIAGNOSIS — R1030 Lower abdominal pain, unspecified: Secondary | ICD-10-CM

## 2022-01-25 LAB — URINALYSIS WITH CULTURE, IF INDICATED
Bilirubin Urine: NEGATIVE
Ketones, ur: NEGATIVE
Nitrite: NEGATIVE
RBC / HPF: NONE SEEN (ref 0–?)
Specific Gravity, Urine: 1.02 (ref 1.000–1.030)
Total Protein, Urine: NEGATIVE
Urine Glucose: NEGATIVE
Urobilinogen, UA: 0.2 (ref 0.0–1.0)
pH: 6 (ref 5.0–8.0)

## 2022-01-25 MED ORDER — CIPROFLOXACIN HCL 250 MG PO TABS: 250.0000 mg | ORAL_TABLET | Freq: Two times a day (BID) | ORAL | 0 refills | Status: DC

## 2022-01-25 NOTE — Progress Notes (Signed)
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  Treated the end of May ( by [place]) for presumed recurrent sigmoid diverticulitis. Pain has improved but now with a different type lower abdominal pain which she thinks may be a UTI. Difficult to sort out if this is ongoing diverticulitis and / or UTI. She treated herself with 5 days of septra but with ongoing pain  To try and sort this out will obtain UA and culture if indicated.  Bentyl is on med list but she isn't taking it  Recommend she take as needed for discomfort.  Will contact her with urine results and further recommendations.  If urine is negative and pain persists and need to consider this is still diverticulitis   HPI   Patient is known to Dr. Silverio Decamp.  She was last seen May 2022 for follow-up on sigmoid diverticulitis / diverticular hemorrhage. GI status postembolization of left colic artery in February 2022. Following that she underwent outpatient screening colonoscopy with removal of several adenomatous colon polyps  Patient called the office the end of May with complaints of lower abdominal pain and nausea.  We called in Augmentin for presumed recurrent diverticulitis.  She was given office follow-up appointment.  She called back a couple days later, having nausea from Augmentin.  The pain was improving.  She was advised to take the Augmentin with food.   Interval History:  She is here for follow up. She completed 10 days of Augmentin around the 10th of June. She feels better other than occasional "twinges" of discomfort in lower abdomen. She has a history  UTI and recently had symptoms and took 5  days of Septa ( last dose about 4 days ago) but this am having dysuria and lower abdominal cramping again. She feels the discomfort is related to a UTI since if feels different than the diverticulitis.  Her BMs are normal. No blood in stool. No fevers.   Previous GI Evaluation   May 2022 colonoscopy - One less than 1 mm polyp in the ascending colon, removed with a cold biopsy forceps.. - One 12 mm polyp in the transverse colon, removed piecemeal using a cold snare.  - Severe diverticulosis in the sigmoid colon, in the descending colon, in the transverse colon, in the ascending colon and in the cecum. Purulent discharge was seen in association with the diverticular opening, suspicious of diverticulitis. - Non-bleeding external and internal hemorrhoids Surgical [P], colon, transverse and ascending (cbx), polyp (2) - TUBULAR ADENOMA (X MULTIPLE). - NEGATIVE FOR HIGH GRADE DYSPLASIA.  Labs:     Latest Ref Rng & Units 10/03/2020    2:57 PM 09/29/2020   12:30 AM 09/28/2020    6:06 PM  CBC  WBC 4.0 - 10.5 K/uL 10.0  13.3    Hemoglobin 12.0 - 15.0 g/dL 9.0  8.1    8.2  8.8   Hematocrit 36.0 - 46.0 % 27.3  25.2    25.3  27.9   Platelets 150.0 - 400.0 K/uL 436.0  350         Latest Ref Rng & Units 09/28/2020    9:05 AM  Hepatic Function  Total Protein 6.5 - 8.1 g/dL 6.6   Albumin 3.5 - 5.0  g/dL 3.7   AST 15 - 41 U/L 35   ALT 0 - 44 U/L 43   Alk Phosphatase 38 - 126 U/L 44   Total Bilirubin 0.3 - 1.2 mg/dL 0.5      Past Medical History:  Diagnosis Date   Anemia    Arthritis    Chronic headaches    Diverticulosis    Hyperlipidemia    Hypertension    Hypothyroidism    IBS (irritable bowel syndrome)     Past Surgical History:  Procedure Laterality Date   BREAST BIOPSY Right 2019   IR ANGIOGRAM SELECTIVE EACH ADDITIONAL VESSEL  09/28/2020   IR ANGIOGRAM SELECTIVE EACH ADDITIONAL VESSEL  09/28/2020   IR ANGIOGRAM SELECTIVE EACH ADDITIONAL VESSEL  09/28/2020   IR ANGIOGRAM  SELECTIVE EACH ADDITIONAL VESSEL  09/28/2020   IR ANGIOGRAM VISCERAL SELECTIVE  09/28/2020   IR ANGIOGRAM VISCERAL SELECTIVE  09/28/2020   IR EMBO ART  VEN HEMORR LYMPH EXTRAV  INC GUIDE ROADMAPPING  09/28/2020   IR US GUIDE VASC ACCESS RIGHT  09/28/2020    Current Medications, Allergies, Family History and Social History were reviewed in [employer] record.     Current Outpatient Medications  Medication Sig Dispense Refill   AMBULATORY NON FORMULARY MEDICATION Bone Up 1 tablet in the morning and 2 in the afternoon     ARMOUR THYROID 120 MG tablet Take 120 mg by mouth daily.     Boswellia Serrata (BOSWELLIA PO) Take 2 tablets by mouth 2 (two) times daily.     CALCIUM PO Take 1 tablet by mouth daily.     Carboxymethylcellulose Sodium (EYE DROPS OP) Apply 2 drops to eye 2 (two) times daily as needed (dry eyes).     CHONDROITIN SULFATE PO Take 1 tablet by mouth daily.     Coenzyme Q10 (CO Q-10 PO) Take 1 tablet by mouth in the morning and at bedtime.     DANDELION ROOT PO Take 1 tablet by mouth 2 (two) times daily.     dicyclomine (BENTYL) 10 MG capsule TAKE 1 CAPSULE (10 MG TOTAL) BY MOUTH EVERY 8 (EIGHT) HOURS AS NEEDED FOR SPASMS. 270 capsule 3   Digestive Enzymes (ENZYME DIGEST) CAPS Take 2 capsules by mouth daily.     estradiol (VIVELLE-DOT) 0.05 MG/24HR patch Place 0.05 mg onto the skin once a week.     Ferrous Sulfate (IRON) 325 (65 Fe) MG TABS Take 1 tablet (325 mg total) by mouth 2 (two) times daily with a meal. 30 tablet 0   fluticasone (FLONASE) 50 MCG/ACT nasal spray Place 2 sprays into both nostrils daily.     Glucosamine-Chondroitin 500-400 MG CAPS Take 1 tablet by mouth 3 (three) times daily.     hydrocortisone (ANUSOL-HC) 2.5 % rectal cream Use small pea sized amount twice a day per rectum for 7 days 30 g 1   Magnesium 110 MG CAPS Take 2 capsules by mouth 2 (two) times daily.     Multiple Vitamins-Iron (CHLORELLA PO) Take by mouth. Take 2 tablets in the  morning and 3 In the afternoon     Na Sulfate-K Sulfate-Mg Sulf (SUPREP BOWEL PREP KIT) 17.5-3.13-1.6 GM/177ML SOLN 1 prep kit for colonoscopy 344 mL 0   olmesartan (BENICAR) 20 MG tablet Take 20 mg by mouth daily.     Omega-3 Fatty Acids (FISH OIL PO) Take 1 tablet by mouth 2 (two) times daily.     OVER THE COUNTER MEDICATION Take 1 tablet by mouth daily.  Bone up bone supplement     progesterone (PROMETRIUM) 200 MG capsule Take 200 mg by mouth at bedtime.     QUERCETIN PO Take 2 tablets by mouth 3 (three) times daily.     rosuvastatin (CRESTOR) 10 MG tablet Take 10 mg by mouth daily.     Thiamine HCl (VITAMIN B-1 PO) Take 1 tablet by mouth daily.     VITAMIN D, CHOLECALCIFEROL, PO Take 10,000 Units by mouth daily.      Zinc Sulfate 66 MG TABS Take 132 mg by mouth daily.     Current Facility-Administered Medications  Medication Dose Route Frequency Provider Last Rate Last Admin   0.9 %  sodium chloride infusion  500 mL Intravenous Once Nandigam, Venia Minks, MD        Review of Systems: No chest pain. No shortness of breath. No urinary complaints.    Physical Exam  Wt Readings from Last 3 Encounters:  12/12/20 193 lb (87.5 kg)  10/03/20 193 lb 6 oz (87.7 kg)  09/29/20 205 lb 14.6 oz (93.4 kg)    BP 114/60 (BP Location: Left Arm, Patient Position: Sitting, Cuff Size: Normal)   Pulse 84   Ht 5' (1.524 m) Comment: height measured without shoes  Wt 209 lb 8 oz (95 kg)   BMI 40.92 kg/m  Constitutional:  Generally well appearing female in no acute distress. Psychiatric: Pleasant. Normal mood and affect. Behavior is normal. EENT: Pupils normal.  Conjunctivae are normal. No scleral icterus. Neck supple.  Cardiovascular: Normal rate, regular rhythm. No edema Pulmonary/chest: Effort normal and breath sounds normal. No wheezing, rales or rhonchi. Abdominal: Soft, nondistended, nontender. Bowel sounds active throughout. There are no masses palpable. No hepatomegaly. Neurological: Alert  and oriented to person place and time. Skin: Skin is warm and dry. No rashes noted.  Tye Savoy, NP  01/25/2022, 10:58 AM  Cc:  Michael Boston, MD

## 2022-01-27 LAB — URINE CULTURE
MICRO NUMBER:: 13559432
SPECIMEN QUALITY:: ADEQUATE

## 2022-02-02 ENCOUNTER — Ambulatory Visit
Admission: EM | Admit: 2022-02-02 | Discharge: 2022-02-02 | Disposition: A | Discharge status: Home / Self Care | Payer: No Typology Code available for payment source | Attending: Family Medicine | Admitting: Family Medicine

## 2022-02-02 DIAGNOSIS — R1031 Right lower quadrant pain: Secondary | ICD-10-CM | POA: Insufficient documentation

## 2022-02-02 DIAGNOSIS — R1032 Left lower quadrant pain: Secondary | ICD-10-CM | POA: Insufficient documentation

## 2022-02-02 DIAGNOSIS — N390 Urinary tract infection, site not specified: Secondary | ICD-10-CM | POA: Diagnosis not present

## 2022-02-02 LAB — POCT URINALYSIS DIP (MANUAL ENTRY)
Bilirubin, UA: NEGATIVE
Glucose, UA: NEGATIVE mg/dL
Nitrite, UA: NEGATIVE
Protein Ur, POC: 30 mg/dL — AB
Spec Grav, UA: 1.025 (ref 1.010–1.025)
Urobilinogen, UA: 0.2 E.U./dL
pH, UA: 6 (ref 5.0–8.0)

## 2022-02-02 MED ORDER — FLUCONAZOLE 150 MG PO TABS: 150.0000 mg | ORAL_TABLET | ORAL | 0 refills | Status: DC

## 2022-02-02 MED ORDER — CEPHALEXIN 500 MG PO CAPS: 500.0000 mg | ORAL_CAPSULE | Freq: Two times a day (BID) | ORAL | 0 refills | Status: AC

## 2022-02-02 NOTE — ED Provider Notes (Signed)
[facility]    CSN: [id number] Arrival date & time: 02/02/22  1142      History   Chief Complaint Chief Complaint  Patient presents with   Abdominal Pain    HPI Monica Rogers is a 71 y.o. female.   HPI Patient with a history of diverticular disease, recurrent urinary tract infections, recurrent lower abdominal pain presents today with 1 day occurrence of dysuria, abdominal cramping, and concerned that her most recent UTI has not resolved.  She was seen by her GI doctor on 6/22 and placed on 3 days of Cipro.  She reports while taking medication symptoms never completely resolved.  She further explains that she has had some vaginitis symptoms since May that she has been treating with a home remedy.  She reports completing the entire course of medication.  She has been afebrile but endorses generally just feeling unwell as the symptoms persist.  She has had no nausea, vomiting or chills.  Her most recent urine culture was significant for E. coli.  She has also been treated with Augmentin which she completed the first week of June however the Augmentin was specifically for diverticulitis.  She has also taken intermittent doses of Septra in May and in June for UTI-like symptoms.  She is currently without a primary care provider as her insurance recently changed.  She has not seen a urologist pertaining to her current symptoms of recurrent UTIs. 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   PLANTAR FASCIITIS,  LEFT 11/18/2009   HYPERTHYROIDISM 04/23/2008   HLD (hyperlipidemia) 04/23/2008   OVERWEIGHT 04/23/2008   ANXIETY 04/23/2008   HEADACHE, CHRONIC 04/23/2008   NONSPECIFIC ABNORMAL RESULTS LIVR FUNCTION STUDY 04/23/2008   ANEMIA, DEFICIENCY, HX OF 04/23/2008    Past Surgical History:  Procedure Laterality Date   BREAST BIOPSY Right 2019   IR ANGIOGRAM SELECTIVE EACH ADDITIONAL VESSEL  09/28/2020   IR ANGIOGRAM SELECTIVE EACH ADDITIONAL VESSEL  09/28/2020   IR ANGIOGRAM SELECTIVE EACH ADDITIONAL VESSEL  09/28/2020   IR ANGIOGRAM SELECTIVE EACH ADDITIONAL VESSEL  09/28/2020   IR ANGIOGRAM VISCERAL SELECTIVE  09/28/2020   IR ANGIOGRAM VISCERAL SELECTIVE  09/28/2020   IR EMBO ART  VEN HEMORR LYMPH EXTRAV  INC GUIDE ROADMAPPING  09/28/2020   IR US GUIDE VASC ACCESS RIGHT  09/28/2020    OB History   No obstetric history on file.      Home Medications    Prior to Admission medications   Medication Sig Start Date End Date Taking? Authorizing Provider  cephALEXin (KEFLEX) 500 MG capsule Take 1 capsule (500 mg total) by mouth 2 (two) times daily for 5 days. 02/02/22 02/07/22 Yes Scot Jun, FNP  fluconazole (DIFLUCAN) 150 MG tablet Take 1 tablet (150 mg total) by mouth every 3 (three) days. 02/02/22  Yes Scot Jun, FNP  AMBULATORY NON FORMULARY MEDICATION Bone Up 1 tablet in the morning and 2 in  the afternoon    [provider]  ARMOUR THYROID 120 MG tablet Take 120 mg by mouth daily. 09/13/20   [provider]  Boswellia Serrata (BOSWELLIA PO) Take 2 tablets by mouth 2 (two) times daily.    [provider]  CALCIUM PO Take 1 tablet by mouth daily.    [provider]  Carboxymethylcellulose Sodium (EYE DROPS OP) Apply 2 drops to eye 2 (two) times daily as needed (dry eyes).    [provider]  CHONDROITIN SULFATE PO Take 1 tablet by mouth daily.    [provider]  ciprofloxacin (CIPRO) 250 MG tablet Take 1 tablet (250 mg total) by  mouth 2 (two) times daily. 01/25/22   Willia Craze, NP  Coenzyme Q10 (CO Q-10 PO) Take 1 tablet by mouth in the morning and at bedtime.    [provider]  DANDELION ROOT PO Take 1 tablet by mouth 2 (two) times daily.    [provider]  dicyclomine (BENTYL) 10 MG capsule TAKE 1 CAPSULE (10 MG TOTAL) BY MOUTH EVERY 8 (EIGHT) HOURS AS NEEDED FOR SPASMS. 12/28/20   Mauri Pole, MD  Digestive Enzymes (ENZYME DIGEST) CAPS Take 2 capsules by mouth daily.    [provider]  estradiol (VIVELLE-DOT) 0.05 MG/24HR patch Place 0.05 mg onto the skin once a week. 08/15/18   [provider]  Ferrous Sulfate (IRON) 325 (65 Fe) MG TABS Take 1 tablet (325 mg total) by mouth 2 (two) times daily with a meal. 10/04/20   Nandigam, Venia Minks, MD  fluticasone (FLONASE) 50 MCG/ACT nasal spray Place 2 sprays into both nostrils daily. 09/04/18   [provider]  Glucosamine-Chondroitin 500-400 MG CAPS Take 1 tablet by mouth 3 (three) times daily.    [provider]  hydrocortisone (ANUSOL-HC) 2.5 % rectal cream Use small pea sized amount twice a day per rectum for 7 days 10/03/20   Mauri Pole, MD  Magnesium 110 MG CAPS Take 2 capsules by mouth 2 (two) times daily.    [provider]  Multiple Vitamins-Iron (CHLORELLA PO) Take by mouth. Take 2 tablets in the morning and 3 In the afternoon    [provider]  olmesartan (BENICAR) 20 MG tablet Take 20 mg by mouth daily. 09/17/20   [provider]  Omega-3 Fatty Acids (FISH OIL PO) Take 1 tablet by mouth 2 (two) times daily.    [provider]  OVER THE COUNTER MEDICATION Take 1 tablet by mouth daily. Bone up bone supplement    [provider]  progesterone (PROMETRIUM) 200 MG capsule Take 200 mg by mouth at bedtime. 08/13/18   [provider]  QUERCETIN PO Take 2 tablets by mouth 3 (three) times daily.    [provider]  rosuvastatin (CRESTOR) 10 MG  tablet Take 10 mg by mouth daily. 09/17/20   [provider]  Thiamine HCl (VITAMIN B-1 PO) Take 1 tablet by mouth daily.    [provider]  VITAMIN D, CHOLECALCIFEROL, PO Take 10,000 Units by mouth daily.     [provider]  Zinc Sulfate 66 MG TABS Take 132 mg by mouth daily.    [provider]    Family History Family History  Problem Relation Age of Onset   Hearing loss Mother    Miscarriages / [place] Mother    Alcohol abuse Father    Hearing loss Father    Lung cancer Father  smoker   Hypertension Father    Diabetes Paternal Grandmother     Social History Social History   Tobacco Use   Smoking status: Never   Smokeless tobacco: Never  Vaping Use   Vaping Use: Never used  Substance Use Topics   Alcohol use: No   Drug use: No     Allergies   Patient has no known allergies.   Review of Systems Review of Systems Pertinent negatives listed in HPI   Physical Exam Triage Vital Signs ED Triage Vitals  Enc Vitals Group     BP 02/02/22 1205 115/67     Pulse Rate 02/02/22 1205 76     Resp 02/02/22 1205 20     Temp 02/02/22 1205 97.6 F (36.4 C)     Temp Source 02/02/22 1205 Oral     SpO2 02/02/22 1205 93 %     Weight --      Height --      Head Circumference --      Peak Flow --      Pain Score 02/02/22 1204 8     Pain Loc --      Pain Edu? --      Excl. in [place]? --    No data found.  Updated Vital Signs BP 115/67 (BP Location: Right Arm)   Pulse 76   Temp 97.6 F (36.4 C) (Oral)   Resp 20   SpO2 94%   Visual Acuity Right Eye Distance:   Left Eye Distance:   Bilateral Distance:    Right Eye Near:   Left Eye Near:    Bilateral Near:     Physical Exam General appearance:Alert, well developed, well nourished, cooperative  Head: Normocephalic, without obvious abnormality, atraumatic Respiratory: Respirations even and unlabored, normal respiratory rate Heart: Rate and rhythm normal.   Abdominal:  Protuberant, soft, no tenderness elicited with palpation CVA:  no flank pain Extremities: No gross deformities Skin: Skin color, texture, turgor normal. No rashes seen  Psych: Appropriate mood and affect.   UC Treatments / Results  Labs (all labs ordered are listed, but only abnormal results are displayed) Labs Reviewed  POCT URINALYSIS DIP (MANUAL ENTRY) - Abnormal; Notable for the following components:      Result Value   Clarity, UA cloudy (*)    Ketones, POC UA trace (5) (*)    Blood, UA trace-intact (*)    Protein Ur, POC =30 (*)    Leukocytes, UA Large (3+) (*)    All other components within normal limits  URINE CULTURE    EKG   Radiology No results found.  Procedures Procedures (including critical care time)  Medications Ordered in UC Medications - No data to display  Initial Impression / Assessment and Plan / UC Course  I have reviewed the triage vital signs and the nursing notes.  Pertinent labs & imaging results that were available during my care of the patient were reviewed by me and considered in my medical decision making (see chart for details).    Recurrent UTIs with bilateral lower abdominal cramping  Suspect issues may be related to bladder incompetence or urinary retention.  I have placed a referral for patient to be evaluated by your gynecologist at [facility].  Patient was recently treated with multiple antibiotics this month most recently Cipro.  Given the high risk associated with recurrent prolonged Cipro use opted to treat patient with Keflex twice daily for 5 days for suspected urinary tract infection.  She is also had symptoms of vaginitis for over a month which could be contributing to unresolved UTIs therefore I am treating her with Diflucan.  Advised to hydrate well with fluids.  Advised ER if any red flag symptoms develop.  [facility] to [facility] appointment.  Patient verbalized understanding and agreement  with plan today. Final Clinical Impressions(s) / UC Diagnoses   Final diagnoses:  Recurrent UTI  Bilateral lower abdominal cramping     Discharge Instructions      Urine culture is pending.  I am treating you with Keflex twice daily for 5 days.  We will contact you by phone if any change in treatment is warranted after your urine culture results.  Hydrate well with fluids.  Avoid any caffeine.  I am also treating you for a yeast infection.  Take 1 dose of Diflucan today and repeat in 3 days. If at any point your symptoms worsen, you develop any nausea, vomiting or fever go to the nearest emergency department.     ED Prescriptions     Medication Sig Dispense Auth. Provider   cephALEXin (KEFLEX) 500 MG capsule Take 1 capsule (500 mg total) by mouth 2 (two) times daily for 5 days. 10 capsule Scot Jun, FNP   fluconazole (DIFLUCAN) 150 MG tablet Take 1 tablet (150 mg total) by mouth every 3 (three) days. 2 tablet Scot Jun, FNP      PDMP not reviewed this encounter.   Scot Jun, FNP 02/02/22 1308

## 2022-02-02 NOTE — Discharge Instructions (Signed)
Urine culture is pending.  I am treating you with Keflex twice daily for 5 days.  We will contact you by phone if any change in treatment is warranted after your urine culture results.  Hydrate well with fluids.  Avoid any caffeine.  I am also treating you for a yeast infection.  Take 1 dose of Diflucan today and repeat in 3 days. If at any point your symptoms worsen, you develop any nausea, vomiting or fever go to the nearest emergency department.

## 2022-02-02 NOTE — ED Triage Notes (Signed)
Pt states she has been having UTIs on and off for a few months. She reports having abd cramping and weakness. The patient states she recently finished her antibiotic prescription (cipro). Started: this morning

## 2022-02-04 LAB — URINE CULTURE: Special Requests: NORMAL

## 2022-02-09 ENCOUNTER — Ambulatory Visit
Admission: EM | Admit: 2022-02-09 | Discharge: 2022-02-09 | Disposition: A | Discharge status: Home / Self Care | Payer: No Typology Code available for payment source | Attending: Urgent Care | Admitting: Urgent Care

## 2022-02-09 ENCOUNTER — Encounter: Payer: Self-pay | Admitting: Emergency Medicine

## 2022-02-09 DIAGNOSIS — R102 Pelvic and perineal pain: Secondary | ICD-10-CM | POA: Diagnosis not present

## 2022-02-09 DIAGNOSIS — N3001 Acute cystitis with hematuria: Secondary | ICD-10-CM | POA: Diagnosis not present

## 2022-02-09 DIAGNOSIS — R300 Dysuria: Secondary | ICD-10-CM | POA: Diagnosis not present

## 2022-02-09 LAB — POCT URINALYSIS DIP (MANUAL ENTRY)
Bilirubin, UA: NEGATIVE
Glucose, UA: NEGATIVE mg/dL
Ketones, POC UA: NEGATIVE mg/dL
Nitrite, UA: NEGATIVE
Protein Ur, POC: NEGATIVE mg/dL
Spec Grav, UA: 1.025 (ref 1.010–1.025)
Urobilinogen, UA: 0.2 E.U./dL
pH, UA: 6 (ref 5.0–8.0)

## 2022-02-09 MED ORDER — NITROFURANTOIN MONOHYD MACRO 100 MG PO CAPS: 100.0000 mg | ORAL_CAPSULE | Freq: Two times a day (BID) | ORAL | 0 refills | Status: DC

## 2022-02-09 MED ORDER — PHENAZOPYRIDINE HCL 200 MG PO TABS: 200.0000 mg | ORAL_TABLET | Freq: Three times a day (TID) | ORAL | 0 refills | Status: DC | PRN

## 2022-02-09 MED ORDER — FLUCONAZOLE 150 MG PO TABS: 150.0000 mg | ORAL_TABLET | ORAL | 0 refills | Status: DC

## 2022-02-09 NOTE — ED Provider Notes (Signed)
[place]   MRN: [id number] DOB: 10-Jul-1951  Subjective:   Monica Rogers is a 71 y.o. female presenting for 1 day history of recurrent suprapubic pain with urination, malodorous urine.  Patient was seen 02/02/2022 for similar symptoms and started on Keflex twice daily for and urinary tract infection.  Urine culture was equivocal.  She did contact a urogynecologist and unfortunately they were not able to see her until December.  Prior to that she had been diagnosed with an UTI on 01/25/2022 and underwent a course of Cipro.  The urine culture did show an E. coli infection and that particular culture showed resistance to a lot of antibiotics.  However Cipro was not one of them.  She does have a history of diverticulitis but reports that those symptoms are very different and has no concerns for the.  No flank tenderness, fevers, nausea, vomiting, frank hematuria, bloody stools.  Patient hydrates with about 32 ounces of water daily.  She has 1 to 2 cups of unsweet tea daily.  No sodas, coffee, alcohol.   Current Facility-Administered Medications:    0.9 %  sodium chloride infusion, 500 mL, Intravenous, Once, Nandigam, Kavitha V, MD  Current Outpatient Medications:    AMBULATORY NON FORMULARY MEDICATION, Bone Up 1 tablet in the morning and 2 in the afternoon, Disp: , Rfl:    ARMOUR THYROID 120 MG tablet, Take 120 mg by mouth daily., Disp: , Rfl:    Boswellia Serrata (BOSWELLIA PO), Take 2 tablets by mouth 2 (two) times daily., Disp: , Rfl:    CALCIUM PO, Take 1 tablet by mouth daily., Disp: , Rfl:    Carboxymethylcellulose Sodium (EYE DROPS OP), Apply 2 drops to eye 2 (two) times daily as needed (dry eyes)., Disp: , Rfl:    CHONDROITIN SULFATE PO, Take 1 tablet by mouth daily., Disp: , Rfl:    ciprofloxacin (CIPRO) 250 MG tablet, Take 1 tablet (250 mg total) by mouth 2 (two) times daily., Disp: 6 tablet, Rfl: 0   Coenzyme Q10 (CO Q-10 PO), Take 1 tablet by mouth in the  morning and at bedtime., Disp: , Rfl:    DANDELION ROOT PO, Take 1 tablet by mouth 2 (two) times daily., Disp: , Rfl:    dicyclomine (BENTYL) 10 MG capsule, TAKE 1 CAPSULE (10 MG TOTAL) BY MOUTH EVERY 8 (EIGHT) HOURS AS NEEDED FOR SPASMS., Disp: 270 capsule, Rfl: 3   Digestive Enzymes (ENZYME DIGEST) CAPS, Take 2 capsules by mouth daily., Disp: , Rfl:    estradiol (VIVELLE-DOT) 0.05 MG/24HR patch, Place 0.05 mg onto the skin once a week., Disp: , Rfl:    Ferrous Sulfate (IRON) 325 (65 Fe) MG TABS, Take 1 tablet (325 mg total) by mouth 2 (two) times daily with a meal., Disp: 30 tablet, Rfl: 0   fluconazole (DIFLUCAN) 150 MG tablet, Take 1 tablet (150 mg total) by mouth every 3 (three) days., Disp: 2 tablet, Rfl: 0   fluticasone (FLONASE) 50 MCG/ACT nasal spray, Place 2 sprays into both nostrils daily., Disp: , Rfl:    Glucosamine-Chondroitin 500-400 MG CAPS, Take 1 tablet by mouth 3 (three) times daily., Disp: , Rfl:    hydrocortisone (ANUSOL-HC) 2.5 % rectal cream, Use small pea sized amount twice a day per rectum for 7 days, Disp: 30 g, Rfl: 1   Magnesium 110 MG CAPS, Take 2 capsules by mouth 2 (two) times daily., Disp: , Rfl:    Multiple Vitamins-Iron (CHLORELLA PO), Take by mouth. Take  2 tablets in the morning and 3 In the afternoon, Disp: , Rfl:    olmesartan (BENICAR) 20 MG tablet, Take 20 mg by mouth daily., Disp: , Rfl:    Omega-3 Fatty Acids (FISH OIL PO), Take 1 tablet by mouth 2 (two) times daily., Disp: , Rfl:    OVER THE COUNTER MEDICATION, Take 1 tablet by mouth daily. Bone up bone supplement, Disp: , Rfl:    progesterone (PROMETRIUM) 200 MG capsule, Take 200 mg by mouth at bedtime., Disp: , Rfl:    QUERCETIN PO, Take 2 tablets by mouth 3 (three) times daily., Disp: , Rfl:    rosuvastatin (CRESTOR) 10 MG tablet, Take 10 mg by mouth daily., Disp: , Rfl:    Thiamine HCl (VITAMIN B-1 PO), Take 1 tablet by mouth daily., Disp: , Rfl:    VITAMIN D, CHOLECALCIFEROL, PO, Take 10,000 Units by  mouth daily. , Disp: , Rfl:    Zinc Sulfate 66 MG TABS, Take 132 mg by mouth daily., Disp: , Rfl:    No Known Allergies  Past Medical History:  Diagnosis Date   Anemia    Arthritis    Chronic headaches    Diverticulosis    Hyperlipidemia    Hypertension    Hypothyroidism    IBS (irritable bowel syndrome)    Recurrent UTI      Past Surgical History:  Procedure Laterality Date   BREAST BIOPSY Right 2019   IR ANGIOGRAM SELECTIVE EACH ADDITIONAL VESSEL  09/28/2020   IR ANGIOGRAM SELECTIVE EACH ADDITIONAL VESSEL  09/28/2020   IR ANGIOGRAM SELECTIVE EACH ADDITIONAL VESSEL  09/28/2020   IR ANGIOGRAM SELECTIVE EACH ADDITIONAL VESSEL  09/28/2020   IR ANGIOGRAM VISCERAL SELECTIVE  09/28/2020   IR ANGIOGRAM VISCERAL SELECTIVE  09/28/2020   IR EMBO ART  VEN HEMORR LYMPH EXTRAV  INC GUIDE ROADMAPPING  09/28/2020   IR US GUIDE VASC ACCESS RIGHT  09/28/2020    Family History  Problem Relation Age of Onset   Hearing loss Mother    77 / [place] Mother    Alcohol abuse Father    Hearing loss Father    Lung cancer Father        smoker   Hypertension Father    Diabetes Paternal Grandmother     Social History   Tobacco Use   Smoking status: Never   Smokeless tobacco: Never  Vaping Use   Vaping Use: Never used  Substance Use Topics   Alcohol use: No   Drug use: No    ROS   Objective:   Vitals: Pulse 88   Temp 98.6 F (37 C)   Resp 20   SpO2 98%   Physical Exam Constitutional:      General: She is not in acute distress.    Appearance: Normal appearance. She is well-developed. She is not ill-appearing, toxic-appearing or diaphoretic.  HENT:     Head: Normocephalic and atraumatic.     Nose: Nose normal.     Mouth/Throat:     Mouth: Mucous membranes are moist.  Eyes:     General: No scleral icterus.       Right eye: No discharge.        Left eye: No discharge.     Extraocular Movements: Extraocular movements intact.     Conjunctiva/sclera: Conjunctivae  normal.  Cardiovascular:     Rate and Rhythm: Normal rate.  Pulmonary:     Effort: Pulmonary effort is normal.  Abdominal:     General: Bowel sounds  are normal. There is no distension.     Palpations: Abdomen is soft. There is no mass.     Tenderness: There is no abdominal tenderness. There is no right CVA tenderness, left CVA tenderness, guarding or rebound.  Skin:    General: Skin is warm and dry.  Neurological:     General: No focal deficit present.     Mental Status: She is alert and oriented to person, place, and time.  Psychiatric:        Mood and Affect: Mood normal.        Behavior: Behavior normal.        Thought Content: Thought content normal.        Judgment: Judgment normal.     Results for orders placed or performed during the hospital encounter of 02/09/22 (from the past 24 hour(s))  POCT urinalysis dipstick     Status: Abnormal   Collection Time: 02/09/22  3:04 PM  Result Value Ref Range   Color, UA yellow yellow   Clarity, UA cloudy (A) clear   Glucose, UA negative negative mg/dL   Bilirubin, UA negative negative   Ketones, POC UA negative negative mg/dL   Spec Grav, UA 1.025 1.010 - 1.025   Blood, UA trace-intact (A) negative   pH, UA 6.0 5.0 - 8.0   Protein Ur, POC negative negative mg/dL   Urobilinogen, UA 0.2 0.2 or 1.0 E.U./dL   Nitrite, UA Negative Negative   Leukocytes, UA Moderate (2+) (A) Negative    Assessment and Plan :   PDMP not reviewed this encounter.  1. Acute cystitis with hematuria   2. Suprapubic pain   3. Dysuria     Start Macrobid given her recent use of Cipro and Keflex to cover for acute persistent cystitis, urine culture pending.  Recommended aggressive hydration, limiting urinary irritants.  She will continue to try to get in with the urogynecologist sooner than December.  Counseled patient on potential for adverse effects with medications prescribed/recommended today, ER and return-to-clinic precautions discussed, patient  verbalized understanding.    Jaynee Eagles, [place] 02/09/22 1535

## 2022-02-09 NOTE — ED Triage Notes (Signed)
Pt here with suprapubic cramping and weakness since today. Pt has a referral to Urogynecology, but the first available is in December.

## 2022-02-09 NOTE — Discharge Instructions (Addendum)
Please start Macrobid to address an urinary tract infection. Make sure you hydrate very well with plain water and a quantity of 64 ounces of water a day.  Please limit drinks that are considered urinary irritants such as soda, sweet tea, coffee, energy drinks, alcohol.  These can worsen your urinary and genital symptoms but also be the source of them.  I will let you know about your urine culture results through MyChart to see if we need to prescribe or change your antibiotics based off of those results.  

## 2022-02-11 LAB — URINE CULTURE: Culture: 40000 — AB

## 2022-02-12 ENCOUNTER — Telehealth: Payer: Self-pay

## 2022-02-12 NOTE — Telephone Encounter (Signed)
Pt phone call returned, Patient verification complete (name and date of birth).   Pt stated the prescribed Macrobid is causing GI issues (abd cramping) and the patient wants to know if there is another medication she can take. She states she only took one days worth of the medication. The patient was made aware per the provider It is best to be seen again and re-evaluated since her last visit was about three days ago. The patient was made aware that she might have to be given an intramuscular antibiotic since her symptoms have not gotten better to go along with the oral medication (antibiotic) per the provider. The patient states she will come to the clinic tomorrow. All questions were answered.

## 2022-02-13 ENCOUNTER — Ambulatory Visit
Admission: EM | Admit: 2022-02-13 | Discharge: 2022-02-13 | Disposition: A | Discharge status: Home / Self Care | Payer: No Typology Code available for payment source | Attending: Emergency Medicine | Admitting: Emergency Medicine

## 2022-02-13 DIAGNOSIS — B379 Candidiasis, unspecified: Secondary | ICD-10-CM | POA: Diagnosis not present

## 2022-02-13 DIAGNOSIS — N390 Urinary tract infection, site not specified: Secondary | ICD-10-CM | POA: Insufficient documentation

## 2022-02-13 DIAGNOSIS — T3695XA Adverse effect of unspecified systemic antibiotic, initial encounter: Secondary | ICD-10-CM | POA: Insufficient documentation

## 2022-02-13 LAB — POCT URINALYSIS DIP (MANUAL ENTRY)
Bilirubin, UA: NEGATIVE
Blood, UA: NEGATIVE
Glucose, UA: 100 mg/dL — AB
Nitrite, UA: POSITIVE — AB
Protein Ur, POC: 100 mg/dL — AB
Spec Grav, UA: 1.025 (ref 1.010–1.025)
Urobilinogen, UA: 1 E.U./dL
pH, UA: 5.5 (ref 5.0–8.0)

## 2022-02-13 MED ORDER — CEFTRIAXONE SODIUM 1 G IJ SOLR
1.0000 g | Freq: Once | INTRAMUSCULAR | Status: AC
  Administered 2022-02-13: 1 g via INTRAMUSCULAR

## 2022-02-13 MED ORDER — CIPROFLOXACIN HCL 500 MG PO TABS: 500.0000 mg | ORAL_TABLET | Freq: Two times a day (BID) | ORAL | 0 refills | Status: AC

## 2022-02-13 MED ORDER — FLUCONAZOLE 150 MG PO TABS: ORAL_TABLET | ORAL | 1 refills | Status: DC

## 2022-02-13 NOTE — ED Triage Notes (Signed)
The patient states she was not able to finish prescribed antibiotic due to GI discomfort. The patient states she is still having discomfort with urination. The patient took AZO last night around 0300.  Started: about 4 days ago.

## 2022-02-13 NOTE — ED Provider Notes (Signed)
[facility]    CSN: [id number] Arrival date & time: 02/13/22  1123    HISTORY   Chief Complaint  Patient presents with   Abdominal Pain   HPI Monica Rogers is a pleasant, 71 y.o. female who presents to urgent care today complaining of being unable to complete Macrobid due to GI upset.  The Macrobid was prescribed for recurrent urinary tract infection and based on prior urine culture results showing patient had E. coli that was susceptible to [place].  Patient states she only took it for 2 days.  Patient states that last night she began to have significant bladder spasm and pain with urination and decided to take an Azo which she had hesitated to do knowing that she was going to be seen today and did not want her urinalysis to be affected.  The history is provided by the patient.   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   PLANTAR FASCIITIS, LEFT 11/18/2009   HYPERTHYROIDISM 04/23/2008   HLD (hyperlipidemia) 04/23/2008   OVERWEIGHT 04/23/2008   ANXIETY 04/23/2008   HEADACHE, CHRONIC 04/23/2008   NONSPECIFIC ABNORMAL RESULTS LIVR FUNCTION STUDY 04/23/2008   ANEMIA, DEFICIENCY, HX OF 04/23/2008   Past Surgical History:  Procedure Laterality Date   BREAST BIOPSY Right 2019   IR ANGIOGRAM SELECTIVE EACH ADDITIONAL VESSEL  09/28/2020   IR ANGIOGRAM SELECTIVE EACH ADDITIONAL VESSEL  09/28/2020   IR ANGIOGRAM SELECTIVE EACH ADDITIONAL VESSEL  09/28/2020   IR ANGIOGRAM SELECTIVE EACH ADDITIONAL  VESSEL  09/28/2020   IR ANGIOGRAM VISCERAL SELECTIVE  09/28/2020   IR ANGIOGRAM VISCERAL SELECTIVE  09/28/2020   IR EMBO ART  VEN HEMORR LYMPH EXTRAV  INC GUIDE ROADMAPPING  09/28/2020   IR US GUIDE VASC ACCESS RIGHT  09/28/2020   OB History   No obstetric history on file.    Home Medications    Prior to Admission medications   Medication Sig Start Date End Date Taking? Authorizing Provider  ciprofloxacin (CIPRO) 500 MG tablet Take 1 tablet (500 mg total) by mouth every 12 (twelve) hours for 10 days. 02/13/22 02/23/22 Yes Lynden Oxford Scales, PA-C  fluconazole (DIFLUCAN) 150 MG tablet Take first tablet on day 4 of ciprofloxacin.  Take second tablet 3 days after first tablet.  Take third tablet 3 days after second tablet if needed. 02/13/22  Yes Lynden Oxford Scales, PA-C  AMBULATORY NON FORMULARY MEDICATION Bone Up 1 tablet in the morning and 2 in the afternoon    [provider]  ARMOUR THYROID 120 MG tablet Take 120 mg by mouth daily. 09/13/20   [provider]  Boswellia Serrata (BOSWELLIA PO) Take 2 tablets by mouth 2 (two) times daily.    [provider]  CALCIUM PO Take 1 tablet by mouth daily.    [provider]  Carboxymethylcellulose Sodium (EYE DROPS OP) Apply 2 drops to eye 2 (two) times daily as needed (dry eyes).  [provider]  CHONDROITIN SULFATE PO Take 1 tablet by mouth daily.    [provider]  Coenzyme Q10 (CO Q-10 PO) Take 1 tablet by mouth in the morning and at bedtime.    [provider]  DANDELION ROOT PO Take 1 tablet by mouth 2 (two) times daily.    [provider]  dicyclomine (BENTYL) 10 MG capsule TAKE 1 CAPSULE (10 MG TOTAL) BY MOUTH EVERY 8 (EIGHT) HOURS AS NEEDED FOR SPASMS. 12/28/20   Mauri Pole, MD  Digestive Enzymes (ENZYME DIGEST) CAPS Take 2 capsules by mouth daily.    [provider]  estradiol (VIVELLE-DOT) 0.05 MG/24HR patch Place 0.05 mg onto the skin once a  week. 08/15/18   [provider]  Ferrous Sulfate (IRON) 325 (65 Fe) MG TABS Take 1 tablet (325 mg total) by mouth 2 (two) times daily with a meal. 10/04/20   Nandigam, Venia Minks, MD  fluticasone (FLONASE) 50 MCG/ACT nasal spray Place 2 sprays into both nostrils daily. 09/04/18   [provider]  Glucosamine-Chondroitin 500-400 MG CAPS Take 1 tablet by mouth 3 (three) times daily.    [provider]  hydrocortisone (ANUSOL-HC) 2.5 % rectal cream Use small pea sized amount twice a day per rectum for 7 days 10/03/20   Mauri Pole, MD  Magnesium 110 MG CAPS Take 2 capsules by mouth 2 (two) times daily.    [provider]  Multiple Vitamins-Iron (CHLORELLA PO) Take by mouth. Take 2 tablets in the morning and 3 In the afternoon    [provider]  nitrofurantoin, macrocrystal-monohydrate, (MACROBID) 100 MG capsule Take 1 capsule (100 mg total) by mouth 2 (two) times daily. 02/09/22   Jaynee Eagles, PA-C  olmesartan (BENICAR) 20 MG tablet Take 20 mg by mouth daily. 09/17/20   [provider]  Omega-3 Fatty Acids (FISH OIL PO) Take 1 tablet by mouth 2 (two) times daily.    [provider]  OVER THE COUNTER MEDICATION Take 1 tablet by mouth daily. Bone up bone supplement    [provider]  phenazopyridine (PYRIDIUM) 200 MG tablet Take 1 tablet (200 mg total) by mouth 3 (three) times daily as needed for pain. 02/09/22   Jaynee Eagles, PA-C  progesterone (PROMETRIUM) 200 MG capsule Take 200 mg by mouth at bedtime. 08/13/18   [provider]  QUERCETIN PO Take 2 tablets by mouth 3 (three) times daily.    [provider]  rosuvastatin (CRESTOR) 10 MG tablet Take 10 mg by mouth daily. 09/17/20   [provider]  Thiamine HCl (VITAMIN B-1 PO) Take 1 tablet by mouth daily.    [provider]  VITAMIN D, CHOLECALCIFEROL, PO Take 10,000 Units by mouth daily.     [provider]  Zinc Sulfate 66 MG TABS Take  132 mg by mouth daily.    [provider]    Family History Family History  Problem Relation Age of Onset   Hearing loss Mother    Miscarriages / [place] Mother    Alcohol abuse Father    Hearing loss Father    Lung cancer Father        smoker   Hypertension Father    Diabetes Paternal Grandmother    Social History Social History   Tobacco Use   Smoking status: Never   Smokeless tobacco: Never  Vaping Use   Vaping Use: Never used  Substance Use Topics   Alcohol use: No   Drug  use: No   Allergies   Patient has no active allergies.  Review of Systems Review of Systems Pertinent findings revealed after performing a 14 point review of systems has been noted in the history of present illness.  Physical Exam Triage Vital Signs ED Triage Vitals  Enc Vitals Group     BP 06/02/21 0827 (!) 147/82     Pulse Rate 06/02/21 0827 72     Resp 06/02/21 0827 18     Temp 06/02/21 0827 98.3 F (36.8 C)     Temp Source 06/02/21 0827 Oral     SpO2 06/02/21 0827 98 %     Weight --      Height --      Head Circumference --      Peak Flow --      Pain Score 06/02/21 0826 5     Pain Loc --      Pain Edu? --      Excl. in [place]? --   No data found.  Updated Vital Signs BP 123/79 (BP Location: Right Arm)   Pulse 75   Temp 98 F (36.7 C) (Oral)   Resp 18   SpO2 93%   Physical Exam Vitals and nursing note reviewed.  Constitutional:      General: She is not in acute distress.    Appearance: Normal appearance. She is obese. She is not ill-appearing or diaphoretic.  HENT:     Head: Normocephalic and atraumatic.  Eyes:     General: Lids are normal.        Right eye: No discharge.        Left eye: No discharge.     Extraocular Movements: Extraocular movements intact.     Conjunctiva/sclera: Conjunctivae normal.     Right eye: Right conjunctiva is not injected.     Left eye: Left conjunctiva is not injected.  Neck:     Trachea: Trachea and phonation normal.   Cardiovascular:     Rate and Rhythm: Normal rate and regular rhythm.     Pulses: Normal pulses.     Heart sounds: Normal heart sounds. No murmur heard.    No friction rub. No gallop.  Pulmonary:     Effort: Pulmonary effort is normal. No accessory muscle usage, prolonged expiration or respiratory distress.     Breath sounds: Normal breath sounds. No stridor, decreased air movement or transmitted upper airway sounds. No decreased breath sounds, wheezing, rhonchi or rales.  Chest:     Chest wall: No tenderness.  Abdominal:     General: Abdomen is flat. Bowel sounds are normal. There is no distension.     Palpations: Abdomen is soft.     Tenderness: There is abdominal tenderness in the suprapubic area. There is no right CVA tenderness or left CVA tenderness.     Hernia: No hernia is present.  Musculoskeletal:        General: Normal range of motion.     Cervical back: Normal range of motion and neck supple. Normal range of motion.  Lymphadenopathy:     Cervical: No cervical adenopathy.  Skin:    General: Skin is warm and dry.     Findings: No erythema or rash.  Neurological:     General: No focal deficit present.     Mental Status: She is alert and oriented to person, place, and time.  Psychiatric:        Mood and Affect: Mood normal.  Behavior: Behavior normal.     Visual Acuity Right Eye Distance:   Left Eye Distance:   Bilateral Distance:    Right Eye Near:   Left Eye Near:    Bilateral Near:     UC Couse / Diagnostics / Procedures:     Radiology No results found.  Procedures Procedures (including critical care time) EKG  Pending results:  Labs Reviewed  POCT URINALYSIS DIP (MANUAL ENTRY) - Abnormal; Notable for the following components:      Result Value   Color, UA orange (*)    Clarity, UA cloudy (*)    Glucose, UA =100 (*)    Ketones, POC UA trace (5) (*)    Protein Ur, POC =100 (*)    Nitrite, UA Positive (*)    Leukocytes, UA Large (3+) (*)     All other components within normal limits  URINE CULTURE    Medications Ordered in UC: Medications  cefTRIAXone (ROCEPHIN) injection 1 g (1 g Intramuscular Given 02/13/22 1239)    UC Diagnoses / Final Clinical Impressions(s)   I have reviewed the triage vital signs and the nursing notes.  Pertinent labs & imaging results that were available during my care of the patient were reviewed by me and considered in my medical decision making (see chart for details).    Final diagnoses:  Recurrent urinary tract infection  Antibiotic-induced yeast infection   Discussed multiple causes of recurrent urinary tract infection including insufficient treatment and anatomical causes.  Patient states she has an appointment set up with a urologist early next month which I recommend she keeps.  Patient provided with an injection of ceftriaxone 1 g during her visit today and a 10-day course of ciprofloxacin 500 mg twice daily.  Given the nature of antibiotics causing vaginal yeast infections, have provided her with a prescription for Diflucan as well.  Return precautions advised.   ED Prescriptions     Medication Sig Dispense Auth. Provider   ciprofloxacin (CIPRO) 500 MG tablet Take 1 tablet (500 mg total) by mouth every 12 (twelve) hours for 10 days. 20 tablet Lynden Oxford Scales, PA-C   fluconazole (DIFLUCAN) 150 MG tablet Take first tablet on day 4 of ciprofloxacin.  Take second tablet 3 days after first tablet.  Take third tablet 3 days after second tablet if needed. 3 tablet Lynden Oxford Scales, PA-C      PDMP not reviewed this encounter.  Disposition Upon Discharge:  Condition: stable for discharge home  Patient presented with concern for an acute illness with associated systemic symptoms and significant discomfort requiring urgent management. In my opinion, this is a condition that a prudent lay person (someone who possesses an average knowledge of health and medicine) may potentially  expect to result in complications if not addressed urgently such as respiratory distress, impairment of bodily function or dysfunction of bodily organs.   As such, the patient has been evaluated and assessed, work-up was performed and treatment was provided in alignment with urgent care protocols and evidence based medicine.  Patient/parent/caregiver has been advised that the patient may require follow up for further testing and/or treatment if the symptoms continue in spite of treatment, as clinically indicated and appropriate.  Routine symptom specific, illness specific and/or disease specific instructions were discussed with the patient and/or caregiver at length.  Prevention strategies for avoiding STD exposure were also discussed.  The patient will follow up with their current PCP if and as advised. If the patient does not currently have  a PCP we will assist them in obtaining one.   The patient may need specialty follow up if the symptoms continue, in spite of conservative treatment and management, for further workup, evaluation, consultation and treatment as clinically indicated and appropriate.  Patient/parent/caregiver verbalized understanding and agreement of plan as discussed.  All questions were addressed during visit.  Please see discharge instructions below for further details of plan.  Discharge Instructions:   Discharge Instructions      During your visit today you received an injection of 1 g of ceftriaxone which is recommended for complicated urinary tract infection.    Please pick up and begin ciprofloxacin 500 mg twice daily for full 10-day course as soon as possible.  If you are able to take your first 2 doses today, 8 hours apart, that would be ideal.  To treat the inevitable vaginal yeast infection that you will acquire after taking 10 days of antibiotics, I provided you with a prescription for Diflucan and multiple doses.  Please be sure you separate each dose by 3 days  and I also recommend that, if you can, you wait until the fourth day of ciprofloxacin to take Diflucan for best results.  As we discussed, you are looking for a new gynecologist, please reach out to Dr. Dorien Chihuahua who is one of the professors of gynecology at [employer].  It is easiest to get an appointment with her at the [facility].  You can find her bio and contact information on the [facility] website.  Also as we discussed, if this aggressive antibiotic therapy does not completely resolve your symptoms or if your symptoms return after a few weeks, I think it is very important that you have a complete pelvic exam to rule out any anatomical causes for recurrent urinary tract infection.  You may wish to consider discussing preventative antibiotic therapy with your primary care provider while you are waiting on these appointments with urology and gynecology.  You may also wish to discuss vaginal estrogen with your primary care provider which can be used in conjunction with patch hormone replacement.  Vaginal estrogen improves the turgor of the vaginal tissue and structures.  Also promotes normal production of vaginal moisture and reduce your risk of vaginal yeast infections.  Thank you for visiting urgent care today.  I wish you all the best in your endeavors to resolve this issue.    This office note has been dictated using Museum/gallery curator.  Unfortunately, this method of dictation can sometimes lead to typographical or grammatical errors.  I apologize for your inconvenience in advance if this occurs.  Please do not hesitate to reach out to me if clarification is needed.       Lynden Oxford Scales, PA-C 02/13/22 1246

## 2022-02-13 NOTE — Discharge Instructions (Addendum)
During your visit today you received an injection of 1 g of ceftriaxone which is recommended for complicated urinary tract infection.    Please pick up and begin ciprofloxacin 500 mg twice daily for full 10-day course as soon as possible.  If you are able to take your first 2 doses today, 8 hours apart, that would be ideal.  To treat the inevitable vaginal yeast infection that you will acquire after taking 10 days of antibiotics, I provided you with a prescription for Diflucan and multiple doses.  Please be sure you separate each dose by 3 days and I also recommend that, if you can, you wait until the fourth day of ciprofloxacin to take Diflucan for best results.  As we discussed, you are looking for a new gynecologist, please reach out to Dr. Dorien Chihuahua who is one of the professors of gynecology at [employer].  It is easiest to get an appointment with her at the [facility].  You can find her bio and contact information on the [facility] website.  Also as we discussed, if this aggressive antibiotic therapy does not completely resolve your symptoms or if your symptoms return after a few weeks, I think it is very important that you have a complete pelvic exam to rule out any anatomical causes for recurrent urinary tract infection.  You may wish to consider discussing preventative antibiotic therapy with your primary care provider while you are waiting on these appointments with urology and gynecology.  You may also wish to discuss vaginal estrogen with your primary care provider which can be used in conjunction with patch hormone replacement.  Vaginal estrogen improves the turgor of the vaginal tissue and structures.  Also promotes normal production of vaginal moisture and reduce your risk of vaginal yeast infections.  Thank you for visiting urgent care today.  I wish you all the best in your endeavors to resolve this issue.

## 2022-02-15 LAB — URINE CULTURE: Culture: >=100000 — AB

## 2022-02-22 ENCOUNTER — Other Ambulatory Visit: Payer: Self-pay | Admitting: Family Medicine

## 2022-02-22 ENCOUNTER — Encounter: Payer: Self-pay | Admitting: Family Medicine

## 2022-02-22 ENCOUNTER — Ambulatory Visit (INDEPENDENT_AMBULATORY_CARE_PROVIDER_SITE_OTHER): Payer: No Typology Code available for payment source | Admitting: Family Medicine

## 2022-02-22 VITALS — BP 118/70 | HR 82 | Temp 97.8°F | Ht 66.0 in | Wt 209.0 lb

## 2022-02-22 DIAGNOSIS — R531 Weakness: Secondary | ICD-10-CM | POA: Diagnosis not present

## 2022-02-22 DIAGNOSIS — I10 Essential (primary) hypertension: Secondary | ICD-10-CM

## 2022-02-22 DIAGNOSIS — E038 Other specified hypothyroidism: Secondary | ICD-10-CM

## 2022-02-22 DIAGNOSIS — Z1159 Encounter for screening for other viral diseases: Secondary | ICD-10-CM

## 2022-02-22 DIAGNOSIS — R7303 Prediabetes: Secondary | ICD-10-CM | POA: Insufficient documentation

## 2022-02-22 DIAGNOSIS — E7800 Pure hypercholesterolemia, unspecified: Secondary | ICD-10-CM

## 2022-02-22 DIAGNOSIS — D500 Iron deficiency anemia secondary to blood loss (chronic): Secondary | ICD-10-CM | POA: Diagnosis not present

## 2022-02-22 LAB — COMPREHENSIVE METABOLIC PANEL
ALT: 105 U/L — ABNORMAL HIGH (ref 0–35)
AST: 66 U/L — ABNORMAL HIGH (ref 0–37)
Albumin: 4.7 g/dL (ref 3.5–5.2)
Alkaline Phosphatase: 68 U/L (ref 39–117)
BUN: 15 mg/dL (ref 6–23)
CO2: 29 mEq/L (ref 19–32)
Calcium: 10.3 mg/dL (ref 8.4–10.5)
Chloride: 102 mEq/L (ref 96–112)
Creatinine, Ser: 0.63 mg/dL (ref 0.40–1.20)
GFR: 88.19 mL/min (ref >60.00)
Glucose, Bld: 124 mg/dL — ABNORMAL HIGH (ref 70–99)
Potassium: 4.9 mEq/L (ref 3.5–5.1)
Sodium: 141 mEq/L (ref 135–145)
Total Bilirubin: 0.6 mg/dL (ref 0.2–1.2)
Total Protein: 7.4 g/dL (ref 6.0–8.3)

## 2022-02-22 LAB — IBC + FERRITIN
Ferritin: 50.2 ng/mL (ref 10.0–291.0)
Iron: 129 ug/dL (ref 42–145)
Saturation Ratios: 28.5 % (ref 20.0–50.0)
TIBC: 452.2 ug/dL — ABNORMAL HIGH (ref 250.0–450.0)
Transferrin: 323 mg/dL (ref 212.0–360.0)

## 2022-02-22 LAB — CBC WITH DIFFERENTIAL/PLATELET
Basophils Absolute: 0.1 10*3/uL (ref 0.0–0.1)
Basophils Relative: 0.9 % (ref 0.0–3.0)
Eosinophils Absolute: 0.2 10*3/uL (ref 0.0–0.7)
Eosinophils Relative: 2.6 % (ref 0.0–5.0)
HCT: 44.3 % (ref 36.0–46.0)
Hemoglobin: 14.5 g/dL (ref 12.0–15.0)
Lymphocytes Relative: 22.4 % (ref 12.0–46.0)
Lymphs Abs: 1.4 10*3/uL (ref 0.7–4.0)
MCHC: 32.8 g/dL (ref 30.0–36.0)
MCV: 97.2 fl (ref 78.0–100.0)
Monocytes Absolute: 0.7 10*3/uL (ref 0.1–1.0)
Monocytes Relative: 11.2 % (ref 3.0–12.0)
Neutro Abs: 3.9 10*3/uL (ref 1.4–7.7)
Neutrophils Relative %: 62.9 % (ref 43.0–77.0)
Platelets: 251 10*3/uL (ref 150.0–400.0)
RBC: 4.56 Mil/uL (ref 3.87–5.11)
RDW: 14.1 % (ref 11.5–15.5)
WBC: 6.2 10*3/uL (ref 4.0–10.5)

## 2022-02-22 LAB — LIPID PANEL
Cholesterol: 222 mg/dL — ABNORMAL HIGH (ref 0–200)
HDL: 42.8 mg/dL (ref >39.00)
NonHDL: 179.21
Total CHOL/HDL Ratio: 5
Triglycerides: 230 mg/dL — ABNORMAL HIGH (ref 0.0–149.0)
VLDL: 46 mg/dL — ABNORMAL HIGH (ref 0.0–40.0)

## 2022-02-22 LAB — LDL CHOLESTEROL, DIRECT: Direct LDL: 168 mg/dL

## 2022-02-22 LAB — VITAMIN B12: Vitamin B-12: >1504 pg/mL — ABNORMAL HIGH (ref 211–911)

## 2022-02-22 LAB — TSH: TSH: 2.06 u[IU]/mL (ref 0.35–5.50)

## 2022-02-22 LAB — CK: Total CK: 79 U/L (ref 7–177)

## 2022-02-22 LAB — HEMOGLOBIN A1C: Hgb A1c MFr Bld: 6.4 % (ref 4.6–6.5)

## 2022-02-22 MED ORDER — ARMOUR THYROID 120 MG PO TABS: 120.0000 mg | ORAL_TABLET | Freq: Every day | ORAL | 3 refills | Status: DC

## 2022-02-22 MED ORDER — OLMESARTAN MEDOXOMIL 20 MG PO TABS: 20.0000 mg | ORAL_TABLET | Freq: Every day | ORAL | 3 refills | Status: DC

## 2022-02-22 NOTE — Patient Instructions (Signed)
Welcome to [facility] at [facility]! It was a pleasure meeting you today.  As discussed, Please schedule a 6 month follow up visit today.  PLEASE NOTE:  If you had any LAB tests please let us know if you have not heard back within a few days. You may see your results on MyChart before we have a chance to review them but we will give you a call once they are reviewed by us. If we ordered any REFERRALS today, please let us know if you have not heard from their office within the next week.  Let us know through MyChart if you are needing REFILLS, or have your pharmacy send us the request. You can also use MyChart to communicate with me or any office staff.  Please try these tips to maintain a healthy lifestyle:  Eat most of your calories during the day when you are active. Eliminate processed foods including packaged sweets (pies, cakes, cookies), reduce intake of potatoes, white bread, white pasta, and white rice. Look for whole grain options, oat flour or almond flour.  Each meal should contain half fruits/vegetables, one quarter protein, and one quarter carbs (no bigger than a computer mouse).  Cut down on sweet beverages. This includes juice, soda, and sweet tea. Also watch fruit intake, though this is a healthier sweet option, it still contains natural sugar! Limit to 3 servings daily.  Drink at least 1 glass of water with each meal and aim for at least 8 glasses per day  Exercise at least 150 minutes every week.   

## 2022-02-22 NOTE — Progress Notes (Signed)
New Patient Office Visit  Subjective:  Patient ID: Monica Rogers, female    DOB: 07-Jul-1951  Age: 71 y.o. MRN: [id number]  CC:  Chief Complaint  Patient presents with   Establish Care    Need new pcp Will be seeing urologist for constant UTI Joint issues Not fasting    HPI Monica Rogers presents for new pt-new ins-was at [place] Dr. Jacalyn Lefevre  Hypothyroidism-on [place] well.  Considering switch to synthroid d/t cost-she will let us know.  HTN-Pt is on benicar '20mg'$  .  Bp's running-not checking.  No ha/dizziness/cp/palp/edema/cough/sob  HLD-not sure if needs to be on meds. Prefers not to be.   Takes digestive enzymes to help w/digestion and prevent diverticulitis(bled out) Freq UTI-on cipro now.  This is 8th one this yr.  Seeing urol/gyn 12/4.  So will see urol aug 4. Concerned my return when off abx.  Knee pain R, L hip pain and knee. and weak L side-taking supps, hands painful as well.  Walks 44mn/d-can't do more d/t pain.    Past Medical History:  Diagnosis Date   Allergy    Anemia    Arthritis    Blood transfusion without reported diagnosis    Chronic headaches    Diverticulosis    Hyperlipidemia    Hypertension    Hypothyroidism    IBS (irritable bowel syndrome)    Prediabetes    Recurrent UTI     Past Surgical History:  Procedure Laterality Date   BREAST BIOPSY Right 2019   IR ANGIOGRAM SELECTIVE EACH ADDITIONAL VESSEL  09/28/2020   IR ANGIOGRAM SELECTIVE EACH ADDITIONAL VESSEL  09/28/2020   IR ANGIOGRAM SELECTIVE EACH ADDITIONAL VESSEL  09/28/2020   IR ANGIOGRAM SELECTIVE EACH ADDITIONAL VESSEL  09/28/2020   IR ANGIOGRAM VISCERAL SELECTIVE  09/28/2020   IR ANGIOGRAM VISCERAL SELECTIVE  09/28/2020   IR EMBO ART  VEN HEMORR LYMPH EXTRAV  INC GUIDE ROADMAPPING  09/28/2020   IR [place] VASC ACCESS RIGHT  09/28/2020   TONSILLECTOMY      Family History  Problem Relation Age of Onset   Hearing loss Mother    M65/ [place]Mother     Alcohol abuse Father    Hearing loss Father    Lung cancer Father        smoker   Hypertension Father    Diabetes Brother    Cancer Maternal Grandmother    Diabetes Paternal Grandmother     Social History   Socioeconomic History   Marital status: Married    Spouse name: Not on file   Number of children: 3   Years of education: Not on file   Highest education level: Not on file  Occupational History   Occupation: Artist  Tobacco Use   Smoking status: Never   Smokeless tobacco: Never  Vaping Use   Vaping Use: Never used  Substance and Sexual Activity   Alcohol use: No   Drug use: No   Sexual activity: Yes    Birth control/protection: None  Other Topics Concern   Not on file  Social History Narrative   1 grand      Retired tPharmacist, hospital   Artist now   Social Determinants of HRadio broadcast assistantStrain: Not on file  Food Insecurity: Not on file  Transportation Needs: Not on file  Physical Activity: Not on file  Stress: Not on file  Social Connections: Not on file  Intimate Partner Violence: Not on file  ROS  ROS: Gen: no fever, chills  Skin: no rash, itching ENT: no ear pain, ear drainage, nasal congestion, rhinorrhea, sinus pressure, sore throat Eyes: no blurry vision, double vision Resp: no cough, wheeze,SOB CV: no CP, palpitations, LE edema,  GI: no heartburn, n/v/d/c, abd pain GU: no dysuria, urgency, frequency, hematuria- MSK: HPI.  More achey as day progresses and more weak.  Neuro: no dizziness, some weakness L side, occ HA Psych: no depression, anxiety, insomnia, SI   Objective:   Today's Vitals: BP 118/70   Pulse 82   Temp 97.8 F (36.6 C) (Temporal)   Ht '5\' 6"'$  (1.676 m)   Wt 209 lb (94.8 kg)   SpO2 95%   BMI 33.73 kg/m   Physical Exam  Gen: WDWN NAD [facility] HEENT: NCAT, conjunctiva not injected, sclera nonicteric NECK:  supple, no thyromegaly, no nodes, no carotid bruits CARDIAC: RRR, S1S2+, no murmur. DP 2+B LUNGS: CTAB. No  wheezes ABDOMEN:  BS+, soft, NTND, No HSM, no masses EXT:  no edema MSK: ms 5/5 BUE, 5/5 RLE, 4+/5 LLE. NEURO: A&O x3.  CN II-XII intact.  PSYCH: normal mood. Good eye contact   Assessment & Plan:   Problem List Items Addressed This Visit       Cardiovascular and Mediastinum   Hypertension - Primary   Relevant Medications   olmesartan (BENICAR) 20 MG tablet   Other Relevant Orders   Comprehensive metabolic panel (Completed)     Endocrine   Hypothyroidism   Relevant Medications   ARMOUR THYROID 120 MG tablet   Other Relevant Orders   TSH (Completed)     Other   HLD (hyperlipidemia)   Relevant Medications   olmesartan (BENICAR) 20 MG tablet   Other Relevant Orders   Lipid panel (Completed)   CK (Completed)   Prediabetes   Relevant Orders   Hemoglobin A1c (Completed)   Other Visit Diagnoses     Weakness       Relevant Orders   CK (Completed)   Ambulatory referral to Physical Therapy   Iron deficiency anemia due to chronic blood loss       Relevant Orders   Vitamin B12 (Completed)   IBC + Ferritin (Completed)   CBC with Differential/Platelet (Completed)   Encounter for hepatitis C screening test for low risk patient       Relevant Orders   Hepatitis C antibody     Hypertension-chronic.  Well-controlled.  Renewed Benicar 20 mg.  Check blood pressures at least monthly.  Work on [employer].  Check CBC, CMP   Hyperlipidemia-chronic.  Unsure of control as no access to old labs.  Patient prefers to be off medication.  She is having some myalgias and weakness.  Wonder if from medications.  Check CK, lipids.  Hold rosuvastatin Hypothyroidism-chronic.  Has been controlled on Armour Thyroid 120.  This was renewed.  Check TSH.  We discussed the variability of the medication.  She also notes that it is getting more expensive.  She will decide whether to continue this or possibly change to Synthroid.  We discussed that if we do, we would need to recheck labs in 2 to 3 months Iron  deficiency anemia-patient had a major GI bleed last year due to diverticulitis.  Labs have not been checked since then.  Will check CBC, iron studies, B12.  She is currently on supplements.  Continue for now Prediabetes-has been doing a little bit of TLC, but has a lot of room for improvement.  Advised to  continue.  Will check A1c Myalgias and weakness especially left lower extremity-not sure if due to arthritis or possibly statin induced.  Will hold rosuvastatin for at least 3 weeks.  Check CPK.  Check other labs.  Refer to physical therapy. Follow-up 6 months and as needed Outpatient Encounter Medications as of 02/22/2022  Medication Sig   AMBULATORY NON FORMULARY MEDICATION Bone Up 1 tablet in the morning and 2 in the afternoon   Boswellia Serrata (BOSWELLIA PO) Take 2 tablets by mouth 2 (two) times daily.   CALCIUM PO Take 1 tablet by mouth daily.   Carboxymethylcellulose Sodium (EYE DROPS OP) Apply 2 drops to eye 2 (two) times daily as needed (dry eyes).   CHONDROITIN SULFATE PO Take 1 tablet by mouth daily.   ciprofloxacin (CIPRO) 500 MG tablet Take 1 tablet (500 mg total) by mouth every 12 (twelve) hours for 10 days.   Coenzyme Q10 (CO Q-10 PO) Take 1 tablet by mouth in the morning and at bedtime.   DANDELION ROOT PO Take 1 tablet by mouth 2 (two) times daily.   dexamethasone (DECADRON) 0.1 % ophthalmic solution SMARTSIG:2 Drop(s) In Ear(s)   dicyclomine (BENTYL) 10 MG capsule TAKE 1 CAPSULE (10 MG TOTAL) BY MOUTH EVERY 8 (EIGHT) HOURS AS NEEDED FOR SPASMS.   Digestive Enzymes (ENZYME DIGEST) CAPS Take 2 capsules by mouth daily.   estradiol (CLIMARA - DOSED IN MG/24 HR) 0.05 mg/24hr patch APPLY 1 PATCH ONCE EVERY WEEK   Ferrous Sulfate (IRON) 325 (65 Fe) MG TABS Take 1 tablet (325 mg total) by mouth 2 (two) times daily with a meal.   fluconazole (DIFLUCAN) 150 MG tablet Take first tablet on day 4 of ciprofloxacin.  Take second tablet 3 days after first tablet.  Take third tablet 3 days  after second tablet if needed.   Fluocinolone Acetonide 0.01 % OIL INSTILL 4 DROPS NIGHTLY INTO BOTH EARS FOR 14 DAYS. THEN STOP AND USE AS NEEDED FOR ITCHING.   fluticasone (FLONASE) 50 MCG/ACT nasal spray Place 2 sprays into both nostrils daily.   Glucosamine-Chondroitin 500-400 MG CAPS Take 1 tablet by mouth 3 (three) times daily.   hydrocortisone (ANUSOL-HC) 2.5 % rectal cream Use small pea sized amount twice a day per rectum for 7 days   Magnesium 110 MG CAPS Take 2 capsules by mouth 2 (two) times daily.   Multiple Vitamins-Iron (CHLORELLA PO) Take by mouth. Take 2 tablets in the morning and 3 In the afternoon   Omega-3 Fatty Acids (FISH OIL PO) Take 1 tablet by mouth 2 (two) times daily.   OVER THE COUNTER MEDICATION Take 1 tablet by mouth daily. Bone up bone supplement   phenazopyridine (PYRIDIUM) 200 MG tablet Take 1 tablet (200 mg total) by mouth 3 (three) times daily as needed for pain.   progesterone (PROMETRIUM) 200 MG capsule Take 200 mg by mouth daily.   QUERCETIN PO Take 2 tablets by mouth 3 (three) times daily.   rosuvastatin (CRESTOR) 10 MG tablet Take 10 mg by mouth daily.   Thiamine HCl (VITAMIN B-1 PO) Take 1 tablet by mouth daily.   VITAMIN D, CHOLECALCIFEROL, PO Take 10,000 Units by mouth daily.    Zinc Sulfate 66 MG TABS Take 132 mg by mouth daily.   [DISCONTINUED] ARMOUR THYROID 120 MG tablet Take 120 mg by mouth daily.   [DISCONTINUED] nitrofurantoin, macrocrystal-monohydrate, (MACROBID) 100 MG capsule Take 1 capsule (100 mg total) by mouth 2 (two) times daily.   [DISCONTINUED] olmesartan (BENICAR) 20 MG tablet  Take 20 mg by mouth daily.   ARMOUR THYROID 120 MG tablet Take 1 tablet (120 mg total) by mouth daily.   olmesartan (BENICAR) 20 MG tablet Take 1 tablet (20 mg total) by mouth daily.   [DISCONTINUED] estradiol (VIVELLE-DOT) 0.05 MG/24HR patch Place 0.05 mg onto the skin once a week.   [DISCONTINUED] progesterone (PROMETRIUM) 200 MG capsule Take 200 mg by mouth at  bedtime.   [DISCONTINUED] 0.9 %  sodium chloride infusion    No facility-administered encounter medications on file as of 02/22/2022.    Follow-up: Return in about 6 months (around 08/25/2022) for htn,predm.   Wellington Hampshire, MD

## 2022-02-22 NOTE — Telephone Encounter (Signed)
Spoke with pharmacy. Insurance will not cover Armour Thyroid and did not give any alternatives to them. Called patient and she she is aware. Per patient: "[facility] sent me [facility] message about it."  Patient said thank you for calling and hung up.

## 2022-02-23 LAB — HEPATITIS C ANTIBODY: Hepatitis C Ab: NONREACTIVE

## 2022-03-01 ENCOUNTER — Telehealth: Payer: Self-pay | Admitting: Family Medicine

## 2022-03-01 ENCOUNTER — Other Ambulatory Visit: Payer: Self-pay | Admitting: *Deleted

## 2022-03-01 DIAGNOSIS — R109 Unspecified abdominal pain: Secondary | ICD-10-CM

## 2022-03-01 NOTE — Telephone Encounter (Signed)
.  Reason for Referral Request:  Pain Worsening  Has Patient been seen by PCP for this complaint? yes  No, Please schedule patient for appointment for complaint.  Yes, Please find out following information:  Reason: Left side pain  Referral to which Specialty: Rheumatology  Preferred office/ provider:  Did not specify

## 2022-03-01 NOTE — Telephone Encounter (Signed)
Referral has been placed. 

## 2022-03-02 ENCOUNTER — Encounter: Payer: Self-pay | Admitting: Family Medicine

## 2022-03-02 ENCOUNTER — Ambulatory Visit (INDEPENDENT_AMBULATORY_CARE_PROVIDER_SITE_OTHER): Payer: No Typology Code available for payment source | Admitting: Family Medicine

## 2022-03-02 VITALS — BP 120/66 | HR 80 | Temp 98.2°F | Ht 66.0 in | Wt 210.4 lb

## 2022-03-02 DIAGNOSIS — N390 Urinary tract infection, site not specified: Secondary | ICD-10-CM | POA: Diagnosis not present

## 2022-03-02 DIAGNOSIS — M2550 Pain in unspecified joint: Secondary | ICD-10-CM | POA: Diagnosis not present

## 2022-03-02 DIAGNOSIS — R109 Unspecified abdominal pain: Secondary | ICD-10-CM

## 2022-03-02 LAB — POCT URINALYSIS DIPSTICK
Bilirubin, UA: NEGATIVE
Blood, UA: NEGATIVE
Glucose, UA: NEGATIVE
Ketones, UA: NEGATIVE
Nitrite, UA: NEGATIVE
Protein, UA: NEGATIVE
Spec Grav, UA: 1.02 (ref 1.010–1.025)
Urobilinogen, UA: 0.2 E.U./dL
pH, UA: 6 (ref 5.0–8.0)

## 2022-03-02 MED ORDER — CIPROFLOXACIN HCL 500 MG PO TABS: 500.0000 mg | ORAL_TABLET | Freq: Two times a day (BID) | ORAL | 0 refills | Status: AC

## 2022-03-02 NOTE — Progress Notes (Signed)
Subjective:     Patient ID: Monica Rogers, female    DOB: Sep 23, 1950, 71 y.o.   MRN: [id number]  Chief Complaint  Patient presents with   Abdominal Pain    Abdominal pain, cramping and pressure that started this morning Recurrent UTI    HPI Woke up this am w/pressure and cramping lower abd.  This is her usual symptoms for UTI. No new back pain.  No dysuria yet-but used to this as pattern.  Urogyn-able to move appt-Aug 15 10:30.    Health Maintenance Due  Topic Date Due   TETANUS/TDAP  Never done   Zoster Vaccines- Shingrix (1 of 2) Never done   Pneumonia Vaccine 23+ Years old (1 - PCV) Never done   COVID-19 Vaccine (2 - Pfizer risk series) 10/12/2019    Past Medical History:  Diagnosis Date   Allergy    Anemia    Arthritis    Blood transfusion without reported diagnosis    Chronic headaches    Diverticulosis    Hyperlipidemia    Hypertension    Hypothyroidism    IBS (irritable bowel syndrome)    Prediabetes    Recurrent UTI     Past Surgical History:  Procedure Laterality Date   BREAST BIOPSY Right 2019   IR ANGIOGRAM SELECTIVE EACH ADDITIONAL VESSEL  09/28/2020   IR ANGIOGRAM SELECTIVE EACH ADDITIONAL VESSEL  09/28/2020   IR ANGIOGRAM SELECTIVE EACH ADDITIONAL VESSEL  09/28/2020   IR ANGIOGRAM SELECTIVE EACH ADDITIONAL VESSEL  09/28/2020   IR ANGIOGRAM VISCERAL SELECTIVE  09/28/2020   IR ANGIOGRAM VISCERAL SELECTIVE  09/28/2020   IR EMBO ART  VEN HEMORR LYMPH EXTRAV  INC GUIDE ROADMAPPING  09/28/2020   IR US GUIDE VASC ACCESS RIGHT  09/28/2020   TONSILLECTOMY      Outpatient Medications Prior to Visit  Medication Sig Dispense Refill   AMBULATORY NON FORMULARY MEDICATION Bone Up 1 tablet in the morning and 2 in the afternoon     ARMOUR THYROID 120 MG tablet Take 1 tablet (120 mg total) by mouth daily. 90 tablet 3   Boswellia Serrata (BOSWELLIA PO) Take 2 tablets by mouth 2 (two) times daily.     CALCIUM PO Take 1 tablet by mouth daily.      Carboxymethylcellulose Sodium (EYE DROPS OP) Apply 2 drops to eye 2 (two) times daily as needed (dry eyes).     CHONDROITIN SULFATE PO Take 1 tablet by mouth daily.     Coenzyme Q10 (CO Q-10 PO) Take 1 tablet by mouth in the morning and at bedtime.     DANDELION ROOT PO Take 1 tablet by mouth 2 (two) times daily.     dexamethasone (DECADRON) 0.1 % ophthalmic solution SMARTSIG:2 Drop(s) In Ear(s)     dicyclomine (BENTYL) 10 MG capsule TAKE 1 CAPSULE (10 MG TOTAL) BY MOUTH EVERY 8 (EIGHT) HOURS AS NEEDED FOR SPASMS. 270 capsule 3   Digestive Enzymes (ENZYME DIGEST) CAPS Take 2 capsules by mouth daily.     estradiol (CLIMARA - DOSED IN MG/24 HR) 0.05 mg/24hr patch APPLY 1 PATCH ONCE EVERY WEEK     Ferrous Sulfate (IRON) 325 (65 Fe) MG TABS Take 1 tablet (325 mg total) by mouth 2 (two) times daily with a meal. 30 tablet 0   fluconazole (DIFLUCAN) 150 MG tablet Take first tablet on day 4 of ciprofloxacin.  Take second tablet 3 days after first tablet.  Take third tablet 3 days after second tablet if needed. 3 tablet 1  Fluocinolone Acetonide 0.01 % OIL INSTILL 4 DROPS NIGHTLY INTO BOTH EARS FOR 14 DAYS. THEN STOP AND USE AS NEEDED FOR ITCHING.     fluticasone (FLONASE) 50 MCG/ACT nasal spray Place 2 sprays into both nostrils daily.     Glucosamine-Chondroitin 500-400 MG CAPS Take 1 tablet by mouth 3 (three) times daily.     hydrocortisone (ANUSOL-HC) 2.5 % rectal cream Use small pea sized amount twice a day per rectum for 7 days 30 g 1   Magnesium 110 MG CAPS Take 2 capsules by mouth 2 (two) times daily.     Multiple Vitamins-Iron (CHLORELLA PO) Take by mouth. Take 2 tablets in the morning and 3 In the afternoon     olmesartan (BENICAR) 20 MG tablet Take 1 tablet (20 mg total) by mouth daily. 90 tablet 3   Omega-3 Fatty Acids (FISH OIL PO) Take 1 tablet by mouth 2 (two) times daily.     OVER THE COUNTER MEDICATION Take 1 tablet by mouth daily. Bone up bone supplement     phenazopyridine (PYRIDIUM) 200  MG tablet Take 1 tablet (200 mg total) by mouth 3 (three) times daily as needed for pain. 15 tablet 0   progesterone (PROMETRIUM) 200 MG capsule Take 200 mg by mouth daily.     QUERCETIN PO Take 2 tablets by mouth 3 (three) times daily.     rosuvastatin (CRESTOR) 10 MG tablet Take 10 mg by mouth daily.     Thiamine HCl (VITAMIN B-1 PO) Take 1 tablet by mouth daily.     VITAMIN D, CHOLECALCIFEROL, PO Take 10,000 Units by mouth daily.      Zinc Sulfate 66 MG TABS Take 132 mg by mouth daily.     No facility-administered medications prior to visit.    No Active Allergies ROS neg/noncontributory except as noted HPI/below L side of body achey and painful-worse off statin      Objective:     BP 120/66   Pulse 80   Temp 98.2 F (36.8 C) (Temporal)   Ht '5\' 6"'$  (1.676 m)   Wt 210 lb 6 oz (95.4 kg)   SpO2 95%   BMI 33.96 kg/m  Wt Readings from Last 3 Encounters:  03/02/22 210 lb 6 oz (95.4 kg)  02/22/22 209 lb (94.8 kg)  01/25/22 209 lb 8 oz (95 kg)    Physical Exam   Gen: WDWN NAD HEENT: NCAT, conjunctiva not injected, sclera nonicteric ABDOMEN:  BS+, soft, NTND, No HSM, no masses. No cvat EXT:  no edema MSK: no gross abnormalities.  NEURO: A&O x3.  CN II-XII intact.  PSYCH: normal mood. Good eye contact  Results for orders placed or performed in visit on 03/02/22  POCT urinalysis dipstick  Result Value Ref Range   Color, UA YELLOW    Clarity, UA CLOUDY    Glucose, UA Negative Negative   Bilirubin, UA NEGATIVE    Ketones, UA NEGATIVE    Spec Grav, UA 1.020 1.010 - 1.025   Blood, UA NEGATIVE    pH, UA 6.0 5.0 - 8.0   Protein, UA Negative Negative   Urobilinogen, UA 0.2 0.2 or 1.0 E.U./dL   Nitrite, UA NEGATIVE    Leukocytes, UA Small (1+) (A) Negative   Appearance     Odor YES         Assessment & Plan:   Problem List Items Addressed This Visit   None Visit Diagnoses     Abdominal pain, unspecified abdominal location    -  Primary   Relevant Orders   POCT  urinalysis dipstick (Completed)   Urine Culture   Frequent UTI       Arthralgia, unspecified joint       Relevant Orders   Ambulatory referral to Rheumatology      Abd pain-lower abd.  Frequent UTI's.  Has appt w/urogyn in 2 wks.  Will tx w/cipro '500mg'$  bid as worked and reviewed sensitivities.  ? If pt has a fistula, other.   Mult arthralgias esp L side-req rheum referral [facility].   Meds ordered this encounter  Medications   ciprofloxacin (CIPRO) 500 MG tablet    Sig: Take 1 tablet (500 mg total) by mouth 2 (two) times daily for 10 days.    Dispense:  20 tablet    Refill:  0    Wellington Hampshire, MD

## 2022-03-04 LAB — URINE CULTURE
MICRO NUMBER:: 13708469
SPECIMEN QUALITY:: ADEQUATE

## 2022-03-05 ENCOUNTER — Other Ambulatory Visit: Payer: Self-pay | Admitting: *Deleted

## 2022-03-05 ENCOUNTER — Telehealth: Payer: Self-pay | Admitting: Family Medicine

## 2022-03-05 MED ORDER — BUTALBITAL-APAP-CAFFEINE 50-325-40 MG PO TABS: 1.0000 | ORAL_TABLET | Freq: Four times a day (QID) | ORAL | 0 refills | Status: DC | PRN

## 2022-03-05 NOTE — Telephone Encounter (Signed)
Spoke with patient, went over lab results. Patient verbalized understanding.

## 2022-03-05 NOTE — Telephone Encounter (Signed)
Patient requests to be called at ph# [phone] to discuss urinalysis results

## 2022-03-13 ENCOUNTER — Ambulatory Visit: Payer: No Typology Code available for payment source | Admitting: Physical Therapy

## 2022-03-14 ENCOUNTER — Ambulatory Visit: Payer: No Typology Code available for payment source | Admitting: Physical Therapy

## 2022-03-14 NOTE — Therapy (Deleted)
OUTPATIENT PHYSICAL THERAPY LOWER EXTREMITY EVALUATION   Patient Name: Monica Rogers MRN: [id number] DOB:1951/05/23, 71 y.o., female Today's Date: 03/14/2022    Past Medical History:  Diagnosis Date   Allergy    Anemia    Arthritis    Blood transfusion without reported diagnosis    Chronic headaches    Diverticulosis    Hyperlipidemia    Hypertension    Hypothyroidism    IBS (irritable bowel syndrome)    Prediabetes    Recurrent UTI    Past Surgical History:  Procedure Laterality Date   BREAST BIOPSY Right 2019   IR ANGIOGRAM SELECTIVE EACH ADDITIONAL VESSEL  09/28/2020   IR ANGIOGRAM SELECTIVE EACH ADDITIONAL VESSEL  09/28/2020   IR ANGIOGRAM SELECTIVE EACH ADDITIONAL VESSEL  09/28/2020   IR ANGIOGRAM SELECTIVE EACH ADDITIONAL VESSEL  09/28/2020   IR ANGIOGRAM VISCERAL SELECTIVE  09/28/2020   IR ANGIOGRAM VISCERAL SELECTIVE  09/28/2020   IR EMBO ART  VEN HEMORR LYMPH EXTRAV  INC GUIDE ROADMAPPING  09/28/2020   IR US GUIDE VASC ACCESS RIGHT  09/28/2020   TONSILLECTOMY     Patient Active Problem List   Diagnosis Date Noted   Prediabetes 02/22/2022   Lower GI bleed 09/29/2020   Hypothyroidism 09/29/2020   Acute blood loss anemia 09/29/2020   Sigmoid diverticulitis 09/28/2020   Hypertension    KNEE PAIN, RIGHT 11/18/2009   ANKLE PAIN, RIGHT 11/18/2009   SINUS TARSI SYNDROME, 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  Rationale for Evaluation and Treatment Rehabilitation  ONSET DATE: ***  SUBJECTIVE:   SUBJECTIVE STATEMENT: ***  PERTINENT  HISTORY: ***  PAIN:  Are you having pain? Yes: NPRS scale: ***/10 Pain location: *** Pain description: *** Aggravating factors: *** Relieving factors: ***  PRECAUTIONS: {Therapy precautions:24002}  WEIGHT BEARING RESTRICTIONS {Yes ***/No:24003}  FALLS:  Has patient fallen in last 6 months? {fallsyesno:27318}  LIVING ENVIRONMENT: Lives with: {OPRC lives with:25569::"lives with their family"} Lives in: {Lives in:25570} Stairs: {opstairs:27293} Has following equipment at home: {Assistive devices:23999}  OCCUPATION: ***  PLOF: {PLOF:24004}  PATIENT GOALS ***   OBJECTIVE:   DIAGNOSTIC FINDINGS: ***  PATIENT SURVEYS:  {rehab surveys:24030}  COGNITION:  Overall cognitive status: {cognition:24006}     SENSATION: {sensation:27233}  EDEMA:  {edema:24020}  MUSCLE LENGTH: Hamstrings: Right *** deg; Left *** deg Thomas test: Right *** deg; Left *** deg  POSTURE: {posture:25561}  PALPATION: ***     LE Measurements Lower Extremity Right 03/14/2022 Left 03/14/2022   A/PROM MMT A/PROM MMT  Hip Flexion      Hip Extension      Hip Abduction      Hip Adduction      Hip Internal rotation      Hip External rotation      Knee Flexion      Knee Extension      Ankle Dorsiflexion      Ankle Plantarflexion      Ankle Inversion      Ankle Eversion       (Blank rows = not tested)  * pain   LOWER EXTREMITY SPECIAL TESTS:  {  LEspecialtests:26242}  FUNCTIONAL TESTS:  {Functional tests:24029}  GAIT: Distance walked: *** Assistive device utilized: {Assistive devices:23999} Level of assistance: {Levels of assistance:24026} Comments: ***    TODAY'S TREATMENT: 03/14/2022  Therapeutic Exercise:  Aerobic: Supine: Prone:  Seated:  Standing: Neuromuscular Re-education: Manual Therapy: Therapeutic Activity: Self Care: Trigger Point Dry Needling:  Modalities:    PATIENT EDUCATION:  Education details: on current presentation, on HEP, on clinical outcomes  score and POC Person educated: Patient Education method: Explanation, Demonstration, and Handouts Education comprehension: verbalized understanding   HOME EXERCISE PROGRAM: ***  ASSESSMENT:  CLINICAL IMPRESSION: Patient is a *** y.o. *** who was seen today for physical therapy evaluation and treatment for ***.    OBJECTIVE IMPAIRMENTS {opptimpairments:25111}.   ACTIVITY LIMITATIONS {activitylimitations:27494}  PARTICIPATION LIMITATIONS: {participationrestrictions:25113}  PERSONAL FACTORS {Personal factors:25162} are also affecting patient's functional outcome.   REHAB POTENTIAL: {rehabpotential:25112}  CLINICAL DECISION MAKING: {clinical decision making:25114}  EVALUATION COMPLEXITY: {Evaluation complexity:25115}  GOALS: Goals reviewed with patient?  yes  SHORT TERM GOALS:  Patient will be independent in self management strategies to improve quality of life and functional outcomes. Baseline: new program Target date: {follow up:25551} Goal status: INITIAL  2.  Patient will report at least 50% improvement in overall symptoms and/or function to demonstrate improved functional mobility Baseline: 0% Target date: {follow up:25551} Goal status: INITIAL  3.  *** Baseline: *** Target date: {follow up:25551} Goal status: INITIAL  4.  *** Baseline: *** Target date: {follow up:25551} Goal status: INITIAL     LONG TERM GOALS:  Patient will report at least 75% improvement in overall symptoms and/or function to demonstrate improved functional mobility Baseline: 0% Target date: {follow up:25551} Goal status: INITIAL  2.  *** Baseline: *** Target date: {follow up:25551} Goal status: INITIAL  3.  *** Baseline: *** Target date: {follow up:25551} Goal status: INITIAL  4.  *** Baseline: *** Target date: {follow up:25551} Goal status: INITIAL   PLAN: PT FREQUENCY: {rehab frequency:25116}  PT DURATION: {rehab duration:25117}  PLANNED INTERVENTIONS:  Therapeutic exercises, Therapeutic activity, Neuromuscular re-education, Balance training, Gait training, Patient/Family education, Self Care, Joint mobilization, Joint manipulation, Orthotic/Fit training, Aquatic Therapy, Dry Needling, Spinal manipulation, Spinal mobilization, Cryotherapy, Moist heat, Ionotophoresis '4mg'$ /ml Dexamethasone, and Manual therapy  PLAN FOR NEXT SESSION: ***   8:22 AM, 03/14/22 Jerene Pitch, DPT Physical Therapy with [facility]

## 2022-03-20 ENCOUNTER — Other Ambulatory Visit (HOSPITAL_COMMUNITY)
Admission: RE | Admit: 2022-03-20 | Discharge: 2022-03-20 | Disposition: A | Discharge status: Home / Self Care | Payer: No Typology Code available for payment source | Attending: Obstetrics and Gynecology | Admitting: Obstetrics and Gynecology

## 2022-03-20 ENCOUNTER — Ambulatory Visit (INDEPENDENT_AMBULATORY_CARE_PROVIDER_SITE_OTHER): Payer: No Typology Code available for payment source | Admitting: Obstetrics and Gynecology

## 2022-03-20 ENCOUNTER — Encounter: Payer: Self-pay | Admitting: Obstetrics and Gynecology

## 2022-03-20 VITALS — BP 111/75 | HR 88 | Ht 64.0 in | Wt 206.0 lb

## 2022-03-20 DIAGNOSIS — N3281 Overactive bladder: Secondary | ICD-10-CM | POA: Diagnosis not present

## 2022-03-20 DIAGNOSIS — B9620 Unspecified Escherichia coli [E. coli] as the cause of diseases classified elsewhere: Secondary | ICD-10-CM | POA: Diagnosis not present

## 2022-03-20 DIAGNOSIS — N816 Rectocele: Secondary | ICD-10-CM

## 2022-03-20 DIAGNOSIS — N8110 Cystocele, unspecified: Secondary | ICD-10-CM | POA: Diagnosis not present

## 2022-03-20 DIAGNOSIS — R82998 Other abnormal findings in urine: Secondary | ICD-10-CM | POA: Insufficient documentation

## 2022-03-20 DIAGNOSIS — N390 Urinary tract infection, site not specified: Secondary | ICD-10-CM | POA: Diagnosis not present

## 2022-03-20 DIAGNOSIS — R159 Full incontinence of feces: Secondary | ICD-10-CM

## 2022-03-20 DIAGNOSIS — N393 Stress incontinence (female) (male): Secondary | ICD-10-CM | POA: Diagnosis not present

## 2022-03-20 LAB — POCT URINALYSIS DIPSTICK
Bilirubin, UA: NEGATIVE
Blood, UA: NEGATIVE
Glucose, UA: NEGATIVE
Ketones, UA: NEGATIVE
Nitrite, UA: NEGATIVE
Protein, UA: NEGATIVE
Spec Grav, UA: 1.025 (ref 1.010–1.025)
Urobilinogen, UA: 0.2 E.U./dL
pH, UA: 6 (ref 5.0–8.0)

## 2022-03-20 MED ORDER — ESTRADIOL 0.1 MG/GM VA CREA: 0.5000 g | TOPICAL_CREAM | VAGINAL | 11 refills | Status: DC

## 2022-03-20 NOTE — Patient Instructions (Signed)
Start vaginal estrogen therapy nightly for two weeks then 2 times weekly at night for treatment of vaginal atrophy (dryness of the vaginal tissues).  Please let us know if the prescription is too expensive and we can look for alternative options.   For infections, start D-mannose and cranberry tablets daily to prevent infections.

## 2022-03-20 NOTE — Progress Notes (Signed)
[facility] New Patient Evaluation and Consultation  Referring Provider: Scot Jun, FNP PCP: Tawnya Crook, MD Date of Service: 03/20/2022  SUBJECTIVE Chief Complaint: New Patient (Initial Visit) Monica Rogers is a 71 y.o. female here for a consult for recurrent UTI. Pt said said she feels a bulge in the vagina.)  History of Present Illness: Monica Rogers is a 71 y.o. White or Caucasian female seen in consultation at the request of FNP Lavell Anchors for evaluation of recurrent UTI.    Review of records significant for: Seen in urgent care for recurrent UTI in June- treated with antibiotics. Also treated for yeast infection with diflucan.   Urine cultures:  - 01/25/22: 10-49,000 E.Coli - 02/02/22: multiple species present - 02/09/22: 40,000 E. Coli (resistant to ampicillin, gent and bactrim), 40,000 Enterococcus - 02/13/22: >100,000 E.Coli (resistant to ampicillin, gent and bactrim) - 03/03/27: mixed urogenital flora  Urinary Symptoms: Leaks urine with cough/ sneeze, exercise, with a full bladder, with movement to the bathroom, and with urgency Leaks 2 time(s) per day.  Pad use: none She is bothered by her UI symptoms.  Day time voids 8.  Nocturia: 1-2 times per night to void. Voiding dysfunction: she empties her bladder well.  does not use a catheter to empty bladder.  When urinating, she feels a weak stream and dribbling after finishing Drinks: cup of tea in AM, about 30 oz water.   UTIs: 9 UTI's in the last year.  Has symptoms of cramping, weakness, urgency with infections. She feels that she may have an infection. She has taken cranberry.  Denies history of blood in urine and kidney or bladder stones  Pelvic Organ Prolapse Symptoms:                  She Admits to a feeling of a bulge the vaginal area. It has been present for 6 months.  She Denies seeing a bulge.  This bulge is bothersome.  Bowel Symptom: Bowel movements: 3 time(s) per day Stool  consistency: soft  or loose Straining: no.  Splinting: no.  Incomplete evacuation: no.  She Admits to accidental bowel leakage / fecal incontinence  Occurs: 1-2 time(s) per week  Consistency with leakage: soft  Bowel regimen: fiber- psyllium twice a day Last colonoscopy: Date 12/2020, Results - severe diverticulitis  Sexual Function Sexually active: no.   Pelvic Pain Denies pelvic pain    Past Medical History:  Past Medical History:  Diagnosis Date   Allergy    Anemia    Arthritis    Blood transfusion without reported diagnosis    Chronic headaches    Diverticulosis    Hyperlipidemia    Hypertension    Hypothyroidism    IBS (irritable bowel syndrome)    Prediabetes    Recurrent UTI      Past Surgical History:   Past Surgical History:  Procedure Laterality Date   BREAST BIOPSY Right 2019   IR ANGIOGRAM SELECTIVE EACH ADDITIONAL VESSEL  09/28/2020   IR ANGIOGRAM SELECTIVE EACH ADDITIONAL VESSEL  09/28/2020   IR ANGIOGRAM SELECTIVE EACH ADDITIONAL VESSEL  09/28/2020   IR ANGIOGRAM SELECTIVE EACH ADDITIONAL VESSEL  09/28/2020   IR ANGIOGRAM VISCERAL SELECTIVE  09/28/2020   IR ANGIOGRAM VISCERAL SELECTIVE  09/28/2020   IR EMBO ART  VEN HEMORR LYMPH EXTRAV  INC GUIDE ROADMAPPING  09/28/2020   IR US GUIDE VASC ACCESS RIGHT  09/28/2020   TONSILLECTOMY       Past OB/GYN History: OB  History  Gravida Para Term Preterm AB Living  '4       1 2  '$ SAB IAB Ectopic Multiple Live Births  1       3    # Outcome Date GA Lbr Len/2nd Weight Sex Delivery Anes PTL Lv  4 Gravida           3 Gravida           2 Gravida           1 SAB             Vaginal deliveries: 3,  Forceps/ Vacuum deliveries: 0, Cesarean section: 0 Menopausal: Yes, Denies vaginal bleeding since menopause Last [facility] She is on an estrogen patch, progesterone and testosterone cream   Medications: She has a current medication list which includes the following  prescription(s): AMBULATORY NON FORMULARY MEDICATION, armour thyroid, boswellia serrata, butalbital-acetaminophen-caffeine, calcium, carboxymethylcellulose sodium, chondroitin sulfate, coenzyme q10, dandelion, dexamethasone, dicyclomine, enzyme digest, estradiol, [START ON 03/22/2022] estradiol, fluconazole, fluocinolone acetonide, fluticasone, glucosamine-chondroitin, magnesium, olmesartan, omega-3 fatty acids, OVER THE COUNTER MEDICATION, phenazopyridine, progesterone, quercetin, rosuvastatin, thiamine hcl, cholecalciferol, and zinc sulfate.   Allergies: Patient has no active allergies.   Social History:  Social History   Tobacco Use   Smoking status: Never   Smokeless tobacco: Never  Vaping Use   Vaping Use: Never used  Substance Use Topics   Alcohol use: No   Drug use: No    Relationship status: married She lives with husband.   She is employed as an Training and development officer. Regular exercise: Yes: walks 5 days a week History of abuse: No  Family History:   Family History  Problem Relation Age of Onset   Hearing loss Mother    Miscarriages / [place] Mother    Alcohol abuse Father    Hearing loss Father    Lung cancer Father        smoker   Hypertension Father    Diabetes Brother    Cancer Maternal Grandmother    Diabetes Paternal Grandmother      Review of Systems: Review of Systems  Constitutional:  Positive for malaise/fatigue. Negative for fever and weight loss.  Respiratory:  Negative for cough, shortness of breath and wheezing.   Cardiovascular:  Negative for chest pain, palpitations and leg swelling.  Gastrointestinal:  Negative for abdominal pain and blood in stool.  Genitourinary:  Negative for dysuria.  Musculoskeletal:  Positive for myalgias.  Skin:  Negative for rash.  Neurological:  Negative for dizziness and headaches.  Endo/Heme/Allergies:  Does not bruise/bleed easily.  Psychiatric/Behavioral:  Negative for depression. The patient is not nervous/anxious.       OBJECTIVE Physical Exam: Vitals:   03/20/22 1047  BP: 111/75  Pulse: 88  Weight: 206 lb (93.4 kg)  Height: '5\' 4"'$  (1.626 m)    Physical Exam Constitutional:      General: She is not in acute distress. Pulmonary:     Effort: Pulmonary effort is normal.  Abdominal:     General: There is no distension.     Palpations: Abdomen is soft.     Tenderness: There is no abdominal tenderness. There is no rebound.  Musculoskeletal:        General: No swelling. Normal range of motion.  Skin:    General: Skin is warm and dry.     Findings: No rash.  Neurological:     Mental Status: She is alert and oriented to person, place, and  time.  Psychiatric:        Mood and Affect: Mood normal.        Behavior: Behavior normal.      GU / Detailed Urogynecologic Evaluation:  Pelvic Exam: Normal external female genitalia; Bartholin's and Skene's glands normal in appearance; urethral meatus normal in appearance, no urethral masses or discharge.   CST: negative  Speculum exam reveals normal vaginal mucosa with atrophy. Cervix normal appearance. Uterus normal single, nontender. Adnexa no mass, fullness, tenderness.     Pelvic floor strength II/V  Pelvic floor musculature: Right levator non-tender, Right obturator non-tender, Left levator non-tender, Left obturator non-tender  POP-Q:   POP-Q  -0.5                                            Aa   -0.5                                           Ba  -7                                              C   5                                            Gh  6                                            Pb  9.5                                            tvl   -1                                            Ap  -1                                            Bp  -9                                              D     Rectal Exam:  Normal sphincter tone, small distal rectocele, enterocoele not present, no rectal masses, no sign of  dyssynergia when asking the patient to bear down.  Post-Void Residual (PVR) by Bladder Scan: In order to evaluate bladder emptying, we discussed obtaining a postvoid residual and she agreed to this procedure.  Procedure: The ultrasound unit was placed on the patient's abdomen  in the suprapubic region after the patient had voided. A PVR of 64 ml was obtained by bladder scan.  Laboratory Results: POC urine: moderate leukocytes   ASSESSMENT AND PLAN Ms. Schnetzer is a 71 y.o. with:  1. Recurrent urinary tract infection   2. Leukocytes in urine   3. Prolapse of anterior vaginal wall   4. Prolapse of posterior vaginal wall   5. SUI (stress urinary incontinence, female)   6. Overactive bladder   7. Incontinence of feces, unspecified fecal incontinence type    Recurrent UTI - For treatment of recurrent urinary tract infections, we discussed management of recurrent UTIs including prophylaxis with a daily low dose antibiotic, transvaginal estrogen therapy, D-mannose, and cranberry supplements.  - Prescribed estrace cream 0.5g nightly for two weeks then twice a week after. She will also try the OTC supplements. Does not want to try prophylactic antibiotics at this time.  - Will send urine today for culture to rule out infeciton.   2. Stage II anterior, Stage II posterior, Stage I apical prolapse - For treatment of pelvic organ prolapse, we discussed options for management including expectant management, conservative management, and surgical management, such as Kegels, a pessary, pelvic floor physical therapy, and specific surgical procedures. - She is not interested in treatment at this time, but is considering a pessary. Empties bladder well so ok to expectantly manage.   3. SUI - For treatment of stress urinary incontinence,  non-surgical options include expectant management, weight loss, physical therapy, as well as a pessary.  She does not want surgery at this time.  - Considering a pessary -  referral placed to pelvic PT  4. OAB - We discussed the symptoms of overactive bladder (OAB), which include urinary urgency, urinary frequency, nocturia, with or without urge incontinence.  While we do not know the exact etiology of OAB, several treatment options exist. We discussed management including behavioral therapy (decreasing bladder irritants, urge suppression strategies, timed voids, bladder retraining), physical therapy, medication. Advanced therapies are also possible.  - She will start with pelvic PT  5. Bowel leakage - continue fiber supplementation - recommended starting with pelvic physical therapy  Return 3 months   Jaquita Folds, MD

## 2022-03-22 ENCOUNTER — Other Ambulatory Visit: Payer: Self-pay | Admitting: Family Medicine

## 2022-03-22 LAB — URINE CULTURE: Culture: 70000 — AB

## 2022-03-22 MED ORDER — NITROFURANTOIN MONOHYD MACRO 100 MG PO CAPS: 100.0000 mg | ORAL_CAPSULE | Freq: Two times a day (BID) | ORAL | 0 refills | Status: AC

## 2022-03-22 NOTE — Progress Notes (Signed)
Pt was called and notified of the message below. Pt verbalized understanding and had no additional questions.

## 2022-03-22 NOTE — Addendum Note (Signed)
Addended by: Jaquita Folds on: 03/22/2022 10:55 AM   Modules accepted: Orders

## 2022-03-26 ENCOUNTER — Telehealth: Payer: Self-pay

## 2022-03-26 NOTE — Telephone Encounter (Signed)
Spoke to patient and she stated that she has already picked up medication. She stated that she did pay for it out of pocket. Right now she want to stay on this medication, may decide to change to synthroid later.

## 2022-03-26 NOTE — Telephone Encounter (Signed)
Monica Rogers is a 71 y.o. female called in because the estradiol cream has been causing itching. Pt advised to stop the cream and I will message Dr Wannetta Sender and call her back. Pt said she will wait for a call. Pt also said she will try the cream again to see it with still causes an itch

## 2022-03-27 NOTE — Telephone Encounter (Signed)
Please ensure she is only using a 0.5 gram of the cream- this is about a fingertip sized amount. She can also decrease to just twice a week at this time, which will help reduce irritation. If irritation continues, there are other formulations (a tablet or a ring) as an alternative.

## 2022-03-28 NOTE — Telephone Encounter (Signed)
Pt was notified of the message below °

## 2022-03-29 ENCOUNTER — Other Ambulatory Visit: Payer: Self-pay | Admitting: *Deleted

## 2022-03-29 ENCOUNTER — Telehealth: Payer: Self-pay | Admitting: Family Medicine

## 2022-03-29 MED ORDER — FLUCONAZOLE 150 MG PO TABS: 150.0000 mg | ORAL_TABLET | Freq: Once | ORAL | 1 refills | Status: AC

## 2022-03-29 NOTE — Telephone Encounter (Signed)
Rx sent to the pharmacy.

## 2022-03-29 NOTE — Telephone Encounter (Signed)
Pt states she has finished antibiotic, now has a yeast infection. She is asking for rx. Please advise  MEDICATION:fluconazole (DIFLUCAN) 150 MG tablet  [id number]  [phone] Fax:  [phone]

## 2022-03-29 NOTE — Telephone Encounter (Signed)
Please advise 

## 2022-04-12 DIAGNOSIS — M25552 Pain in left hip: Secondary | ICD-10-CM | POA: Insufficient documentation

## 2022-04-19 ENCOUNTER — Other Ambulatory Visit: Payer: Self-pay

## 2022-04-19 ENCOUNTER — Encounter: Payer: Self-pay | Admitting: Physical Therapy

## 2022-04-19 ENCOUNTER — Ambulatory Visit
Payer: No Typology Code available for payment source | Attending: Obstetrics and Gynecology | Admitting: Physical Therapy

## 2022-04-19 DIAGNOSIS — N393 Stress incontinence (female) (male): Secondary | ICD-10-CM | POA: Insufficient documentation

## 2022-04-19 DIAGNOSIS — R279 Unspecified lack of coordination: Secondary | ICD-10-CM

## 2022-04-19 DIAGNOSIS — N8110 Cystocele, unspecified: Secondary | ICD-10-CM | POA: Insufficient documentation

## 2022-04-19 DIAGNOSIS — M6281 Muscle weakness (generalized): Secondary | ICD-10-CM

## 2022-04-19 NOTE — Therapy (Signed)
OUTPATIENT PHYSICAL THERAPY FEMALE PELVIC EVALUATION   Patient Name: Monica Rogers MRN: [id number] DOB:[birth year], 71 y.o., female Today's Date: 04/19/2022    Past Medical History:  Diagnosis Date   Allergy    Anemia    Arthritis    Blood transfusion without reported diagnosis    Chronic headaches    Diverticulosis    Hyperlipidemia    Hypertension    Hypothyroidism    IBS (irritable bowel syndrome)    Prediabetes    Recurrent UTI    Past Surgical History:  Procedure Laterality Date   BREAST BIOPSY Right 2019   IR ANGIOGRAM SELECTIVE EACH ADDITIONAL VESSEL  09/28/2020   IR ANGIOGRAM SELECTIVE EACH ADDITIONAL VESSEL  09/28/2020   IR ANGIOGRAM SELECTIVE EACH ADDITIONAL VESSEL  09/28/2020   IR ANGIOGRAM SELECTIVE EACH ADDITIONAL VESSEL  09/28/2020   IR ANGIOGRAM VISCERAL SELECTIVE  09/28/2020   IR ANGIOGRAM VISCERAL SELECTIVE  09/28/2020   IR EMBO ART  VEN HEMORR LYMPH EXTRAV  INC GUIDE ROADMAPPING  09/28/2020   IR US GUIDE VASC ACCESS RIGHT  09/28/2020   TONSILLECTOMY     Patient Active Problem List   Diagnosis Date Noted   Prediabetes 02/22/2022   Lower GI bleed 09/29/2020   Hypothyroidism 09/29/2020   Acute blood loss anemia 09/29/2020   Sigmoid diverticulitis 09/28/2020   Hypertension    KNEE PAIN, RIGHT 11/18/2009   ANKLE PAIN, RIGHT 11/18/2009   SINUS TARSI SYNDROME, 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  Rationale for Evaluation and Treatment  Rehabilitation  ONSET DATE: past 5 month UTIs have doubled in frequency  SUBJECTIVE:                                                                                                                                                                                           SUBJECTIVE STATEMENT: I was sent here due to chronic UTIs.  I am having leakage in the morning or getting up at night can't stop the leakage and have to change the underwear.  During the day when walking to the bathroom.  I have leakage 3-4x per week.  I have UTI  infections a couple x/month since this spring which is more than it has ever been.  Pt has pain and cramping when urinating during the UTI Fluid intake: Yes: 5 glasses of water and black tea     PAIN:  Are you having pain? No  PRECAUTIONS: None  WEIGHT BEARING RESTRICTIONS No  FALLS:  Has patient fallen in last 6 months? No  LIVING ENVIRONMENT: Lives with: lives with their family and lives with their spouse Lives in: House/apartment   OCCUPATION: retired Recreation: walking 5 day/week  PLOF: Independent  PATIENT GOALS stop having UTIs and decrease leakage  PERTINENT HISTORY:  Recurrent UTI, menopause Sexual abuse: No  BOWEL MOVEMENT Pain with bowel movement: No Type of bowel movement:Type (Bristol Stool Scale) normal, Frequency regular, and Strain No Fully empty rectum: Yes:     URINATION Pain with urination: No Fully empty bladder: Yes:   Stream: Weak Urgency: Yes: and not a lot comes out Frequency: every couple days; nocturia 2/night Leakage: Urge to void, Walking to the bathroom, Coughing, and Sneezing Pads: No  INTERCOURSE Pain with intercourse: NO   PREGNANCY Vaginal deliveries 3 Tearing Yes: maybe a little, no stitches   PROLAPSE Cystocele      OBJECTIVE:   DIAGNOSTIC FINDINGS:    PATIENT SURVEYS:    PFIQ-7   COGNITION:  Overall cognitive status: Within functional limits for tasks  assessed     SENSATION:  Light touch: Appears intact   MUSCLE LENGTH: Hamstrings: Right WFL  deg; Left WFL  deg Thomas test:   LUMBAR SPECIAL TESTS:  Active SLR test - core weakness present due to instability of pelvis with ASLR  FUNCTIONAL TESTS:    GAIT:  Comments: WFL                POSTURE: rounded shoulders, increased lumbar lordosis, anterior pelvic tilt, and flexed trunk    PELVIC ALIGNMENT:  LUMBARAROM/PROM  A/PROM A/PROM  eval  Flexion   Extension   Right lateral flexion   Left lateral flexion   Right rotation   Left rotation    (Blank rows = not tested)  LOWER EXTREMITY ROM:  Passive ROM Right eval Left eval  Hip flexion    Hip extension    Hip abduction 4/5 4/5  Hip adduction    Hip internal rotation    Hip external rotation    Knee flexion    Knee extension    Ankle dorsiflexion    Ankle plantarflexion    Ankle inversion    Ankle eversion     (Blank rows = not tested)  LOWER EXTREMITY MMT:  MMT Right eval Left eval  Hip flexion    Hip extension    Hip abduction    Hip adduction    Hip internal rotation  75%  Hip external rotation  75%  Knee flexion    Knee extension    Ankle dorsiflexion    Ankle plantarflexion    Ankle inversion    Ankle eversion      PALPATION:   General  tight lumbar                External Perineal Exam                              Internal Pelvic Floor   Patient confirms identification and approves PT to assess internal pelvic floor and treatment Yes  PELVIC MMT:   MMT eval  Vaginal  2/5 x 3 reps; hold 2 seconds  Internal Anal Sphincter   External Anal Sphincter   Puborectalis   Diastasis Recti   (Blank rows = not tested)        TONE: Low tone anterior wall  PROLAPSE: Anterior wall  TODAY'S TREATMENT  EVAL and initial HEP    PATIENT EDUCATION:  Education details: Access Code: [email or web address] Person educated: Patient Education method: Explanation, Demonstration, Tactile cues, Verbal  cues, and Handouts Education comprehension: verbalized understanding and returned demonstration   HOME EXERCISE PROGRAM: Access Code: [email or web address] URL: [email or web address] Date: 04/19/2022 Prepared by: Jari Favre  Exercises - Supine Pelvic Floor Contraction  - 3 x daily - 7 x weekly - 1 sets - 10 reps - 3 sec hold  ASSESSMENT:  CLINICAL IMPRESSION: Patient is a friendly 71 y.o. female who was seen today for physical therapy evaluation and treatment for incontinence.  Pt has vaginal weakness and anterior wall prolapse  She has been experiencing more UTIs and leakage in the past several months.  Pt has hip weakness bil and limited Lt hip rotation.  She has pelvic floor weakness, core weakness, and decreased endurance.  Pt will benefit from skilled PT to address impairments and improve function for reduced bladder leakage and improved soft tissue around bladder and urethra for reduced risk of UTIs.   OBJECTIVE IMPAIRMENTS decreased coordination, decreased endurance, decreased ROM, decreased strength, impaired flexibility, postural dysfunction, and pain.   ACTIVITY LIMITATIONS continence  PARTICIPATION LIMITATIONS: community activity  PERSONAL FACTORS Age, Time since onset of injury/illness/exacerbation, and 1-2 comorbidities: 3 vaginal deliveries, some tearing  are also affecting patient's functional outcome.   REHAB POTENTIAL: Excellent  CLINICAL DECISION MAKING: Evolving/moderate complexity  EVALUATION COMPLEXITY: Moderate   GOALS: Goals reviewed with patient? Yes  SHORT TERM GOALS: Target date: 05/17/2022  Ind with initial HEP Baseline: Goal status: INITIAL    LONG TERM GOALS: Target date: 07/12/2022   Pt will report 50% less bladder leakage Baseline:  Goal status: INITIAL  2.  Pt will be independent with advanced HEP to maintain improvements made throughout therapy  Baseline:  Goal status: INITIAL  3.  Pt will be able to functional actions  such as walking to bathroom or sneezing without leakage  Baseline:  Goal status: INITIAL  4.  Pt will have nocturia decreased to 1/night at most  Baseline:  Goal status: INITIAL    PLAN: PT FREQUENCY: 1x/week  PT DURATION: 12 weeks  PLANNED INTERVENTIONS: Therapeutic exercises, Therapeutic activity, Neuromuscular re-education, Balance training, Gait training, Patient/Family education, Self Care, Joint mobilization, Dry Needling, Electrical stimulation, Cryotherapy, Moist heat, Taping, Biofeedback, Manual therapy, and Re-evaluation  PLAN FOR NEXT SESSION: vaginal estrogen application?; f/u on kegel isolating with long rest window; try biofeedback next   [employer], PT 04/19/2022, 7:59 AM

## 2022-04-20 DIAGNOSIS — E669 Obesity, unspecified: Secondary | ICD-10-CM | POA: Diagnosis not present

## 2022-04-20 DIAGNOSIS — Z008 Encounter for other general examination: Secondary | ICD-10-CM | POA: Diagnosis not present

## 2022-04-20 DIAGNOSIS — E785 Hyperlipidemia, unspecified: Secondary | ICD-10-CM | POA: Diagnosis not present

## 2022-04-20 DIAGNOSIS — I700 Atherosclerosis of aorta: Secondary | ICD-10-CM | POA: Diagnosis not present

## 2022-04-20 DIAGNOSIS — Z6833 Body mass index (BMI) 33.0-33.9, adult: Secondary | ICD-10-CM | POA: Diagnosis not present

## 2022-04-20 DIAGNOSIS — R2681 Unsteadiness on feet: Secondary | ICD-10-CM | POA: Diagnosis not present

## 2022-04-24 ENCOUNTER — Encounter: Payer: Self-pay | Admitting: Physical Therapy

## 2022-04-24 ENCOUNTER — Ambulatory Visit: Payer: No Typology Code available for payment source | Admitting: Physical Therapy

## 2022-04-24 DIAGNOSIS — R279 Unspecified lack of coordination: Secondary | ICD-10-CM

## 2022-04-24 DIAGNOSIS — N8110 Cystocele, unspecified: Secondary | ICD-10-CM | POA: Diagnosis not present

## 2022-04-24 DIAGNOSIS — M6281 Muscle weakness (generalized): Secondary | ICD-10-CM

## 2022-04-24 NOTE — Therapy (Signed)
OUTPATIENT PHYSICAL THERAPY FEMALE PELVIC EVALUATION   Patient Name: Monica Rogers MRN: [id number] DOB:07/23/1951, 71 y.o., female Today's Date: 04/24/2022   PT End of Session - 04/24/22 1153     Visit Number 2    Date for PT Re-Evaluation 07/12/22    Authorization Type [employer]    PT Start Time 1148    PT Stop Time 1228    PT Time Calculation (min) 40 min    Activity Tolerance Patient tolerated treatment well    Behavior During Therapy WFL for tasks assessed/performed             Past Medical History:  Diagnosis Date   Allergy    Anemia    Arthritis    Blood transfusion without reported diagnosis    Chronic headaches    Diverticulosis    Hyperlipidemia    Hypertension    Hypothyroidism    IBS (irritable bowel syndrome)    Prediabetes    Recurrent UTI    Past Surgical History:  Procedure Laterality Date   BREAST BIOPSY Right 2019   IR ANGIOGRAM SELECTIVE EACH ADDITIONAL VESSEL  09/28/2020   IR ANGIOGRAM SELECTIVE EACH ADDITIONAL VESSEL  09/28/2020   IR ANGIOGRAM SELECTIVE EACH ADDITIONAL VESSEL  09/28/2020   IR ANGIOGRAM SELECTIVE EACH ADDITIONAL VESSEL  09/28/2020   IR ANGIOGRAM VISCERAL SELECTIVE  09/28/2020   IR ANGIOGRAM VISCERAL SELECTIVE  09/28/2020   IR EMBO ART  VEN HEMORR LYMPH EXTRAV  INC GUIDE ROADMAPPING  09/28/2020   IR US GUIDE VASC ACCESS RIGHT  09/28/2020   TONSILLECTOMY     Patient Active Problem List   Diagnosis Date Noted   Prediabetes 02/22/2022   Lower GI bleed 09/29/2020   Hypothyroidism 09/29/2020   Acute blood loss anemia 09/29/2020   Sigmoid diverticulitis 09/28/2020   Hypertension    KNEE PAIN, RIGHT 11/18/2009   ANKLE PAIN, RIGHT 11/18/2009   SINUS TARSI SYNDROME, RIGHT FOOT 11/18/2009   PLANTAR FASCIITIS, LEFT 11/18/2009   HYPERTHYROIDISM 04/23/2008   HLD (hyperlipidemia) 04/23/2008   OVERWEIGHT 04/23/2008   ANXIETY 04/23/2008   HEADACHE, CHRONIC 04/23/2008   NONSPECIFIC ABNORMAL RESULTS LIVR FUNCTION STUDY  04/23/2008   ANEMIA, DEFICIENCY, HX OF 04/23/2008    PCP: Tawnya Crook, MD  REFERRING PROVIDER: Jaquita Folds, MD  REFERRING DIAG:  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  I did the exercises most of the days and have been using the estrogen cream.  PAIN:  Are you having pain? No   Eval SUBJECTIVE STATEMENT: I was sent here due to chronic UTIs.  I am having leakage in the morning or getting up at night can't stop the leakage and have to change the underwear.  During the day when walking to the bathroom.  I have leakage 3-4x per week.  I have UTI infections a couple x/month since this spring which is more than it has ever been.  Pt has pain and cramping when urinating during the UTI Fluid intake: Yes: 5 glasses of water and black tea     PAIN:  Are you having pain? No  PRECAUTIONS: None  WEIGHT BEARING RESTRICTIONS No  FALLS:  Has patient fallen in last 6 months? No  LIVING ENVIRONMENT: Lives with: lives with their family and lives with their spouse Lives in: House/apartment   OCCUPATION: retired Recreation: walking 5 day/week  PLOF: Independent  PATIENT GOALS stop having UTIs and decrease leakage  PERTINENT HISTORY:  Recurrent UTI, menopause Sexual abuse: No  BOWEL MOVEMENT Pain with bowel movement: No Type of bowel movement:Type (Bristol Stool Scale) normal, Frequency regular, and Strain No Fully empty rectum: Yes:     URINATION Pain with urination:  No Fully empty bladder: Yes:   Stream: Weak Urgency: Yes: and not a lot comes out Frequency: every couple days; nocturia 2/night Leakage: Urge to void, Walking to the bathroom, Coughing, and Sneezing Pads: No  INTERCOURSE Pain with intercourse: NO   PREGNANCY Vaginal deliveries 3 Tearing Yes: maybe a little, no stitches   PROLAPSE Cystocele      OBJECTIVE:   DIAGNOSTIC FINDINGS:    PATIENT SURVEYS:    PFIQ-7   COGNITION:  Overall cognitive status: Within functional limits for tasks assessed     SENSATION:  Light touch: Appears intact   MUSCLE LENGTH: Hamstrings: Right WFL  deg; Left WFL  deg Thomas test:   LUMBAR SPECIAL TESTS:  Active SLR test - core weakness present due to instability of pelvis with ASLR  FUNCTIONAL TESTS:    GAIT:  Comments: WFL                POSTURE: rounded shoulders, increased lumbar lordosis, anterior pelvic tilt, and flexed trunk    PELVIC ALIGNMENT:  LUMBARAROM/PROM  A/PROM A/PROM  eval  Flexion   Extension   Right lateral flexion   Left lateral flexion   Right rotation   Left rotation    (Blank rows = not tested)  LOWER EXTREMITY ROM:  Passive ROM Right eval Left eval  Hip flexion    Hip extension    Hip abduction 4/5 4/5  Hip adduction    Hip internal rotation    Hip external rotation    Knee flexion    Knee extension    Ankle dorsiflexion    Ankle plantarflexion    Ankle inversion    Ankle eversion     (Blank rows = not tested)  LOWER EXTREMITY MMT:  MMT Right eval Left eval  Hip flexion    Hip extension    Hip abduction    Hip adduction    Hip internal rotation  75%  Hip external rotation  75%  Knee flexion    Knee extension    Ankle dorsiflexion    Ankle plantarflexion    Ankle inversion    Ankle eversion  PALPATION:   General  tight lumbar                External Perineal Exam                              Internal Pelvic Floor   Patient confirms identification and  approves PT to assess internal pelvic floor and treatment Yes  PELVIC MMT:   MMT eval  Vaginal 2/5 x 3 reps; hold 2 seconds  Internal Anal Sphincter   External Anal Sphincter   Puborectalis   Diastasis Recti   (Blank rows = not tested)        TONE: Low tone anterior wall  PROLAPSE: Anterior wall  TODAY'S TREATMENT  Date: 04/24/22  Nuero Re-ed: Education and cues for coordination of breathing  Pelvic floor muscle contracting and relaxing at appropriate times during activities Biofeedback - kegel with supine; side lying clam; sit to stand; mini squat -- 15x each  Exercises: Hip flexor stretch Hamstring stretch   PATIENT EDUCATION:  Education details: Access Code: [email or web address] Person educated: Patient Education method: Explanation, Demonstration, Tactile cues, Verbal cues, and Handouts Education comprehension: verbalized understanding and returned demonstration   HOME EXERCISE PROGRAM: Access Code: [email or web address] URL: [email or web address] Date: 04/19/2022 Prepared by: Jari Favre  Exercises - Supine Pelvic Floor Contraction  - 3 x daily - 7 x weekly - 1 sets - 10 reps - 3 sec hold  ASSESSMENT:  CLINICAL IMPRESSION: Today's session focused on biofeedback and adding to pelvic floor strengthening exercises with increasing patient awareness.  Pt did well with progressing exercises. She was cued to not hold her breath and for timing of when to engage the pelvic floor during each exercises.  Pt will benefit from skilled PT to continue to improve core strength.   OBJECTIVE IMPAIRMENTS decreased coordination, decreased endurance, decreased ROM, decreased strength, impaired flexibility, postural dysfunction, and pain.   ACTIVITY LIMITATIONS continence  PARTICIPATION LIMITATIONS: community activity  PERSONAL FACTORS Age, Time since onset of injury/illness/exacerbation, and 1-2 comorbidities: 3 vaginal deliveries, some tearing  are also affecting patient's  functional outcome.   REHAB POTENTIAL: Excellent  CLINICAL DECISION MAKING: Evolving/moderate complexity  EVALUATION COMPLEXITY: Moderate   GOALS: Goals reviewed with patient? Yes  SHORT TERM GOALS: Target date: 05/17/2022  Ind with initial HEP Baseline: Goal status: INITIAL    LONG TERM GOALS: Target date: 07/12/2022   Pt will report 50% less bladder leakage Baseline:  Goal status: INITIAL  2.  Pt will be independent with advanced HEP to maintain improvements made throughout therapy  Baseline:  Goal status: INITIAL  3.  Pt will be able to functional actions such as walking to bathroom or sneezing without leakage  Baseline:  Goal status: INITIAL  4.  Pt will have nocturia decreased to 1/night at most  Baseline:  Goal status: INITIAL    PLAN: PT FREQUENCY: 1x/week  PT DURATION: 12 weeks  PLANNED INTERVENTIONS: Therapeutic exercises, Therapeutic activity, Neuromuscular re-education, Balance training, Gait training, Patient/Family education, Self Care, Joint mobilization, Dry Needling, Electrical stimulation, Cryotherapy, Moist heat, Taping, Biofeedback, Manual therapy, and Re-evaluation  PLAN FOR NEXT SESSION: lumbar mobility, hip and core strength along with kegels   Camillo Flaming Rabecca Birge, PT 04/24/2022, 12:32 PM

## 2022-04-26 ENCOUNTER — Ambulatory Visit (INDEPENDENT_AMBULATORY_CARE_PROVIDER_SITE_OTHER): Payer: No Typology Code available for payment source

## 2022-04-26 DIAGNOSIS — Z0000 Encounter for general adult medical examination without abnormal findings: Secondary | ICD-10-CM | POA: Diagnosis not present

## 2022-04-26 NOTE — Patient Instructions (Signed)
Monica Rogers , Thank you for taking time to come for your Medicare Wellness Visit. I appreciate your ongoing commitment to your health goals. Please review the following plan we discussed and let me know if I can assist you in the future.   These are the goals we discussed:  Goals   None     This is a list of the screening recommended for you and due dates:  Health Maintenance  Topic Date Due   Tetanus Vaccine  Never done   Zoster (Shingles) Vaccine (1 of 2) Never done   Pneumonia Vaccine (1 - PCV) Never done   COVID-19 Vaccine (2 - Pfizer risk series) 10/12/2019   Flu Shot  Never done   Mammogram  09/12/2022   Colon Cancer Screening  12/13/2023   DEXA scan (bone density measurement)  Completed   Hepatitis C Screening: USPSTF Recommendation to screen - Ages 28-79 yo.  Completed   HPV Vaccine  Aged Out    Advanced directives: Please bring a copy of your health care power of attorney and living will to the office at your convenience.  Conditions/risks identified: lose weight   Next appointment: Follow up in one year for your annual wellness visit    Preventive Care 65 Years and Older, Female Preventive care refers to lifestyle choices and visits with your health care provider that can promote health and wellness. What does preventive care include? A yearly physical exam. This is also called an annual well check. Dental exams once or twice a year. Routine eye exams. Ask your health care provider how often you should have your eyes checked. Personal lifestyle choices, including: Daily care of your teeth and gums. Regular physical activity. Eating a healthy diet. Avoiding tobacco and drug use. Limiting alcohol use. Practicing safe sex. Taking low-dose aspirin every day. Taking vitamin and mineral supplements as recommended by your health care provider. What happens during an annual well check? The services and screenings done by your health care provider during your annual well  check will depend on your age, overall health, lifestyle risk factors, and family history of disease. Counseling  Your health care provider may ask you questions about your: Alcohol use. Tobacco use. Drug use. Emotional well-being. Home and relationship well-being. Sexual activity. Eating habits. History of falls. Memory and ability to understand (cognition). Work and work Statistician. Reproductive health. Screening  You may have the following tests or measurements: Height, weight, and BMI. Blood pressure. Lipid and cholesterol levels. These may be checked every 5 years, or more frequently if you are over 9 years old. Skin check. Lung cancer screening. You may have this screening every year starting at age 53 if you have a 30-pack-year history of smoking and currently smoke or have quit within the past 15 years. Fecal occult blood test (FOBT) of the stool. You may have this test every year starting at age 10. Flexible sigmoidoscopy or colonoscopy. You may have a sigmoidoscopy every 5 years or a colonoscopy every 10 years starting at age 38. Hepatitis C blood test. Hepatitis B blood test. Sexually transmitted disease (STD) testing. Diabetes screening. This is done by checking your blood sugar (glucose) after you have not eaten for a while (fasting). You may have this done every 1-3 years. Bone density scan. This is done to screen for osteoporosis. You may have this done starting at age 28. Mammogram. This may be done every 1-2 years. Talk to your health care provider about how often you should have regular  mammograms. Talk with your health care provider about your test results, treatment options, and if necessary, the need for more tests. Vaccines  Your health care provider may recommend certain vaccines, such as: Influenza vaccine. This is recommended every year. Tetanus, diphtheria, and acellular pertussis (Tdap, Td) vaccine. You may need a Td booster every 10 years. Zoster  vaccine. You may need this after age 32. Pneumococcal 13-valent conjugate (PCV13) vaccine. One dose is recommended after age 60. Pneumococcal polysaccharide (PPSV23) vaccine. One dose is recommended after age 70. Talk to your health care provider about which screenings and vaccines you need and how often you need them. This information is not intended to replace advice given to you by your health care provider. Make sure you discuss any questions you have with your health care provider. Document Released: 08/19/2015 Document Revised: 04/11/2016 Document Reviewed: 05/24/2015 Elsevier Interactive Patient Education  2017 [employer] Prevention in the Home Falls can cause injuries. They can happen to people of all ages. There are many things you can do to make your home safe and to help prevent falls. What can I do on the outside of my home? Regularly fix the edges of walkways and driveways and fix any cracks. Remove anything that might make you trip as you walk through a door, such as a raised step or threshold. Trim any bushes or trees on the path to your home. Use bright outdoor lighting. Clear any walking paths of anything that might make someone trip, such as rocks or tools. Regularly check to see if handrails are loose or broken. Make sure that both sides of any steps have handrails. Any raised decks and porches should have guardrails on the edges. Have any leaves, snow, or ice cleared regularly. Use sand or salt on walking paths during winter. Clean up any spills in your garage right away. This includes oil or grease spills. What can I do in the bathroom? Use night lights. Install grab bars by the toilet and in the tub and shower. Do not use towel bars as grab bars. Use non-skid mats or decals in the tub or shower. If you need to sit down in the shower, use a plastic, non-slip stool. Keep the floor dry. Clean up any water that spills on the floor as soon as it happens. Remove  soap buildup in the tub or shower regularly. Attach bath mats securely with double-sided non-slip rug tape. Do not have throw rugs and other things on the floor that can make you trip. What can I do in the bedroom? Use night lights. Make sure that you have a light by your bed that is easy to reach. Do not use any sheets or blankets that are too big for your bed. They should not hang down onto the floor. Have a firm chair that has side arms. You can use this for support while you get dressed. Do not have throw rugs and other things on the floor that can make you trip. What can I do in the kitchen? Clean up any spills right away. Avoid walking on wet floors. Keep items that you use a lot in easy-to-reach places. If you need to reach something above you, use a strong step stool that has a grab bar. Keep electrical cords out of the way. Do not use floor polish or wax that makes floors slippery. If you must use wax, use non-skid floor wax. Do not have throw rugs and other things on the floor that can  make you trip. What can I do with my stairs? Do not leave any items on the stairs. Make sure that there are handrails on both sides of the stairs and use them. Fix handrails that are broken or loose. Make sure that handrails are as long as the stairways. Check any carpeting to make sure that it is firmly attached to the stairs. Fix any carpet that is loose or worn. Avoid having throw rugs at the top or bottom of the stairs. If you do have throw rugs, attach them to the floor with carpet tape. Make sure that you have a light switch at the top of the stairs and the bottom of the stairs. If you do not have them, ask someone to add them for you. What else can I do to help prevent falls? Wear shoes that: Do not have high heels. Have rubber bottoms. Are comfortable and fit you well. Are closed at the toe. Do not wear sandals. If you use a stepladder: Make sure that it is fully opened. Do not climb a  closed stepladder. Make sure that both sides of the stepladder are locked into place. Ask someone to hold it for you, if possible. Clearly mark and make sure that you can see: Any grab bars or handrails. First and last steps. Where the edge of each step is. Use tools that help you move around (mobility aids) if they are needed. These include: Canes. Walkers. Scooters. Crutches. Turn on the lights when you go into a dark area. Replace any light bulbs as soon as they burn out. Set up your furniture so you have a clear path. Avoid moving your furniture around. If any of your floors are uneven, fix them. If there are any pets around you, be aware of where they are. Review your medicines with your doctor. Some medicines can make you feel dizzy. This can increase your chance of falling. Ask your doctor what other things that you can do to help prevent falls. This information is not intended to replace advice given to you by your health care provider. Make sure you discuss any questions you have with your health care provider. Document Released: 05/19/2009 Document Revised: 12/29/2015 Document Reviewed: 08/27/2014 Elsevier Interactive Patient Education  2017 [employer].

## 2022-04-26 NOTE — Progress Notes (Addendum)
Virtual Visit via Telephone Note  I connected with  Monica Rogers on 04/26/22 at 10:30 AM EDT by telephone and verified that I am speaking with the correct person using two identifiers.  Medicare Annual Wellness visit completed telephonically due to Covid-19 pandemic.   Persons participating in this call: This Health Coach and this patient.   Location: Patient: home Provider: office    I discussed the limitations, risks, security and privacy concerns of performing an evaluation and management service by telephone and the availability of in person appointments. The patient expressed understanding and agreed to proceed.  Unable to perform video visit due to video visit attempted and failed and/or patient does not have video capability.   Some vital signs may be absent or patient reported.   Willette Brace, LPN   Subjective:   Monica Rogers is a 71 y.o. female who presents for an Initial Medicare Annual Wellness Visit.  Review of Systems     Cardiac Risk Factors include: advanced age (>26mn, >>11women);dyslipidemia;hypertension;obesity (BMI >30kg/m2)     Objective:    There were no vitals filed for this visit. There is no height or weight on file to calculate BMI.     04/26/2022   10:40 AM 04/19/2022   11:18 AM 09/28/2020    8:28 AM 09/09/2018   12:11 PM  Advanced Directives  Does Patient Have a Medical Advance Directive? Yes Yes No No  Type of [place]amed[place]of [place] will [place] will    Copy of HNogalin Chart? No - copy requested     Would patient like information on creating a medical advance directive?   No - Patient declined     Current Medications (verified) Outpatient Encounter Medications as of 04/26/2022  Medication Sig   AMBULATORY NON FORMULARY MEDICATION Bone Up 1 tablet in the morning and 2 in the afternoon   ARMOUR THYROID 120 MG tablet Take 1 tablet (120 mg total) by mouth  daily.   Boswellia Serrata (BOSWELLIA PO) Take 2 tablets by mouth 2 (two) times daily.   butalbital-acetaminophen-caffeine (FIORICET) 50-325-40 MG tablet Take 1 tablet by mouth every 6 (six) hours as needed for headache.   CALCIUM PO Take 1 tablet by mouth daily.   Carboxymethylcellulose Sodium (EYE DROPS OP) Apply 2 drops to eye 2 (two) times daily as needed (dry eyes).   CHONDROITIN SULFATE PO Take 1 tablet by mouth daily.   Coenzyme Q10 (CO Q-10 PO) Take 1 tablet by mouth in the morning and at bedtime.   DANDELION ROOT PO Take 1 tablet by mouth 2 (two) times daily.   dexamethasone (DECADRON) 0.1 % ophthalmic solution SMARTSIG:2 Drop(s) In Ear(s)   dicyclomine (BENTYL) 10 MG capsule TAKE 1 CAPSULE (10 MG TOTAL) BY MOUTH EVERY 8 (EIGHT) HOURS AS NEEDED FOR SPASMS.   Digestive Enzymes (ENZYME DIGEST) CAPS Take 2 capsules by mouth daily.   estradiol (CLIMARA - DOSED IN MG/24 HR) 0.05 mg/24hr patch APPLY 1 PATCH ONCE EVERY WEEK   estradiol (ESTRACE) 0.1 MG/GM vaginal cream Place 0.5 g vaginally 2 (two) times a week. Place 0.5g nightly for two weeks then twice a week after   fluconazole (DIFLUCAN) 150 MG tablet Take first tablet on day 4 of ciprofloxacin.  Take second tablet 3 days after first tablet.  Take third tablet 3 days after second tablet if needed.   Fluocinolone Acetonide 0.01 % OIL INSTILL 4 DROPS NIGHTLY INTO BOTH EARS FOR 14  DAYS. THEN STOP AND USE AS NEEDED FOR ITCHING.   fluticasone (FLONASE) 50 MCG/ACT nasal spray Place 2 sprays into both nostrils daily.   Glucosamine-Chondroitin 500-400 MG CAPS Take 1 tablet by mouth 3 (three) times daily.   Magnesium 110 MG CAPS Take 2 capsules by mouth 2 (two) times daily.   olmesartan (BENICAR) 20 MG tablet Take 1 tablet (20 mg total) by mouth daily.   Omega-3 Fatty Acids (FISH OIL PO) Take 1 tablet by mouth 2 (two) times daily.   OVER THE COUNTER MEDICATION Take 1 tablet by mouth daily. Bone up bone supplement   phenazopyridine (PYRIDIUM) 200  MG tablet Take 1 tablet (200 mg total) by mouth 3 (three) times daily as needed for pain.   progesterone (PROMETRIUM) 200 MG capsule Take 200 mg by mouth daily.   QUERCETIN PO Take 2 tablets by mouth 3 (three) times daily.   rosuvastatin (CRESTOR) 10 MG tablet Take 10 mg by mouth daily.   Thiamine HCl (VITAMIN B-1 PO) Take 1 tablet by mouth daily.   VITAMIN D, CHOLECALCIFEROL, PO Take 10,000 Units by mouth daily.    Zinc Sulfate 66 MG TABS Take 132 mg by mouth daily.   No facility-administered encounter medications on file as of 04/26/2022.    Allergies (verified) Dairycare [lactase-lactobacillus], June grass pollen standardized [timothy grass pollen allergen], and Wheat bran   History: Past Medical History:  Diagnosis Date   Allergy    Anemia    Arthritis    Blood transfusion without reported diagnosis    Chronic headaches    Diverticulosis    Hyperlipidemia    Hypertension    Hypothyroidism    IBS (irritable bowel syndrome)    Prediabetes    Recurrent UTI    Past Surgical History:  Procedure Laterality Date   BREAST BIOPSY Right 2019   IR ANGIOGRAM SELECTIVE EACH ADDITIONAL VESSEL  09/28/2020   IR ANGIOGRAM SELECTIVE EACH ADDITIONAL VESSEL  09/28/2020   IR ANGIOGRAM SELECTIVE EACH ADDITIONAL VESSEL  09/28/2020   IR ANGIOGRAM SELECTIVE EACH ADDITIONAL VESSEL  09/28/2020   IR ANGIOGRAM VISCERAL SELECTIVE  09/28/2020   IR ANGIOGRAM VISCERAL SELECTIVE  09/28/2020   IR EMBO ART  VEN HEMORR LYMPH EXTRAV  INC GUIDE ROADMAPPING  09/28/2020   IR US GUIDE VASC ACCESS RIGHT  09/28/2020   TONSILLECTOMY     Family History  Problem Relation Age of Onset   Hearing loss Mother    Miscarriages / [place] Mother    Alcohol abuse Father    Hearing loss Father    Lung cancer Father        smoker   Hypertension Father    Diabetes Brother    Cancer Maternal Grandmother    Diabetes Paternal Grandmother    Social History   Socioeconomic History   Marital status: Married     Spouse name: Not on file   Number of children: 3   Years of education: Not on file   Highest education level: Not on file  Occupational History   Occupation: Artist  Tobacco Use   Smoking status: Never   Smokeless tobacco: Never  Vaping Use   Vaping Use: Never used  Substance and Sexual Activity   Alcohol use: No   Drug use: No   Sexual activity: Not Currently    Birth control/protection: None  Other Topics Concern   Not on file  Social History Narrative   1 grand      Retired Pharmacist, hospital.   Artist now  Social Determinants of Health   Financial Resource Strain: Low Risk  (04/26/2022)   Overall Financial Resource Strain (CARDIA)    Difficulty of Paying Living Expenses: Not hard at all  Food Insecurity: No Food Insecurity (04/26/2022)   Hunger Vital Sign    Worried About Running Out of Food in the Last Year: Never true    Ran Out of Food in the Last Year: Never true  Transportation Needs: No Transportation Needs (04/26/2022)   PRAPARE - Hydrologist (Medical): No    Lack of Transportation (Non-Medical): No  Physical Activity: Sufficiently Active (04/26/2022)   Exercise Vital Sign    Days of Exercise per Week: 5 days    Minutes of Exercise per Session: 30 min  Stress: No Stress Concern Present (04/26/2022)   [place]    Feeling of Stress : Not at all  Social Connections: [place] (04/26/2022)   Social Connection and Isolation Panel [NHANES]    Frequency of Communication with Friends and Family: More than three times a week    Frequency of Social Gatherings with Friends and Family: More than three times a week    Attends Religious Services: More than 4 times per year    Active Member of [employer] or Organizations: Yes    Attends Music therapist: More than 4 times per year    Marital Status: Married    Tobacco Counseling Counseling given: Not  Answered   Clinical Intake:  Pre-visit preparation completed: Yes  Pain : No/denies pain     Nutritional Risks: None Diabetes: No  How often do you need to have someone help you when you read instructions, pamphlets, or other written materials from your doctor or pharmacy?: 1 - Never  Diabetic?no  Interpreter Needed?: No  Information entered by :: Charlott Rakes, LPN   Activities of Daily Living    04/26/2022   10:41 AM  In your present state of health, do you have any difficulty performing the following activities:  Hearing? 0  Vision? 0  Difficulty concentrating or making decisions? 0  Walking or climbing stairs? 0  Dressing or bathing? 0  Doing errands, shopping? 0  Preparing Food and eating ? N  Using the Toilet? N  In the past six months, have you accidently leaked urine? Y  Comment at times  Do you have problems with loss of bowel control? N  Managing your Medications? N  Managing your Finances? N  Housekeeping or managing your Housekeeping? N    Patient Care Team: Tawnya Crook, MD as PCP - General (Family Medicine)  Indicate any recent Medical Services you may have received from other than [facility] providers in the past year (date may be approximate).     Assessment:   This is a routine wellness examination for Monica Rogers.  Hearing/Vision screen Hearing Screening - Comments:: Pt denies any hearing issues  Vision Screening - Comments:: Pt follows up with eye center [place]   Dietary issues and exercise activities discussed: Current Exercise Habits: Home exercise routine, Type of exercise: walking, Time (Minutes): 30, Frequency (Times/Week): 5, Weekly Exercise (Minutes/Week): 150   Goals Addressed             This Visit's Progress    Patient Stated       Lose weight        Depression Screen    04/26/2022   10:38 AM 02/22/2022   11:20 AM  PHQ 2/9 Scores  PHQ - 2 Score 0 0    Fall Risk    04/26/2022   10:41 AM 02/22/2022   11:02 AM   Fall Risk   Falls in the past year? 0 0  Number falls in past yr: 0 0  Injury with Fall? 0 0  Risk for fall due to : No Fall Risks;Impaired vision;Impaired balance/gait No Fall Risks  Follow up Falls prevention discussed     FALL RISK PREVENTION PERTAINING TO THE HOME:  Any stairs in or around the home? Yes  If so, are there any without handrails? No  Home free of loose throw rugs in walkways, pet beds, electrical cords, etc? Yes  Adequate lighting in your home to reduce risk of falls? Yes   ASSISTIVE DEVICES UTILIZED TO PREVENT FALLS:  Life alert? No  Use of a cane, walker or w/c? No  Grab bars in the bathroom? No  Shower chair or bench in shower? No  Elevated toilet seat or a handicapped toilet? No   TIMED UP AND GO:  Was the test performed? No .   Cognitive Function:        04/26/2022   10:43 AM  6CIT Screen  What Year? 0 points  What month? 0 points  What time? 0 points  Count back from 20 0 points  Months in reverse 0 points  Repeat phrase 2 points  Total Score 2 points    Immunizations Immunization History  Administered Date(s) Administered   PFIZER(Purple Top)SARS-COV-2 Vaccination 09/21/2019    TDAP status: Due, Education has been provided regarding the importance of this vaccine. Advised may receive this vaccine at local pharmacy or Health Dept. Aware to provide a copy of the vaccination record if obtained from local pharmacy or Health Dept. Verbalized acceptance and understanding.  Flu Vaccine status: Declined, Education has been provided regarding the importance of this vaccine but patient still declined. Advised may receive this vaccine at local pharmacy or Health Dept. Aware to provide a copy of the vaccination record if obtained from local pharmacy or Health Dept. Verbalized acceptance and understanding.  Pneumococcal vaccine status: Declined,  Education has been provided regarding the importance of this vaccine but patient still declined. Advised  may receive this vaccine at local pharmacy or Health Dept. Aware to provide a copy of the vaccination record if obtained from local pharmacy or Health Dept. Verbalized acceptance and understanding.   Covid-19 vaccine status: Completed vaccines  Qualifies for Shingles Vaccine? Yes   Zostavax completed No   Shingrix Completed?: No.    Education has been provided regarding the importance of this vaccine. Patient has been advised to call insurance company to determine out of pocket expense if they have not yet received this vaccine. Advised may also receive vaccine at local pharmacy or Health Dept. Verbalized acceptance and understanding.  Screening Tests Health Maintenance  Topic Date Due   TETANUS/TDAP  Never done   Zoster Vaccines- Shingrix (1 of 2) Never done   Pneumonia Vaccine 49+ Years old (1 - PCV) Never done   COVID-19 Vaccine (2 - Pfizer risk series) 10/12/2019   INFLUENZA VACCINE  Never done   MAMMOGRAM  09/12/2022   COLONOSCOPY (Pts 45-69yr Insurance coverage will need to be confirmed)  12/13/2023   DEXA SCAN  Completed   Hepatitis C Screening  Completed   HPV VACCINES  Aged Out    Health Maintenance  Health Maintenance Due  Topic Date Due   TETANUS/TDAP  Never done  Zoster Vaccines- Shingrix (1 of 2) Never done   Pneumonia Vaccine 73+ Years old (1 - PCV) Never done   COVID-19 Vaccine (2 - Pfizer risk series) 10/12/2019   INFLUENZA VACCINE  Never done    Colorectal cancer screening: Type of screening: Colonoscopy. Completed 12/12/20. Repeat every 3 years  Mammogram status: Completed 09/12/20. Repeat every year  Bone density 12/20/20   Additional Screening:  Hepatitis C Screening:  Completed 02/22/22  Vision Screening: Recommended annual ophthalmology exams for early detection of glaucoma and other disorders of the eye. Is the patient up to date with their annual eye exam?  Yes  Who is the provider or what is the name of the office in which the patient attends annual  eye exams? [place] provider [place]  If pt is not established with a provider, would they like to be referred to a provider to establish care? No .   Dental Screening: Recommended annual dental exams for proper oral hygiene  Community Resource Referral / Chronic Care Management: CRR required this visit?  No   CCM required this visit?  No      Plan:     I have personally reviewed and noted the following in the patient's chart:   Medical and social history Use of alcohol, tobacco or illicit drugs  Current medications and supplements including opioid prescriptions. Patient is currently taking opioid prescriptions. Information provided to patient regarding non-opioid alternatives. Patient advised to discuss non-opioid treatment plan with their provider. Functional ability and status Nutritional status Physical activity Advanced directives List of other physicians Hospitalizations, surgeries, and ER visits in previous 12 months Vitals Screenings to include cognitive, depression, and falls Referrals and appointments  In addition, I have reviewed and discussed with patient certain preventive protocols, quality metrics, and best practice recommendations. A written personalized care plan for preventive services as well as general preventive health recommendations were provided to patient.     Willette Brace, LPN   5/36/1443   Nurse Notes: none

## 2022-04-30 ENCOUNTER — Encounter: Payer: Self-pay | Admitting: *Deleted

## 2022-05-02 ENCOUNTER — Encounter: Payer: Self-pay | Admitting: Physical Therapy

## 2022-05-02 ENCOUNTER — Ambulatory Visit: Payer: No Typology Code available for payment source | Admitting: Physical Therapy

## 2022-05-02 DIAGNOSIS — R279 Unspecified lack of coordination: Secondary | ICD-10-CM

## 2022-05-02 DIAGNOSIS — N8110 Cystocele, unspecified: Secondary | ICD-10-CM | POA: Diagnosis not present

## 2022-05-02 DIAGNOSIS — M6281 Muscle weakness (generalized): Secondary | ICD-10-CM

## 2022-05-02 NOTE — Therapy (Signed)
OUTPATIENT PHYSICAL THERAPY FEMALE PELVIC EVALUATION   Patient Name: Monica Rogers MRN: [id number] DOB:04-23-1951, 71 y.o., female Today's Date: 05/02/2022   PT End of Session - 05/02/22 1025     Visit Number 3    Date for PT Re-Evaluation 07/12/22    Authorization Type [employer]    PT Start Time 1021    PT Stop Time 1059    PT Time Calculation (min) 38 min    Activity Tolerance Patient tolerated treatment well    Behavior During Therapy WFL for tasks assessed/performed              Past Medical History:  Diagnosis Date   Allergy    Anemia    Arthritis    Blood transfusion without reported diagnosis    Chronic headaches    Diverticulosis    Hyperlipidemia    Hypertension    Hypothyroidism    IBS (irritable bowel syndrome)    Prediabetes    Recurrent UTI    Past Surgical History:  Procedure Laterality Date   BREAST BIOPSY Right 2019   IR ANGIOGRAM SELECTIVE EACH ADDITIONAL VESSEL  09/28/2020   IR ANGIOGRAM SELECTIVE EACH ADDITIONAL VESSEL  09/28/2020   IR ANGIOGRAM SELECTIVE EACH ADDITIONAL VESSEL  09/28/2020   IR ANGIOGRAM SELECTIVE EACH ADDITIONAL VESSEL  09/28/2020   IR ANGIOGRAM VISCERAL SELECTIVE  09/28/2020   IR ANGIOGRAM VISCERAL SELECTIVE  09/28/2020   IR EMBO ART  VEN HEMORR LYMPH EXTRAV  INC GUIDE ROADMAPPING  09/28/2020   IR US GUIDE VASC ACCESS RIGHT  09/28/2020   TONSILLECTOMY     Patient Active Problem List   Diagnosis Date Noted   Prediabetes 02/22/2022   Lower GI bleed 09/29/2020   Hypothyroidism 09/29/2020   Acute blood loss anemia 09/29/2020   Sigmoid diverticulitis 09/28/2020   Hypertension    KNEE PAIN, RIGHT 11/18/2009   ANKLE PAIN, RIGHT 11/18/2009   SINUS TARSI SYNDROME, RIGHT FOOT 11/18/2009   PLANTAR FASCIITIS, LEFT 11/18/2009   HYPERTHYROIDISM 04/23/2008   HLD (hyperlipidemia) 04/23/2008   OVERWEIGHT 04/23/2008   ANXIETY 04/23/2008   HEADACHE, CHRONIC 04/23/2008   NONSPECIFIC ABNORMAL RESULTS LIVR FUNCTION STUDY  04/23/2008   ANEMIA, DEFICIENCY, HX OF 04/23/2008    PCP: Tawnya Crook, MD  REFERRING PROVIDER: Jaquita Folds, MD  REFERRING DIAG:  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  It is the worst when getting up in the morning and have leakage a few days/week  PAIN:  Are you having pain? No   Eval SUBJECTIVE STATEMENT: I was sent here due to chronic UTIs.  I am having leakage in the morning or getting up at night can't stop the leakage and have to change the underwear.  During the day when walking to the bathroom.  I have leakage 3-4x per week.  I have UTI infections a couple x/month since this spring which is more than it has ever been.  Pt has pain and cramping when urinating during the UTI Fluid intake: Yes: 5 glasses of water and black tea     PAIN:  Are you having pain? No  PRECAUTIONS: None  WEIGHT BEARING RESTRICTIONS No  FALLS:  Has patient fallen in last 6 months? No  LIVING ENVIRONMENT: Lives with: lives with their family and lives with their spouse Lives in: House/apartment   OCCUPATION: retired Recreation: walking 5 day/week  PLOF: Independent  PATIENT GOALS stop having UTIs and decrease leakage  PERTINENT HISTORY:  Recurrent UTI, menopause Sexual abuse: No  BOWEL MOVEMENT Pain with bowel movement: No Type of bowel movement:Type (Bristol Stool Scale) normal, Frequency regular, and Strain No Fully empty rectum: Yes:     URINATION Pain  with urination: No Fully empty bladder: Yes:   Stream: Weak Urgency: Yes: and not a lot comes out Frequency: every couple days; nocturia 2/night Leakage: Urge to void, Walking to the bathroom, Coughing, and Sneezing Pads: No  INTERCOURSE Pain with intercourse: NO   PREGNANCY Vaginal deliveries 3 Tearing Yes: maybe a little, no stitches   PROLAPSE Cystocele      OBJECTIVE:   DIAGNOSTIC FINDINGS:    PATIENT SURVEYS:    PFIQ-7   COGNITION:  Overall cognitive status: Within functional limits for tasks assessed     SENSATION:  Light touch: Appears intact   MUSCLE LENGTH: Hamstrings: Right WFL  deg; Left WFL  deg Thomas test:   LUMBAR SPECIAL TESTS:  Active SLR test - core weakness present due to instability of pelvis with ASLR  FUNCTIONAL TESTS:    GAIT:  Comments: WFL                POSTURE: rounded shoulders, increased lumbar lordosis, anterior pelvic tilt, and flexed trunk    PELVIC ALIGNMENT:  LUMBARAROM/PROM  A/PROM A/PROM  eval  Flexion   Extension   Right lateral flexion   Left lateral flexion   Right rotation   Left rotation    (Blank rows = not tested)  LOWER EXTREMITY ROM:  Passive ROM Right eval Left eval  Hip flexion    Hip extension    Hip abduction 4/5 4/5  Hip adduction    Hip internal rotation    Hip external rotation    Knee flexion    Knee extension    Ankle dorsiflexion    Ankle plantarflexion    Ankle inversion    Ankle eversion     (Blank rows = not tested)  LOWER EXTREMITY MMT:  MMT Right eval Left eval  Hip flexion    Hip extension    Hip abduction    Hip adduction    Hip internal rotation  75%  Hip external rotation  75%  Knee flexion    Knee extension    Ankle dorsiflexion    Ankle plantarflexion    Ankle inversion  Ankle eversion      PALPATION:   General  tight lumbar                External Perineal Exam                              Internal Pelvic Floor   Patient confirms  identification and approves PT to assess internal pelvic floor and treatment Yes  PELVIC MMT:   MMT eval  Vaginal 2/5 x 3 reps; hold 2 seconds  Internal Anal Sphincter   External Anal Sphincter   Puborectalis   Diastasis Recti   (Blank rows = not tested)        TONE: Low tone anterior wall  PROLAPSE: Anterior wall  TODAY'S TREATMENT  Date: 05/02/22  [place] Re-ed: Education and cues for coordination of breathing  Pelvic floor muscle contracting and relaxing at appropriate times during activities  Exercises: Lumbar rotation stretch Thoracic sidelying Knee to chest Hip abduction supine Clamshells blue loop Seated ball squeeze with LE kick  Date: 04/24/22  [place] Re-ed: Education and cues for coordination of breathing  Pelvic floor muscle contracting and relaxing at appropriate times during activities Biofeedback - kegel with supine; side lying clam; sit to stand; mini squat -- 15x each  Exercises: Hip flexor stretch Hamstring stretch   PATIENT EDUCATION:  Education details: Access Code: [email or web address] Person educated: Patient Education method: Explanation, Demonstration, Tactile cues, Verbal cues, and Handouts Education comprehension: verbalized understanding and returned demonstration   HOME EXERCISE PROGRAM: Access Code: [email or web address] URL: [email or web address] Date: 04/19/2022 Prepared by: Jari Favre  Exercises - Supine Pelvic Floor Contraction  - 3 x daily - 7 x weekly - 1 sets - 10 reps - 3 sec hold  ASSESSMENT:  CLINICAL IMPRESSION: Today's session focused on coordinating pelvic floor with functional movements and varying positions.  Pt was given updates and progression of exercises. Pt will benefit from skilled to progress to all functional activities.   OBJECTIVE IMPAIRMENTS decreased coordination, decreased endurance, decreased ROM, decreased strength, impaired flexibility, postural dysfunction, and pain.   ACTIVITY LIMITATIONS  continence  PARTICIPATION LIMITATIONS: community activity  PERSONAL FACTORS Age, Time since onset of injury/illness/exacerbation, and 1-2 comorbidities: 3 vaginal deliveries, some tearing  are also affecting patient's functional outcome.   REHAB POTENTIAL: Excellent  CLINICAL DECISION MAKING: Evolving/moderate complexity  EVALUATION COMPLEXITY: Moderate   GOALS: Goals reviewed with patient? Yes  SHORT TERM GOALS: Target date: 05/17/2022  Ind with initial HEP Baseline: Goal status: MET    LONG TERM GOALS: Target date: 07/12/2022  Updated 05/02/22  Pt will report 50% less bladder leakage Baseline:  Goal status: IN PROGRESS  2.  Pt will be independent with advanced HEP to maintain improvements made throughout therapy  Baseline:  Goal status: IN PROGRESS  3.  Pt will be able to functional actions such as walking to bathroom or sneezing without leakage  Baseline:  Goal status: IN PROGRESS  4.  Pt will have nocturia decreased to 1/night at most  Baseline:  Goal status: IN PROGRESS    PLAN: PT FREQUENCY: 1x/week  PT DURATION: 12 weeks  PLANNED INTERVENTIONS: Therapeutic exercises, Therapeutic activity, Neuromuscular re-education, Balance training, Gait training, Patient/Family education, Self Care, Joint mobilization, Dry Needling, Electrical stimulation, Cryotherapy, Moist heat, Taping, Biofeedback, Manual therapy, and Re-evaluation  PLAN FOR NEXT SESSION: lumbar mobility, hip and core strength along with kegels   Jakki L Tatyanna Cronk, PT 05/02/2022, 10:30 AM

## 2022-05-10 ENCOUNTER — Ambulatory Visit: Payer: No Typology Code available for payment source | Admitting: Physical Therapy

## 2022-05-17 ENCOUNTER — Ambulatory Visit
Payer: No Typology Code available for payment source | Attending: Obstetrics and Gynecology | Admitting: Physical Therapy

## 2022-05-17 ENCOUNTER — Encounter: Payer: Self-pay | Admitting: Physical Therapy

## 2022-05-17 DIAGNOSIS — R279 Unspecified lack of coordination: Secondary | ICD-10-CM | POA: Diagnosis not present

## 2022-05-17 DIAGNOSIS — M6281 Muscle weakness (generalized): Secondary | ICD-10-CM | POA: Insufficient documentation

## 2022-05-17 NOTE — Therapy (Signed)
OUTPATIENT PHYSICAL THERAPY FEMALE PELVIC EVALUATION   Patient Name: Monica Rogers MRN: [id number] DOB:1951/01/01, 71 y.o., female Today's Date: 05/17/2022   PT End of Session - 05/17/22 1059     Visit Number 4    Date for PT Re-Evaluation 07/12/22    Authorization Type [employer]    PT Start Time 1059    PT Stop Time 1146    PT Time Calculation (min) 47 min    Activity Tolerance Patient tolerated treatment well    Behavior During Therapy WFL for tasks assessed/performed               Past Medical History:  Diagnosis Date   Allergy    Anemia    Arthritis    Blood transfusion without reported diagnosis    Chronic headaches    Diverticulosis    Hyperlipidemia    Hypertension    Hypothyroidism    IBS (irritable bowel syndrome)    Prediabetes    Recurrent UTI    Past Surgical History:  Procedure Laterality Date   BREAST BIOPSY Right 2019   IR ANGIOGRAM SELECTIVE EACH ADDITIONAL VESSEL  09/28/2020   IR ANGIOGRAM SELECTIVE EACH ADDITIONAL VESSEL  09/28/2020   IR ANGIOGRAM SELECTIVE EACH ADDITIONAL VESSEL  09/28/2020   IR ANGIOGRAM SELECTIVE EACH ADDITIONAL VESSEL  09/28/2020   IR ANGIOGRAM VISCERAL SELECTIVE  09/28/2020   IR ANGIOGRAM VISCERAL SELECTIVE  09/28/2020   IR EMBO ART  VEN HEMORR LYMPH EXTRAV  INC GUIDE ROADMAPPING  09/28/2020   IR US GUIDE VASC ACCESS RIGHT  09/28/2020   TONSILLECTOMY     Patient Active Problem List   Diagnosis Date Noted   Prediabetes 02/22/2022   Lower GI bleed 09/29/2020   Hypothyroidism 09/29/2020   Acute blood loss anemia 09/29/2020   Sigmoid diverticulitis 09/28/2020   Hypertension    KNEE PAIN, RIGHT 11/18/2009   ANKLE PAIN, RIGHT 11/18/2009   SINUS TARSI SYNDROME, RIGHT FOOT 11/18/2009   PLANTAR FASCIITIS, LEFT 11/18/2009   HYPERTHYROIDISM 04/23/2008   HLD (hyperlipidemia) 04/23/2008   OVERWEIGHT 04/23/2008   ANXIETY 04/23/2008   HEADACHE, CHRONIC 04/23/2008   NONSPECIFIC ABNORMAL RESULTS LIVR FUNCTION STUDY  04/23/2008   ANEMIA, DEFICIENCY, HX OF 04/23/2008    PCP: Tawnya Crook, MD  REFERRING PROVIDER: Jaquita Folds, MD  REFERRING DIAG:  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  It is the worst when getting up in the morning and have leakage a few days/week  PAIN:  Are you having pain? No   Eval SUBJECTIVE STATEMENT: I was sent here due to chronic UTIs.  I am having leakage in the morning or getting up at night can't stop the leakage and have to change the underwear.  During the day when walking to the bathroom.  I have leakage 3-4x per week.  I have UTI infections a couple x/month since this spring which is more than it has ever been.  Pt has pain and cramping when urinating during the UTI Fluid intake: Yes: 5 glasses of water and black tea     PAIN:  Are you having pain? No  PRECAUTIONS: None  WEIGHT BEARING RESTRICTIONS No  FALLS:  Has patient fallen in last 6 months? No  LIVING ENVIRONMENT: Lives with: lives with their family and lives with their spouse Lives in: House/apartment   OCCUPATION: retired Recreation: walking 5 day/week  PLOF: Independent  PATIENT GOALS stop having UTIs and decrease leakage  PERTINENT HISTORY:  Recurrent UTI, menopause Sexual abuse: No  BOWEL MOVEMENT Pain with bowel movement: No Type of bowel movement:Type (Bristol Stool Scale) normal, Frequency regular, and Strain No Fully empty rectum: Yes:     URINATION Pain  with urination: No Fully empty bladder: Yes:   Stream: Weak Urgency: Yes: and not a lot comes out Frequency: every couple days; nocturia 2/night Leakage: Urge to void, Walking to the bathroom, Coughing, and Sneezing Pads: No  INTERCOURSE Pain with intercourse: NO   PREGNANCY Vaginal deliveries 3 Tearing Yes: maybe a little, no stitches   PROLAPSE Cystocele      OBJECTIVE:   DIAGNOSTIC FINDINGS:    PATIENT SURVEYS:    PFIQ-7   COGNITION:  Overall cognitive status: Within functional limits for tasks assessed     SENSATION:  Light touch: Appears intact   MUSCLE LENGTH: Hamstrings: Right WFL  deg; Left WFL  deg Thomas test:   LUMBAR SPECIAL TESTS:  Active SLR test - core weakness present due to instability of pelvis with ASLR  FUNCTIONAL TESTS:    GAIT:  Comments: WFL                POSTURE: rounded shoulders, increased lumbar lordosis, anterior pelvic tilt, and flexed trunk    PELVIC ALIGNMENT:  LUMBARAROM/PROM  A/PROM A/PROM  eval  Flexion   Extension   Right lateral flexion   Left lateral flexion   Right rotation   Left rotation    (Blank rows = not tested)  LOWER EXTREMITY ROM:  Passive ROM Right eval Left eval  Hip flexion    Hip extension    Hip abduction 4/5 4/5  Hip adduction    Hip internal rotation    Hip external rotation    Knee flexion    Knee extension    Ankle dorsiflexion    Ankle plantarflexion    Ankle inversion    Ankle eversion     (Blank rows = not tested)  LOWER EXTREMITY MMT:  MMT Right eval Left eval  Hip flexion    Hip extension    Hip abduction    Hip adduction    Hip internal rotation  75%  Hip external rotation  75%  Knee flexion    Knee extension    Ankle dorsiflexion    Ankle plantarflexion    Ankle inversion  Ankle eversion      PALPATION:   General  tight lumbar                External Perineal Exam                              Internal Pelvic Floor   Patient confirms  identification and approves PT to assess internal pelvic floor and treatment Yes  PELVIC MMT:   MMT eval  Vaginal 2/5 x 3 reps; hold 2 seconds  Internal Anal Sphincter   External Anal Sphincter   Puborectalis   Diastasis Recti   (Blank rows = not tested)        TONE: Low tone anterior wall  PROLAPSE: Anterior wall  TODAY'S TREATMENT  Date: 05/17/22  Nuero Re-ed: Education and cues for coordination of breathing  Pelvic floor muscle contracting and relaxing at appropriate times during activities  Exercises: Staggered stance: 10lb diagonals with kegel Staggered stance row - 10lb - 12 x bil Side step 10 lb - 10x bil Seated ball squeeze with  Squat holding 5lb 2 x 10 Side step green loop around feet - 10x Hip flexor stretch - vibration plate Hamstring stretch - vibration plate  Date: 8/58/85  Nuero Re-ed: Education and cues for coordination of breathing  Pelvic floor muscle contracting and relaxing at appropriate times during activities  Exercises: Lumbar rotation stretch Thoracic sidelying Knee to chest Hip abduction supine Clamshells blue loop Seated ball squeeze with LE kick  Date: 04/24/22  Nuero Re-ed: Education and cues for coordination of breathing  Pelvic floor muscle contracting and relaxing at appropriate times during activities Biofeedback - kegel with supine; side lying clam; sit to stand; mini squat -- 15x each  Exercises: Hip flexor stretch Hamstring stretch   PATIENT EDUCATION:  Education details: Access Code: [email or web address] Person educated: Patient Education method: Explanation, Demonstration, Tactile cues, Verbal cues, and Handouts Education comprehension: verbalized understanding and returned demonstration   HOME EXERCISE PROGRAM: Access Code: [email or web address] URL: [email or web address] Date: 04/19/2022 Prepared by: Jari Favre  Exercises - Supine Pelvic Floor Contraction  - 3 x daily - 7 x weekly - 1 sets - 10 reps - 3  sec hold  ASSESSMENT:  CLINICAL IMPRESSION: Today's session focused on coordinating pelvic floor with functional movements and varying positions.  Was able to progress difficulty of exercises.  She reports less nocturia and less leakage overall.  Still has a hard time with sneezing.  Pt will benefit from skilled to progress to all functional activities.   OBJECTIVE IMPAIRMENTS decreased coordination, decreased endurance, decreased ROM, decreased strength, impaired flexibility, postural dysfunction, and pain.   ACTIVITY LIMITATIONS continence  PARTICIPATION LIMITATIONS: community activity  PERSONAL FACTORS Age, Time since onset of injury/illness/exacerbation, and 1-2 comorbidities: 3 vaginal deliveries, some tearing  are also affecting patient's functional outcome.   REHAB POTENTIAL: Excellent  CLINICAL DECISION MAKING: Evolving/moderate complexity  EVALUATION COMPLEXITY: Moderate   GOALS: Goals reviewed with patient? Yes  SHORT TERM GOALS: Target date: 05/17/2022  Ind with initial HEP Baseline: Goal status: MET    LONG TERM GOALS: Target date: 07/12/2022  Updated 05/02/22  Pt will report 50% less bladder leakage Baseline: better at night - only happened 2x; sneezing is still hard Goal status: IN PROGRESS  2.  Pt will be independent with advanced HEP to maintain improvements made throughout therapy  Baseline:  Goal status: IN PROGRESS  3.  Pt will be able to functional  actions such as walking to bathroom or sneezing without leakage  Baseline: sneezing is the worst Goal status: IN PROGRESS  4.  Pt will have nocturia decreased to 1/night at most  Baseline: 1-2/night, better than before Goal status: IN PROGRESS    PLAN: PT FREQUENCY: 1x/week  PT DURATION: 12 weeks  PLANNED INTERVENTIONS: Therapeutic exercises, Therapeutic activity, Neuromuscular re-education, Balance training, Gait training, Patient/Family education, Self Care, Joint mobilization, Dry Needling,  Electrical stimulation, Cryotherapy, Moist heat, Taping, Biofeedback, Manual therapy, and Re-evaluation  PLAN FOR NEXT SESSION: lumbar mobility, hip and core strength along with kegels   Camillo Flaming Rickia [place], PT 05/17/2022, 11:46 AM

## 2022-05-24 ENCOUNTER — Ambulatory Visit: Payer: No Typology Code available for payment source | Admitting: Physical Therapy

## 2022-05-24 NOTE — Therapy (Deleted)
OUTPATIENT PHYSICAL THERAPY FEMALE PELVIC EVALUATION   Patient Name: Monica Rogers MRN: [id number] DOB:24-Nov-1950, 71 y.o., female Today's Date: 05/24/2022       Past Medical History:  Diagnosis Date   Allergy    Anemia    Arthritis    Blood transfusion without reported diagnosis    Chronic headaches    Diverticulosis    Hyperlipidemia    Hypertension    Hypothyroidism    IBS (irritable bowel syndrome)    Prediabetes    Recurrent UTI    Past Surgical History:  Procedure Laterality Date   BREAST BIOPSY Right 2019   IR ANGIOGRAM SELECTIVE EACH ADDITIONAL VESSEL  09/28/2020   IR ANGIOGRAM SELECTIVE EACH ADDITIONAL VESSEL  09/28/2020   IR ANGIOGRAM SELECTIVE EACH ADDITIONAL VESSEL  09/28/2020   IR ANGIOGRAM SELECTIVE EACH ADDITIONAL VESSEL  09/28/2020   IR ANGIOGRAM VISCERAL SELECTIVE  09/28/2020   IR ANGIOGRAM VISCERAL SELECTIVE  09/28/2020   IR EMBO ART  VEN HEMORR LYMPH EXTRAV  INC GUIDE ROADMAPPING  09/28/2020   IR US GUIDE VASC ACCESS RIGHT  09/28/2020   TONSILLECTOMY     Patient Active Problem List   Diagnosis Date Noted   Prediabetes 02/22/2022   Lower GI bleed 09/29/2020   Hypothyroidism 09/29/2020   Acute blood loss anemia 09/29/2020   Sigmoid diverticulitis 09/28/2020   Hypertension    KNEE PAIN, RIGHT 11/18/2009   ANKLE PAIN, RIGHT 11/18/2009   SINUS TARSI SYNDROME, 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  Rationale for Evaluation and  Treatment Rehabilitation  ONSET DATE: past 5 month UTIs have doubled in frequency  SUBJECTIVE:                                                                                                                                                                                          I think it is helping to be more aware.  It is the worst when getting up in the morning and have leakage a few days/week  PAIN:  Are you having pain? No   Eval SUBJECTIVE STATEMENT: I was sent here due to chronic UTIs.  I am having  leakage in the morning or getting up at night can't stop the leakage and have to change the underwear.  During the day when walking to the bathroom.  I have leakage 3-4x per week.  I have UTI infections a couple x/month since this spring which is more than it has ever been.  Pt has pain and cramping when urinating during the UTI Fluid intake: Yes: 5 glasses of water and black tea     PAIN:  Are you having pain? No  PRECAUTIONS: None  WEIGHT BEARING RESTRICTIONS No  FALLS:  Has patient fallen in last 6 months? No  LIVING ENVIRONMENT: Lives with: lives with their family and lives with their spouse Lives in: House/apartment   OCCUPATION: retired Recreation: walking 5 day/week  PLOF: Independent  PATIENT GOALS stop having UTIs and decrease leakage  PERTINENT HISTORY:  Recurrent UTI, menopause Sexual abuse: No  BOWEL MOVEMENT Pain with bowel movement: No Type of bowel movement:Type (Bristol Stool Scale) normal, Frequency regular, and Strain No Fully empty rectum: Yes:     URINATION Pain with urination: No Fully empty bladder: Yes:   Stream: Weak Urgency: Yes: and not a lot comes out Frequency: every couple days; nocturia 2/night Leakage: Urge to void, Walking to the bathroom, Coughing, and Sneezing Pads: No  INTERCOURSE Pain with intercourse: NO   PREGNANCY Vaginal deliveries 3 Tearing Yes: maybe a little, no stitches   PROLAPSE Cystocele       OBJECTIVE:   DIAGNOSTIC FINDINGS:    PATIENT SURVEYS:    PFIQ-7   COGNITION:  Overall cognitive status: Within functional limits for tasks assessed     SENSATION:  Light touch: Appears intact   MUSCLE LENGTH: Hamstrings: Right WFL  deg; Left WFL  deg Thomas test:   LUMBAR SPECIAL TESTS:  Active SLR test - core weakness present due to instability of pelvis with ASLR  FUNCTIONAL TESTS:    GAIT:  Comments: WFL                POSTURE: rounded shoulders, increased lumbar lordosis, anterior pelvic tilt, and flexed trunk    PELVIC ALIGNMENT:  LUMBARAROM/PROM  A/PROM A/PROM  eval  Flexion   Extension   Right lateral flexion   Left lateral flexion   Right rotation   Left rotation    (Blank rows = not tested)  LOWER EXTREMITY ROM:  Passive ROM Right eval Left eval  Hip flexion    Hip extension    Hip abduction 4/5 4/5  Hip adduction    Hip internal rotation    Hip external rotation    Knee flexion    Knee extension    Ankle dorsiflexion    Ankle plantarflexion    Ankle inversion    Ankle eversion     (Blank rows = not tested)  LOWER EXTREMITY MMT:  MMT Right eval Left eval  Hip flexion    Hip extension    Hip abduction    Hip adduction    Hip internal rotation  75%  Hip external rotation  75%  Knee flexion    Knee extension    Ankle dorsiflexion    Ankle plantarflexion    Ankle inversion    Ankle eversion      PALPATION:   General  tight lumbar                External Perineal Exam  Internal Pelvic Floor   Patient confirms identification and approves PT to assess internal pelvic floor and treatment Yes  PELVIC MMT:   MMT eval  Vaginal 2/5 x 3 reps; hold 2 seconds  Internal Anal Sphincter   External Anal Sphincter   Puborectalis   Diastasis Recti   (Blank rows = not tested)        TONE: Low tone anterior wall  PROLAPSE: Anterior wall  TODAY'S TREATMENT  Date: 05/17/22  Nuero  Re-ed: Education and cues for coordination of breathing  Pelvic floor muscle contracting and relaxing at appropriate times during activities  Exercises: Staggered stance: 10lb diagonals with kegel Staggered stance row - 10lb - 12 x bil Side step 10 lb - 10x bil Seated ball squeeze with  Squat holding 5lb 2 x 10 Side step green loop around feet - 10x Hip flexor stretch - vibration plate Hamstring stretch - vibration plate  Date: 3/87/56  Nuero Re-ed: Education and cues for coordination of breathing  Pelvic floor muscle contracting and relaxing at appropriate times during activities  Exercises: Lumbar rotation stretch Thoracic sidelying Knee to chest Hip abduction supine Clamshells blue loop Seated ball squeeze with LE kick  Date: 04/24/22  Nuero Re-ed: Education and cues for coordination of breathing  Pelvic floor muscle contracting and relaxing at appropriate times during activities Biofeedback - kegel with supine; side lying clam; sit to stand; mini squat -- 15x each  Exercises: Hip flexor stretch Hamstring stretch   PATIENT EDUCATION:  Education details: Access Code: [email or web address] Person educated: Patient Education method: Explanation, Demonstration, Tactile cues, Verbal cues, and Handouts Education comprehension: verbalized understanding and returned demonstration   HOME EXERCISE PROGRAM: Access Code: [email or web address] URL: [email or web address] Date: 04/19/2022 Prepared by: Jari Favre  Exercises - Supine Pelvic Floor Contraction  - 3 x daily - 7 x weekly - 1 sets - 10 reps - 3 sec hold  ASSESSMENT:  CLINICAL IMPRESSION: Today's session focused on coordinating pelvic floor with functional movements and varying positions.  Was able to progress difficulty of exercises.  She reports less nocturia and less leakage overall.  Still has a hard time with sneezing.  Pt will benefit from skilled to progress to all functional activities.   OBJECTIVE  IMPAIRMENTS decreased coordination, decreased endurance, decreased ROM, decreased strength, impaired flexibility, postural dysfunction, and pain.   ACTIVITY LIMITATIONS continence  PARTICIPATION LIMITATIONS: community activity  PERSONAL FACTORS Age, Time since onset of injury/illness/exacerbation, and 1-2 comorbidities: 3 vaginal deliveries, some tearing  are also affecting patient's functional outcome.   REHAB POTENTIAL: Excellent  CLINICAL DECISION MAKING: Evolving/moderate complexity  EVALUATION COMPLEXITY: Moderate   GOALS: Goals reviewed with patient? Yes  SHORT TERM GOALS: Target date: 05/17/2022  Ind with initial HEP Baseline: Goal status: MET    LONG TERM GOALS: Target date: 07/12/2022  Updated 05/02/22  Pt will report 50% less bladder leakage Baseline: better at night - only happened 2x; sneezing is still hard Goal status: IN PROGRESS  2.  Pt will be independent with advanced HEP to maintain improvements made throughout therapy  Baseline:  Goal status: IN PROGRESS  3.  Pt will be able to functional actions such as walking to bathroom or sneezing without leakage  Baseline: sneezing is the worst Goal status: IN PROGRESS  4.  Pt will have nocturia decreased to 1/night at most  Baseline: 1-2/night, better than before Goal status: IN PROGRESS    PLAN: PT FREQUENCY: 1x/week  PT DURATION: 12 weeks  PLANNED INTERVENTIONS: Therapeutic exercises, Therapeutic activity,  Neuromuscular re-education, Balance training, Gait training, Patient/Family education, Self Care, Joint mobilization, Dry Needling, Electrical stimulation, Cryotherapy, Moist heat, Taping, Biofeedback, Manual therapy, and Re-evaluation  PLAN FOR NEXT SESSION: lumbar mobility, hip and core strength along with kegels   Camillo Flaming Tkai Large, PT 05/24/2022, 7:49 AM

## 2022-05-30 NOTE — Therapy (Signed)
OUTPATIENT PHYSICAL THERAPY FEMALE PELVIC EVALUATION   Patient Name: Monica Rogers MRN: [id number] DOB:07/26/1951, 71 y.o., female Today's Date: 05/31/2022   PT End of Session - 05/31/22 1107     Visit Number 5    Date for PT Re-Evaluation 07/12/22    Authorization Type [employer]    PT Start Time 1103    PT Stop Time 1143    PT Time Calculation (min) 40 min    Activity Tolerance Patient tolerated treatment well    Behavior During Therapy WFL for tasks assessed/performed                Past Medical History:  Diagnosis Date   Allergy    Anemia    Arthritis    Blood transfusion without reported diagnosis    Chronic headaches    Diverticulosis    Hyperlipidemia    Hypertension    Hypothyroidism    IBS (irritable bowel syndrome)    Prediabetes    Recurrent UTI    Past Surgical History:  Procedure Laterality Date   BREAST BIOPSY Right 2019   IR ANGIOGRAM SELECTIVE EACH ADDITIONAL VESSEL  09/28/2020   IR ANGIOGRAM SELECTIVE EACH ADDITIONAL VESSEL  09/28/2020   IR ANGIOGRAM SELECTIVE EACH ADDITIONAL VESSEL  09/28/2020   IR ANGIOGRAM SELECTIVE EACH ADDITIONAL VESSEL  09/28/2020   IR ANGIOGRAM VISCERAL SELECTIVE  09/28/2020   IR ANGIOGRAM VISCERAL SELECTIVE  09/28/2020   IR EMBO ART  VEN HEMORR LYMPH EXTRAV  INC GUIDE ROADMAPPING  09/28/2020   IR US GUIDE VASC ACCESS RIGHT  09/28/2020   TONSILLECTOMY     Patient Active Problem List   Diagnosis Date Noted   Prediabetes 02/22/2022   Lower GI bleed 09/29/2020   Hypothyroidism 09/29/2020   Acute blood loss anemia 09/29/2020   Sigmoid diverticulitis 09/28/2020   Hypertension    KNEE PAIN, RIGHT 11/18/2009   ANKLE PAIN, RIGHT 11/18/2009   SINUS TARSI SYNDROME, RIGHT FOOT 11/18/2009   PLANTAR FASCIITIS, LEFT 11/18/2009   HYPERTHYROIDISM 04/23/2008   HLD (hyperlipidemia) 04/23/2008   OVERWEIGHT 04/23/2008   ANXIETY 04/23/2008   HEADACHE, CHRONIC 04/23/2008   NONSPECIFIC ABNORMAL RESULTS LIVR FUNCTION STUDY  04/23/2008   ANEMIA, DEFICIENCY, HX OF 04/23/2008    PCP: Jeani Sow, MD  REFERRING PROVIDER: Marguerita Beards, MD  REFERRING DIAG:  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 I am leaking less that 50% of the time. 1x every 2-3 weeks when walking to the bathroom.  PAIN:  Are you having pain? No   Eval SUBJECTIVE STATEMENT: I was sent here due to chronic UTIs.  I am having leakage in the morning or getting up at night can't stop the leakage and have to change the underwear.  During the day when walking to the bathroom.  I have leakage 3-4x per week.  I have UTI infections a couple x/month since this spring which is more than it has ever been.  Pt has pain and cramping when urinating during the UTI Fluid intake: Yes: 5 glasses of water and black tea     PAIN:  Are you having pain? No  PRECAUTIONS: None  WEIGHT BEARING RESTRICTIONS No  FALLS:  Has patient fallen in last 6 months? No  LIVING ENVIRONMENT: Lives with: lives with their family and lives with their spouse Lives in: House/apartment   OCCUPATION: retired Recreation: walking 5 day/week  PLOF: Independent  PATIENT GOALS stop having UTIs and decrease leakage  PERTINENT HISTORY:  Recurrent UTI, menopause Sexual abuse: No  BOWEL MOVEMENT Pain with bowel movement: No Type of bowel movement:Type (Bristol Stool Scale) normal, Frequency regular, and Strain No Fully empty rectum:  Yes:     URINATION Pain with urination: No Fully empty bladder: Yes:   Stream: Weak Urgency: Yes: and not a lot comes out Frequency: every couple days; nocturia 2/night Leakage: Urge to void, Walking to the bathroom, Coughing, and Sneezing Pads: No  INTERCOURSE Pain with intercourse: NO   PREGNANCY Vaginal deliveries 3 Tearing Yes: maybe a little, no stitches   PROLAPSE Cystocele      OBJECTIVE:   DIAGNOSTIC FINDINGS:    PATIENT SURVEYS:    PFIQ-7   COGNITION:  Overall cognitive status: Within functional limits for tasks assessed     SENSATION:  Light touch: Appears intact   MUSCLE LENGTH: Hamstrings: Right WFL  deg; Left WFL  deg Thomas test:   LUMBAR SPECIAL TESTS:  Active SLR test - core weakness present due to instability of pelvis with ASLR  FUNCTIONAL TESTS:    GAIT:  Comments: WFL                POSTURE: rounded shoulders, increased lumbar lordosis, anterior pelvic tilt, and flexed trunk    PELVIC ALIGNMENT:  LUMBARAROM/PROM  A/PROM A/PROM  eval  Flexion   Extension   Right lateral flexion   Left lateral flexion   Right rotation   Left rotation    (Blank rows = not tested)  LOWER EXTREMITY ROM:  Passive ROM Right eval Left eval  Hip flexion    Hip extension    Hip abduction 4/5 4/5  Hip adduction    Hip internal rotation    Hip external rotation    Knee flexion    Knee extension    Ankle dorsiflexion    Ankle plantarflexion    Ankle inversion    Ankle eversion     (Blank rows = not tested)  LOWER EXTREMITY MMT:  MMT Right eval Left eval  Hip flexion    Hip extension    Hip abduction    Hip adduction    Hip internal rotation  75%  Hip external rotation  75%  Knee flexion    Knee extension    Ankle dorsiflexion    Ankle plantarflexion    Ankle  inversion    Ankle eversion      PALPATION:   General  tight lumbar                External Perineal Exam                              Internal Pelvic Floor    Patient confirms identification and approves PT to assess internal pelvic floor and treatment Yes  PELVIC MMT:   MMT eval  Vaginal 2/5 x 3 reps; hold 2 seconds  Internal Anal Sphincter   External Anal Sphincter   Puborectalis   Diastasis Recti   (Blank rows = not tested)        TONE: Low tone anterior wall  PROLAPSE: Anterior wall  TODAY'S TREATMENT   Date: 05/31/22  Nuero Re-ed: Education and cues for coordination of breathing with exhale - reviewed again in supine with yoga block two ways  Pelvic floor muscle contracting and relaxing at appropriate times during activities  Exercises:  Side step green loop - 12x bil Seated ball squeeze with kegel 15x Seated ball squeeze with LAQ - 20x Row with 7lb in each hand - exhale and kegel with exertions Hip flexor stretch - vibration plate Hamstring stretch - vibration plate 1H08 sec  Date: 05/17/22  Nuero Re-ed: Education and cues for coordination of breathing  Pelvic floor muscle contracting and relaxing at appropriate times during activities  Exercises: Staggered stance: 10lb diagonals with kegel Staggered stance row - 10lb - 12 x bil Side step 10 lb - 10x bil Seated ball squeeze with  Squat holding 5lb 2 x 10 Side step green loop around feet - 10x Hip flexor stretch - vibration plate Hamstring stretch - vibration plate  Date: 6/57/84  Nuero Re-ed: Education and cues for coordination of breathing  Pelvic floor muscle contracting and relaxing at appropriate times during activities  Exercises: Lumbar rotation stretch Thoracic sidelying Knee to chest Hip abduction supine Clamshells blue loop Seated ball squeeze with LE kick    PATIENT EDUCATION:  Education details: Access Code: [email or web address] Person educated: Patient Education method: Explanation, Demonstration, Tactile cues, Verbal cues, and Handouts Education comprehension: verbalized understanding and returned demonstration   HOME EXERCISE  PROGRAM: Access Code: [email or web address] URL: [email or web address] Date: 04/19/2022 Prepared by: Dwana Curd  Exercises - Supine Pelvic Floor Contraction  - 3 x daily - 7 x weekly - 1 sets - 10 reps - 3 sec hold  ASSESSMENT:  CLINICAL IMPRESSION: Today's session focused on exercise progression with focus on breathing technique review.  Pt did well with TC and needed reminders initially.  She was then able to progress and do exercises correctly.  New exercises added to HEP to continue to work on progression at home.   OBJECTIVE IMPAIRMENTS decreased coordination, decreased endurance, decreased ROM, decreased strength, impaired flexibility, postural dysfunction, and pain.   ACTIVITY LIMITATIONS continence  PARTICIPATION LIMITATIONS: community activity  PERSONAL FACTORS Age, Time since onset of injury/illness/exacerbation, and 1-2 comorbidities: 3 vaginal deliveries, some tearing  are also affecting patient's functional outcome.   REHAB POTENTIAL: Excellent  CLINICAL DECISION MAKING: Evolving/moderate complexity  EVALUATION COMPLEXITY: Moderate   GOALS: Goals reviewed with patient? Yes  SHORT TERM GOALS: Target date: 05/17/2022  Ind with initial HEP Baseline: Goal status: MET    LONG TERM GOALS: Target date: 07/12/2022  Updated 05/02/22  Pt will report 50% less bladder leakage Baseline: better at night - only happened 2x;  sneezing is still hard Goal status: IN PROGRESS  2.  Pt will be independent with advanced HEP to maintain improvements made throughout therapy  Baseline:  Goal status: IN PROGRESS  3.  Pt will be able to functional actions such as walking to bathroom or sneezing without leakage  Baseline: sneezing is the worst Goal status: IN PROGRESS  4.  Pt will have nocturia decreased to 1/night at most  Baseline: 1-2/night, better than before Goal status: IN PROGRESS    PLAN: PT FREQUENCY: 1x/week  PT DURATION: 12 weeks  PLANNED  INTERVENTIONS: Therapeutic exercises, Therapeutic activity, Neuromuscular re-education, Balance training, Gait training, Patient/Family education, Self Care, Joint mobilization, Dry Needling, Electrical stimulation, Cryotherapy, Moist heat, Taping, Biofeedback, Manual therapy, and Re-evaluation  PLAN FOR NEXT SESSION: lumbar mobility, hip and core strength along with kegels   Brayton Caves Jamile Sivils, PT 05/31/2022, 11:07 AM

## 2022-05-31 ENCOUNTER — Ambulatory Visit: Payer: No Typology Code available for payment source | Admitting: Physical Therapy

## 2022-05-31 ENCOUNTER — Encounter: Payer: Self-pay | Admitting: Physical Therapy

## 2022-05-31 DIAGNOSIS — M6281 Muscle weakness (generalized): Secondary | ICD-10-CM | POA: Diagnosis not present

## 2022-05-31 DIAGNOSIS — R279 Unspecified lack of coordination: Secondary | ICD-10-CM

## 2022-06-07 ENCOUNTER — Ambulatory Visit: Payer: No Typology Code available for payment source | Admitting: Physical Therapy

## 2022-06-07 ENCOUNTER — Encounter: Payer: Self-pay | Admitting: Physical Therapy

## 2022-06-07 ENCOUNTER — Ambulatory Visit
Payer: No Typology Code available for payment source | Attending: Obstetrics and Gynecology | Admitting: Physical Therapy

## 2022-06-07 DIAGNOSIS — N393 Stress incontinence (female) (male): Secondary | ICD-10-CM | POA: Insufficient documentation

## 2022-06-07 DIAGNOSIS — R82998 Other abnormal findings in urine: Secondary | ICD-10-CM | POA: Insufficient documentation

## 2022-06-07 DIAGNOSIS — M6281 Muscle weakness (generalized): Secondary | ICD-10-CM | POA: Insufficient documentation

## 2022-06-07 DIAGNOSIS — R279 Unspecified lack of coordination: Secondary | ICD-10-CM | POA: Diagnosis not present

## 2022-06-07 DIAGNOSIS — N8110 Cystocele, unspecified: Secondary | ICD-10-CM | POA: Insufficient documentation

## 2022-06-07 NOTE — Therapy (Signed)
OUTPATIENT PHYSICAL THERAPY FEMALE PELVIC EVALUATION   Patient Name: Monica Rogers MRN: [id number] DOB:07-04-1951, 71 y.o., female Today's Date: 06/07/2022   PT End of Session - 06/07/22 1223     Visit Number 6    Date for PT Re-Evaluation 07/12/22    PT Start Time 1150    PT Stop Time 1231    PT Time Calculation (min) 41 min    Activity Tolerance Patient tolerated treatment well    Behavior During Therapy WFL for tasks assessed/performed                 Past Medical History:  Diagnosis Date   Allergy    Anemia    Arthritis    Blood transfusion without reported diagnosis    Chronic headaches    Diverticulosis    Hyperlipidemia    Hypertension    Hypothyroidism    IBS (irritable bowel syndrome)    Prediabetes    Recurrent UTI    Past Surgical History:  Procedure Laterality Date   BREAST BIOPSY Right 2019   IR ANGIOGRAM SELECTIVE EACH ADDITIONAL VESSEL  09/28/2020   IR ANGIOGRAM SELECTIVE EACH ADDITIONAL VESSEL  09/28/2020   IR ANGIOGRAM SELECTIVE EACH ADDITIONAL VESSEL  09/28/2020   IR ANGIOGRAM SELECTIVE EACH ADDITIONAL VESSEL  09/28/2020   IR ANGIOGRAM VISCERAL SELECTIVE  09/28/2020   IR ANGIOGRAM VISCERAL SELECTIVE  09/28/2020   IR EMBO ART  VEN HEMORR LYMPH EXTRAV  INC GUIDE ROADMAPPING  09/28/2020   IR US GUIDE VASC ACCESS RIGHT  09/28/2020   TONSILLECTOMY     Patient Active Problem List   Diagnosis Date Noted   Prediabetes 02/22/2022   Lower GI bleed 09/29/2020   Hypothyroidism 09/29/2020   Acute blood loss anemia 09/29/2020   Sigmoid diverticulitis 09/28/2020   Hypertension    KNEE PAIN, RIGHT 11/18/2009   ANKLE PAIN, RIGHT 11/18/2009   SINUS TARSI SYNDROME, RIGHT FOOT 11/18/2009   PLANTAR FASCIITIS, LEFT 11/18/2009   HYPERTHYROIDISM 04/23/2008   HLD (hyperlipidemia) 04/23/2008   OVERWEIGHT 04/23/2008   ANXIETY 04/23/2008   HEADACHE, CHRONIC 04/23/2008   NONSPECIFIC ABNORMAL RESULTS LIVR FUNCTION STUDY 04/23/2008   ANEMIA, DEFICIENCY, HX  OF 04/23/2008    PCP: Tawnya Crook, MD  REFERRING PROVIDER: Jaquita Folds, MD  REFERRING DIAG:  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 Still some leaking with sneezing but it is better.  PAIN:  Are you having pain? No   Eval SUBJECTIVE STATEMENT: I was sent here due to chronic UTIs.  I am having leakage in the morning or getting up at night can't stop the leakage and have to change the underwear.  During the day when walking to the bathroom.  I have leakage 3-4x per week.  I have UTI infections a couple x/month since this spring which is more than it has ever been.  Pt has pain and cramping when urinating during the UTI Fluid intake: Yes: 5 glasses of water and black tea     PAIN:  Are you having pain? No  PRECAUTIONS: None  WEIGHT BEARING RESTRICTIONS No  FALLS:  Has patient fallen in last 6 months? No  LIVING ENVIRONMENT: Lives with: lives with their family and lives with their spouse Lives in: House/apartment   OCCUPATION: retired Recreation: walking 5 day/week  PLOF: Independent  PATIENT GOALS stop having UTIs and decrease leakage  PERTINENT HISTORY:  Recurrent UTI, menopause Sexual abuse: No  BOWEL MOVEMENT Pain with bowel movement: No Type of bowel movement:Type (Bristol Stool Scale) normal, Frequency regular, and Strain No Fully empty rectum: Yes:     URINATION Pain with urination: No Fully empty bladder: Yes:   Stream:  Weak Urgency: Yes: and not a lot comes out Frequency: every couple days; nocturia 2/night Leakage: Urge to void, Walking to the bathroom, Coughing, and Sneezing Pads: No  INTERCOURSE Pain with intercourse: NO   PREGNANCY Vaginal deliveries 3 Tearing Yes: maybe a little, no stitches   PROLAPSE Cystocele      OBJECTIVE:   DIAGNOSTIC FINDINGS:    PATIENT SURVEYS:    PFIQ-7   COGNITION:  Overall cognitive status: Within functional limits for tasks assessed     SENSATION:  Light touch: Appears intact   MUSCLE LENGTH: Hamstrings: Right WFL  deg; Left WFL  deg Thomas test:   LUMBAR SPECIAL TESTS:  Active SLR test - core weakness present due to instability of pelvis with ASLR  FUNCTIONAL TESTS:    GAIT:  Comments: WFL                POSTURE: rounded shoulders, increased lumbar lordosis, anterior pelvic tilt, and flexed trunk    PELVIC ALIGNMENT:  LUMBARAROM/PROM  A/PROM A/PROM  eval  Flexion   Extension   Right lateral flexion   Left lateral flexion   Right rotation   Left rotation    (Blank rows = not tested)  LOWER EXTREMITY ROM:  Passive ROM Right eval Left eval  Hip flexion    Hip extension    Hip abduction 4/5 4/5  Hip adduction    Hip internal rotation    Hip external rotation    Knee flexion    Knee extension    Ankle dorsiflexion    Ankle plantarflexion    Ankle inversion    Ankle eversion     (Blank rows = not tested)  LOWER EXTREMITY MMT:  MMT Right eval Left eval  Hip flexion    Hip extension    Hip abduction    Hip adduction    Hip internal rotation  75%  Hip external rotation  75%  Knee flexion    Knee extension    Ankle dorsiflexion    Ankle plantarflexion    Ankle inversion    Ankle eversion      PALPATION:  General  tight lumbar                External Perineal Exam                              Internal Pelvic Floor   Patient confirms identification and approves PT to assess internal pelvic floor  and treatment Yes  PELVIC MMT:   MMT eval  Vaginal 2/5 x 3 reps; hold 2 seconds  Internal Anal Sphincter   External Anal Sphincter   Puborectalis   Diastasis Recti   (Blank rows = not tested)        TONE: Low tone anterior wall  PROLAPSE: Anterior wall  TODAY'S TREATMENT   Date: 06/07/22  Nuero Re-ed: Education and cues for coordination of breathing without holding breath -  supine with yoga block two ways and holding 5 sec breathing normally  Pelvic floor muscle contracting and relaxing at appropriate times during activities  Exercises:  Side step green loop - 12x bil Seated UE lift 3lb with kegel 15x Seated ball squeeze with LAQ - 20x Row with 7lb single arm in staggered stance - exhale and kegel with exertions Diagonal with staggered stance - 3lb - kegel with exertion Hip flexor stretch - vibration plate Hamstring stretch - vibration plate 2x30 sec  Date: 05/31/22  Nuero Re-ed: Education and cues for coordination of breathing with exhale - reviewed again in supine with yoga block two ways  Pelvic floor muscle contracting and relaxing at appropriate times during activities  Exercises:  Side step green loop - 12x bil Seated ball squeeze with kegel 15x Seated ball squeeze with LAQ - 20x Row with 7lb in each hand - exhale and kegel with exertions Hip flexor stretch - vibration plate Hamstring stretch - vibration plate 2x30 sec  Date: 05/17/22  Nuero Re-ed: Education and cues for coordination of breathing  Pelvic floor muscle contracting and relaxing at appropriate times during activities  Exercises: Staggered stance: 10lb diagonals with kegel Staggered stance row - 10lb - 12 x bil Side step 10 lb - 10x bil Seated ball squeeze with  Squat holding 5lb 2 x 10 Side step green loop around feet - 10x Hip flexor stretch - vibration plate Hamstring stretch - vibration plate    PATIENT EDUCATION:  Education details: Access Code: [email or web address] Person educated:  Patient Education method: Explanation, Demonstration, Tactile cues, Verbal cues, and Handouts Education comprehension: verbalized understanding and returned demonstration   HOME EXERCISE PROGRAM: Access Code: [email or web address] URL: [email or web address] Date: 04/19/2022 Prepared by: Jari Favre  Exercises - Supine Pelvic Floor Contraction  - 3 x daily - 7 x weekly - 1 sets - 10 reps - 3 sec hold  ASSESSMENT:  CLINICAL IMPRESSION: Today's session continues to address coordinating pelvic floor with functional lifting and pulling adding more challenging positions and weight for building core strength.  Pt still having leakage with sneezing but not as much.  She is expected to continue to make progress with skilled PT with core and pelvic strength and coordination.   OBJECTIVE IMPAIRMENTS decreased coordination, decreased endurance, decreased ROM, decreased strength, impaired flexibility, postural dysfunction, and pain.   ACTIVITY LIMITATIONS continence  PARTICIPATION LIMITATIONS: community activity  PERSONAL FACTORS Age, Time since onset of injury/illness/exacerbation, and 1-2 comorbidities: 3 vaginal deliveries, some tearing  are also affecting patient's functional outcome.   REHAB POTENTIAL: Excellent  CLINICAL DECISION MAKING: Evolving/moderate complexity  EVALUATION COMPLEXITY: Moderate   GOALS: Goals  reviewed with patient? Yes  SHORT TERM GOALS: Target date: 05/17/2022  Ind with initial HEP Baseline: Goal status: MET    LONG TERM GOALS: Target date: 07/12/2022  Updated 06/07/22  Pt will report 50% less bladder leakage Baseline: better at night - only happened 2x; sneezing is still hard Goal status: IN PROGRESS  2.  Pt will be independent with advanced HEP to maintain improvements made throughout therapy  Baseline:  Goal status: IN PROGRESS  3.  Pt will be able to functional actions such as walking to bathroom or sneezing without leakage   Baseline: sneezing is the worst Goal status: IN PROGRESS  4.  Pt will have nocturia decreased to 1/night at most  Baseline: 1-2/night, better than before Goal status: IN PROGRESS    PLAN: PT FREQUENCY: 1x/week  PT DURATION: 12 weeks  PLANNED INTERVENTIONS: Therapeutic exercises, Therapeutic activity, Neuromuscular re-education, Balance training, Gait training, Patient/Family education, Self Care, Joint mobilization, Dry Needling, Electrical stimulation, Cryotherapy, Moist heat, Taping, Biofeedback, Manual therapy, and Re-evaluation  PLAN FOR NEXT SESSION: lumbar mobility, hip and core strength along with kegels, check Rt gluteal for trigger points due to pain when moving in bed   [employer], PT 06/07/2022, 1:15 PM

## 2022-06-14 ENCOUNTER — Encounter: Payer: Self-pay | Admitting: Physical Therapy

## 2022-06-14 ENCOUNTER — Ambulatory Visit: Payer: No Typology Code available for payment source | Admitting: Physical Therapy

## 2022-06-14 DIAGNOSIS — M6281 Muscle weakness (generalized): Secondary | ICD-10-CM | POA: Diagnosis not present

## 2022-06-14 DIAGNOSIS — R279 Unspecified lack of coordination: Secondary | ICD-10-CM

## 2022-06-14 NOTE — Therapy (Signed)
OUTPATIENT PHYSICAL THERAPY FEMALE PELVIC EVALUATION   Patient Name: Monica Rogers MRN: [id number] DOB:January 29, 1951, 71 y.o., female Today's Date: 06/14/2022   PT End of Session - 06/14/22 1233     Visit Number 7    Date for PT Re-Evaluation 07/12/22    Authorization Type [employer]    PT Start Time 1147    PT Stop Time 1233    PT Time Calculation (min) 46 min    Activity Tolerance Patient tolerated treatment well    Behavior During Therapy WFL for tasks assessed/performed                  Past Medical History:  Diagnosis Date   Allergy    Anemia    Arthritis    Blood transfusion without reported diagnosis    Chronic headaches    Diverticulosis    Hyperlipidemia    Hypertension    Hypothyroidism    IBS (irritable bowel syndrome)    Prediabetes    Recurrent UTI    Past Surgical History:  Procedure Laterality Date   BREAST BIOPSY Right 2019   IR ANGIOGRAM SELECTIVE EACH ADDITIONAL VESSEL  09/28/2020   IR ANGIOGRAM SELECTIVE EACH ADDITIONAL VESSEL  09/28/2020   IR ANGIOGRAM SELECTIVE EACH ADDITIONAL VESSEL  09/28/2020   IR ANGIOGRAM SELECTIVE EACH ADDITIONAL VESSEL  09/28/2020   IR ANGIOGRAM VISCERAL SELECTIVE  09/28/2020   IR ANGIOGRAM VISCERAL SELECTIVE  09/28/2020   IR EMBO ART  VEN HEMORR LYMPH EXTRAV  INC GUIDE ROADMAPPING  09/28/2020   IR US GUIDE VASC ACCESS RIGHT  09/28/2020   TONSILLECTOMY     Patient Active Problem List   Diagnosis Date Noted   Prediabetes 02/22/2022   Lower GI bleed 09/29/2020   Hypothyroidism 09/29/2020   Acute blood loss anemia 09/29/2020   Sigmoid diverticulitis 09/28/2020   Hypertension    KNEE PAIN, RIGHT 11/18/2009   ANKLE PAIN, RIGHT 11/18/2009   SINUS TARSI SYNDROME, RIGHT FOOT 11/18/2009   PLANTAR FASCIITIS, LEFT 11/18/2009   HYPERTHYROIDISM 04/23/2008   HLD (hyperlipidemia) 04/23/2008   OVERWEIGHT 04/23/2008   ANXIETY 04/23/2008   HEADACHE, CHRONIC 04/23/2008   NONSPECIFIC ABNORMAL RESULTS LIVR FUNCTION  STUDY 04/23/2008   ANEMIA, DEFICIENCY, HX OF 04/23/2008    PCP: Tawnya Crook, MD  REFERRING PROVIDER: Jaquita Folds, MD  REFERRING DIAG:  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 She had little leakage with coughing. She is having trouble with bracing before a cough or a sneeze. She feels like today is good to be the last session and she will be able to continue her progress on her own.  PAIN:  Are you having pain? No   Eval SUBJECTIVE STATEMENT: I was sent here due to chronic UTIs.  I am having leakage in the morning or getting up at night can't stop the leakage and have to change the underwear.  During the day when walking to the bathroom.  I have leakage 3-4x per week.  I have UTI infections a couple x/month since this spring which is more than it has ever been.  Pt has pain and cramping when urinating during the UTI Fluid intake: Yes: 5 glasses of water and black tea     PAIN:  Are you having pain? No  PRECAUTIONS: None  WEIGHT BEARING RESTRICTIONS No  FALLS:  Has patient fallen in last 6 months? No  LIVING ENVIRONMENT: Lives with: lives with their family and lives with their spouse Lives in: House/apartment   OCCUPATION: retired Recreation: walking 5 day/week  PLOF: Independent  PATIENT GOALS stop having UTIs and decrease leakage  PERTINENT HISTORY:  Recurrent UTI, menopause Sexual abuse: No  BOWEL MOVEMENT Pain with bowel movement: No Type of bowel  movement:Type (Bristol Stool Scale) normal, Frequency regular, and Strain No Fully empty rectum: Yes:     URINATION Pain with urination: No Fully empty bladder: Yes:   Stream: Weak Urgency: Yes: and not a lot comes out Frequency: every couple days; nocturia 2/night Leakage: Urge to void, Walking to the bathroom, Coughing, and Sneezing Pads: No  INTERCOURSE Pain with intercourse: NO   PREGNANCY Vaginal deliveries 3 Tearing Yes: maybe a little, no stitches   PROLAPSE Cystocele      OBJECTIVE:   DIAGNOSTIC FINDINGS:    PATIENT SURVEYS:    PFIQ-7   COGNITION:  Overall cognitive status: Within functional limits for tasks assessed     SENSATION:  Light touch: Appears intact   MUSCLE LENGTH: Hamstrings: Right WFL  deg; Left WFL  deg Thomas test:   LUMBAR SPECIAL TESTS:  Active SLR test - core weakness present due to instability of pelvis with ASLR  FUNCTIONAL TESTS:    GAIT:  Comments: WFL                POSTURE: rounded shoulders, increased lumbar lordosis, anterior pelvic tilt, and flexed trunk    PELVIC ALIGNMENT:  LUMBARAROM/PROM  A/PROM A/PROM  eval  Flexion   Extension   Right lateral flexion   Left lateral flexion   Right rotation   Left rotation    (Blank rows = not tested)  LOWER EXTREMITY ROM:  Passive ROM Right eval Left eval  Hip flexion    Hip extension    Hip abduction 4/5 4/5  Hip adduction    Hip internal rotation    Hip external rotation    Knee flexion    Knee extension    Ankle dorsiflexion    Ankle plantarflexion    Ankle inversion    Ankle eversion     (Blank rows = not tested)  LOWER EXTREMITY MMT:  MMT Right eval Left eval  Hip flexion    Hip extension    Hip abduction    Hip adduction    Hip internal rotation  75%  Hip external rotation  75%  Knee flexion  Knee extension    Ankle dorsiflexion    Ankle plantarflexion    Ankle inversion    Ankle eversion      PALPATION:   General  tight  lumbar                External Perineal Exam                              Internal Pelvic Floor   Patient confirms identification and approves PT to assess internal pelvic floor and treatment Yes  PELVIC MMT:   MMT eval  Vaginal 2/5 x 3 reps; hold 2 seconds  Internal Anal Sphincter   External Anal Sphincter   Puborectalis   Diastasis Recti   (Blank rows = not tested)        TONE: Low tone anterior wall  PROLAPSE: Anterior wall  TODAY'S TREATMENT  Date: 06/14/22  Nuero Re-ed: Education and cues for coordination of breathing without holding breath -  supine with yoga block two ways and holding 5 sec breathing normally Pelvic floor muscle contracting and relaxing at appropriate times during activities  Exercises:  Clam shells  Mini squats into chair with two foam pads Side step green loop - 12x bil Seated UE lift 3lb with kegel 15x Seated ball squeeze with LAQ - 20x Row with 7lb single arm in staggered stance - exhale and kegel with exertions Diagonal with staggered stance - 3lb - kegel with exertion Hamstring stretch - vibration plate 2x30 sec Posterior pelvic tilt on wall with arm lifts   Date: 06/07/22  Nuero Re-ed: Education and cues for coordination of breathing without holding breath -  supine with yoga block two ways and holding 5 sec breathing normally  Pelvic floor muscle contracting and relaxing at appropriate times during activities  Exercises:  Side step green loop - 12x bil Seated UE lift 3lb with kegel 15x Seated ball squeeze with LAQ - 20x Row with 7lb single arm in staggered stance - exhale and kegel with exertions Diagonal with staggered stance - 3lb - kegel with exertion Hip flexor stretch - vibration plate Hamstring stretch - vibration plate 2x30 sec  Date: 05/31/22  Nuero Re-ed: Education and cues for coordination of breathing with exhale - reviewed again in supine with yoga block two ways  Pelvic floor muscle contracting and relaxing  at appropriate times during activities  Exercises:  Side step green loop - 12x bil Seated ball squeeze with kegel 15x Seated ball squeeze with LAQ - 20x Row with 7lb in each hand - exhale and kegel with exertions Hip flexor stretch - vibration plate Hamstring stretch - vibration plate 2x30 sec  Date: 05/17/22  Nuero Re-ed: Education and cues for coordination of breathing  Pelvic floor muscle contracting and relaxing at appropriate times during activities  Exercises: Staggered stance: 10lb diagonals with kegel Staggered stance row - 10lb - 12 x bil Side step 10 lb - 10x bil Seated ball squeeze with  Squat holding 5lb 2 x 10 Side step green loop around feet - 10x Hip flexor stretch - vibration plate Hamstring stretch - vibration plate    PATIENT EDUCATION:  Education details: Access Code: [email or web address] Person educated: Patient Education method: Explanation, Demonstration, Tactile cues, Verbal cues, and Handouts Education comprehension: verbalized understanding and returned demonstration   HOME EXERCISE PROGRAM: Access Code: [email or web address] URL: [email or web address] Date: 04/19/2022 Prepared by: Jari Favre  Exercises - Supine Pelvic Floor Contraction  - 3  x daily - 7 x weekly - 1 sets - 10 reps - 3 sec hold  ASSESSMENT:  CLINICAL IMPRESSION: Today's session continues to address coordinating pelvic floor with functional lifting and pulling adding more challenging positions and weight for building core strength with reviewing her HEP.  Pt still having leakage with sneezing but not as much and was taught to brace before sneezing. Patient has met majority her goals and feels she will be able to continue her progress on her own. Patient was discharged with a HEP.  OBJECTIVE IMPAIRMENTS decreased coordination, decreased endurance, decreased ROM, decreased strength, impaired flexibility, postural dysfunction, and pain.   ACTIVITY LIMITATIONS  continence  PARTICIPATION LIMITATIONS: community activity  PERSONAL FACTORS Age, Time since onset of injury/illness/exacerbation, and 1-2 comorbidities: 3 vaginal deliveries, some tearing  are also affecting patient's functional outcome.   REHAB POTENTIAL: Excellent  CLINICAL DECISION MAKING: Evolving/moderate complexity  EVALUATION COMPLEXITY: Moderate   GOALS: Goals reviewed with patient? Yes  SHORT TERM GOALS: Target date: 05/17/2022  Ind with initial HEP Baseline: Goal status: MET    LONG TERM GOALS: Target date: 07/12/2022  Updated 06/14/2022  Pt will report 50% less bladder leakage -Baseline: 75-80% less leakage  Goal status: MET  2.  Pt will be independent with advanced HEP to maintain improvements made throughout therapy  Baseline:  Goal status: MET  3.  Pt will be able to functional actions such as walking to bathroom or sneezing without leakage  Baseline: 90% able to walk to the bathroom with out leakage and sneezing 50% better  Goal status: NOT MET  4.  Pt will have nocturia decreased to 1/night at most  Baseline: once  Goal status: MET    PLAN: PT FREQUENCY: 1x/week  PT DURATION: 12 weeks  PLANNED INTERVENTIONS: Therapeutic exercises, Therapeutic activity, Neuromuscular re-education, Balance training, Gait training, Patient/Family education, Self Care, Joint mobilization, Dry Needling, Electrical stimulation, Cryotherapy, Moist heat, Taping, Biofeedback, Manual therapy, and Re-evaluation  PLAN FOR NEXT SESSION: discharged    Rosario Jacks, Student-PT 06/14/2022  12:39 PM    Gustavus Bryant, PT 06/14/22 12:53 PM I agree with the following treatment note after reviewing documentation. This session was performed under the supervision of a licensed clinician.   PHYSICAL THERAPY DISCHARGE SUMMARY  Visits from Start of Care: 8  Current functional level related to goals / functional outcomes: Goals met    Remaining deficits: Minor leakage  with sneezing    Education / Equipment: HEP   Patient agrees to discharge. Patient goals were partially met. Patient is being discharged due to being pleased with the current functional level.   Gustavus Bryant, PT 06/14/22 12:53 PM

## 2022-06-21 ENCOUNTER — Encounter: Payer: No Typology Code available for payment source | Admitting: Physical Therapy

## 2022-07-03 ENCOUNTER — Encounter: Payer: Self-pay | Admitting: Obstetrics and Gynecology

## 2022-07-03 ENCOUNTER — Other Ambulatory Visit (HOSPITAL_COMMUNITY)
Admission: RE | Admit: 2022-07-03 | Discharge: 2022-07-03 | Disposition: A | Discharge status: Home / Self Care | Payer: No Typology Code available for payment source | Source: Ambulatory Visit | Attending: Obstetrics and Gynecology | Admitting: Obstetrics and Gynecology

## 2022-07-03 ENCOUNTER — Ambulatory Visit (INDEPENDENT_AMBULATORY_CARE_PROVIDER_SITE_OTHER): Payer: No Typology Code available for payment source | Admitting: Obstetrics and Gynecology

## 2022-07-03 VITALS — BP 130/70 | HR 82

## 2022-07-03 DIAGNOSIS — N898 Other specified noninflammatory disorders of vagina: Secondary | ICD-10-CM | POA: Diagnosis not present

## 2022-07-03 DIAGNOSIS — R82998 Other abnormal findings in urine: Secondary | ICD-10-CM | POA: Diagnosis not present

## 2022-07-03 DIAGNOSIS — M6281 Muscle weakness (generalized): Secondary | ICD-10-CM | POA: Diagnosis not present

## 2022-07-03 DIAGNOSIS — R350 Frequency of micturition: Secondary | ICD-10-CM | POA: Diagnosis not present

## 2022-07-03 LAB — POCT URINALYSIS DIPSTICK
Bilirubin, UA: NEGATIVE
Blood, UA: NEGATIVE
Glucose, UA: NEGATIVE
Ketones, UA: NEGATIVE
Nitrite, UA: NEGATIVE
Protein, UA: NEGATIVE
Spec Grav, UA: 1.025 (ref 1.010–1.025)
Urobilinogen, UA: 0.2 E.U./dL
pH, UA: 7 (ref 5.0–8.0)

## 2022-07-03 NOTE — Progress Notes (Signed)
[facility] Return Visit  SUBJECTIVE  History of Present Illness: Monica Rogers is a 71 y.o. female seen in follow-up for bladder and bowel incontinence and recurrent UTI. Plan at last visit was to start vaginal estrogen cream and pelvic PT.   Currently has some mild UTI symptoms. Not sure if she has an infection.  She feels she has a yeast infection- has taken diflucan in the summer but still having itching externally but has not noticed a discharge.   Not using the estrogen cream anymore but used it for 3-4 months. She felt it was irritating for her.   She felt that physical therapy went well. She does feel that the leakage is better. Mostly has leakage with cough sneeze, occasional urgency. Bowel leakage is only happening after bowel movements. She is doing the exercises at home. Does not want any additional treatment for the leakage.   Past Medical History: Patient  has a past medical history of Allergy, Anemia, Arthritis, Blood transfusion without reported diagnosis, Chronic headaches, Diverticulosis, Hyperlipidemia, Hypertension, Hypothyroidism, IBS (irritable bowel syndrome), Prediabetes, and Recurrent UTI.   Past Surgical History: She  has a past surgical history that includes Breast biopsy (Right, 2019); IR US Guide Vasc Access Right (09/28/2020); IR Angiogram Selective Each Additional Vessel (09/28/2020); IR Angiogram Visceral Selective (09/28/2020); IR EMBO ART  VEN HEMORR LYMPH EXTRAV  INC GUIDE ROADMAPPING (09/28/2020); IR Angiogram Selective Each Additional Vessel (09/28/2020); IR Angiogram Selective Each Additional Vessel (09/28/2020); IR Angiogram Visceral Selective (09/28/2020); IR Angiogram Selective Each Additional Vessel (09/28/2020); and Tonsillectomy.   Medications: She has a current medication list which includes the following prescription(s): AMBULATORY NON FORMULARY MEDICATION, armour thyroid, boswellia serrata, butalbital-acetaminophen-caffeine, calcium,  carboxymethylcellulose sodium, chondroitin sulfate, coenzyme q10, dandelion, dexamethasone, dicyclomine, enzyme digest, estradiol, estradiol, fluconazole, fluocinolone acetonide, fluticasone, glucosamine-chondroitin, magnesium, olmesartan, omega-3 fatty acids, OVER THE COUNTER MEDICATION, phenazopyridine, progesterone, quercetin, rosuvastatin, thiamine hcl, cholecalciferol, and zinc sulfate.   Allergies: Patient is allergic to dairycare [lactase-lactobacillus], june grass pollen standardized [timothy grass pollen allergen], and wheat bran.   Social History: Patient  reports that she has never smoked. She has never used smokeless tobacco. She reports that she does not drink alcohol and does not use drugs.      OBJECTIVE     Physical Exam: Vitals:   07/03/22 1148  BP: 130/70  Pulse: 82   Gen: No apparent distress, A&O x 3.  Detailed Urogynecologic Evaluation:  Normal external genitalia, atrophy noted at the introitus, no erythema. Speculum exam shows normal mucosa, no discharge present. Aptima swab obtained.     POC urine: small leukocytes  ASSESSMENT AND PLAN    Monica Rogers is a 71 y.o. with:  1. Urinary frequency   2. Leukocytes in urine   3. Vaginal irritation    - Urine sent for culture.  - We discussed that vaginal irritation likely due to atrophy. Aptima swab sent to rule out infection.  - Recommended restarting vaginal estrogen. Reviewed other formularies other than the cream- tablets and ring are available and may be less irritating. She has some cream left so will restart that at this time. Use 0.5g twice weekly.  - Also recommended moisturizer throughout the day- vitamin E or coconut oil as needed. Do not wash vulva with soap, only water.   Return as needed  Jaquita Folds, MD  Time spent: I spent 25 minutes dedicated to the care of this patient on the date of this encounter to include pre-visit review of records, face-to-face  time with the patient and post visit  documentation.

## 2022-07-03 NOTE — Patient Instructions (Signed)
Stay consistent with the estrogen twice a week.   Vulvovaginal moisturizer Options: Vitamin E oil (pump or capsule) or cream (Gene's Vit E Cream) Coconut oil V-magic (can get on [place] Silicone-based lubricant for use during intercourse ("wet platinum" is a brand available at most drugstores) Crisco Consider the ingredients of the product - the fewer the ingredients the better!  Directions for Use: Clean and dry your hands Gently dab the vulvar/vaginal area dry as needed Apply a "pea-sized" amount of the moisturizer onto your fingertip Using you other hand, open the labia  Apply the moisturizer to the vulvar/vaginal tissues Wear loose fitting underwear/clothing if possible following application Use moisturize up to 3 times daily as desired.

## 2022-07-04 LAB — URINE CULTURE: Culture: <10000 — AB

## 2022-07-04 LAB — CERVICOVAGINAL ANCILLARY ONLY
Bacterial Vaginitis (gardnerella): NEGATIVE
Candida Glabrata: NEGATIVE
Candida Vaginitis: NEGATIVE
Comment: NEGATIVE
Comment: NEGATIVE
Comment: NEGATIVE

## 2022-07-06 ENCOUNTER — Telehealth: Payer: Self-pay

## 2022-07-09 NOTE — Telephone Encounter (Signed)
Called patient stating I am trying to reach her to return her phone call. Patient states she is confused about her results. Per chart review, patient sees Uro/Gyn- provided appropriate phone number to patient for her to call. Patient verbalized understanding.

## 2022-07-10 ENCOUNTER — Telehealth: Payer: Self-pay

## 2022-07-10 ENCOUNTER — Ambulatory Visit: Payer: No Typology Code available for payment source | Admitting: Obstetrics and Gynecology

## 2022-07-11 NOTE — Telephone Encounter (Signed)
Pt was contacted

## 2022-07-19 ENCOUNTER — Encounter: Payer: Self-pay | Admitting: *Deleted

## 2022-08-06 DIAGNOSIS — C50919 Malignant neoplasm of unspecified site of unspecified female breast: Secondary | ICD-10-CM

## 2022-08-06 HISTORY — DX: Malignant neoplasm of unspecified site of unspecified female breast: C50.919

## 2022-08-21 DIAGNOSIS — L299 Pruritus, unspecified: Secondary | ICD-10-CM | POA: Diagnosis not present

## 2022-08-21 DIAGNOSIS — H608X1 Other otitis externa, right ear: Secondary | ICD-10-CM | POA: Insufficient documentation

## 2022-08-21 DIAGNOSIS — H6121 Impacted cerumen, right ear: Secondary | ICD-10-CM | POA: Diagnosis not present

## 2022-08-27 ENCOUNTER — Ambulatory Visit (INDEPENDENT_AMBULATORY_CARE_PROVIDER_SITE_OTHER): Payer: No Typology Code available for payment source | Admitting: Family Medicine

## 2022-08-27 ENCOUNTER — Encounter: Payer: Self-pay | Admitting: Family Medicine

## 2022-08-27 VITALS — BP 113/70 | HR 86 | Temp 98.2°F | Ht 64.0 in | Wt 213.8 lb

## 2022-08-27 DIAGNOSIS — N761 Subacute and chronic vaginitis: Secondary | ICD-10-CM

## 2022-08-27 DIAGNOSIS — R531 Weakness: Secondary | ICD-10-CM

## 2022-08-27 DIAGNOSIS — E7800 Pure hypercholesterolemia, unspecified: Secondary | ICD-10-CM | POA: Diagnosis not present

## 2022-08-27 DIAGNOSIS — E038 Other specified hypothyroidism: Secondary | ICD-10-CM | POA: Diagnosis not present

## 2022-08-27 DIAGNOSIS — R7303 Prediabetes: Secondary | ICD-10-CM

## 2022-08-27 DIAGNOSIS — I10 Essential (primary) hypertension: Secondary | ICD-10-CM

## 2022-08-27 LAB — CBC WITH DIFFERENTIAL/PLATELET
Basophils Absolute: 0.1 10*3/uL (ref 0.0–0.1)
Basophils Relative: 1 % (ref 0.0–3.0)
Eosinophils Absolute: 0.3 10*3/uL (ref 0.0–0.7)
Eosinophils Relative: 3.6 % (ref 0.0–5.0)
HCT: 43.4 % (ref 36.0–46.0)
Hemoglobin: 14.6 g/dL (ref 12.0–15.0)
Lymphocytes Relative: 19.8 % (ref 12.0–46.0)
Lymphs Abs: 1.5 10*3/uL (ref 0.7–4.0)
MCHC: 33.6 g/dL (ref 30.0–36.0)
MCV: 96.5 fl (ref 78.0–100.0)
Monocytes Absolute: 0.7 10*3/uL (ref 0.1–1.0)
Monocytes Relative: 8.8 % (ref 3.0–12.0)
Neutro Abs: 5 10*3/uL (ref 1.4–7.7)
Neutrophils Relative %: 66.8 % (ref 43.0–77.0)
Platelets: 282 10*3/uL (ref 150.0–400.0)
RBC: 4.5 Mil/uL (ref 3.87–5.11)
RDW: 13.3 % (ref 11.5–15.5)
WBC: 7.5 10*3/uL (ref 4.0–10.5)

## 2022-08-27 LAB — LIPID PANEL
Cholesterol: 222 mg/dL — ABNORMAL HIGH (ref 0–200)
HDL: 40.2 mg/dL (ref >39.00)
NonHDL: 181.73
Total CHOL/HDL Ratio: 6
Triglycerides: 257 mg/dL — ABNORMAL HIGH (ref 0.0–149.0)
VLDL: 51.4 mg/dL — ABNORMAL HIGH (ref 0.0–40.0)

## 2022-08-27 LAB — COMPREHENSIVE METABOLIC PANEL
ALT: 92 U/L — ABNORMAL HIGH (ref 0–35)
AST: 60 U/L — ABNORMAL HIGH (ref 0–37)
Albumin: 4.3 g/dL (ref 3.5–5.2)
Alkaline Phosphatase: 66 U/L (ref 39–117)
BUN: 15 mg/dL (ref 6–23)
CO2: 28 mEq/L (ref 19–32)
Calcium: 9.8 mg/dL (ref 8.4–10.5)
Chloride: 101 mEq/L (ref 96–112)
Creatinine, Ser: 0.5 mg/dL (ref 0.40–1.20)
GFR: 92.9 mL/min (ref >60.00)
Glucose, Bld: 147 mg/dL — ABNORMAL HIGH (ref 70–99)
Potassium: 3.9 mEq/L (ref 3.5–5.1)
Sodium: 139 mEq/L (ref 135–145)
Total Bilirubin: 0.5 mg/dL (ref 0.2–1.2)
Total Protein: 7 g/dL (ref 6.0–8.3)

## 2022-08-27 LAB — LDL CHOLESTEROL, DIRECT: Direct LDL: 162 mg/dL

## 2022-08-27 LAB — T3, FREE: T3, Free: 3.7 pg/mL (ref 2.3–4.2)

## 2022-08-27 LAB — TSH: TSH: 2.59 u[IU]/mL (ref 0.35–5.50)

## 2022-08-27 LAB — T4, FREE: Free T4: 0.56 ng/dL — ABNORMAL LOW (ref 0.60–1.60)

## 2022-08-27 LAB — HEMOGLOBIN A1C: Hgb A1c MFr Bld: 6.5 % (ref 4.6–6.5)

## 2022-08-27 MED ORDER — FLUCONAZOLE 150 MG PO TABS: 150.0000 mg | ORAL_TABLET | ORAL | 0 refills | Status: DC | PRN

## 2022-08-27 NOTE — Progress Notes (Signed)
1.  Sugars technically diabetes now-does she want metformin '500mg'$  bid or work on diet/exercise? 2.  Cholesterol too high-increase crestor to '20mg'$  daily.  Reck lft's in 3 months 3.  LFT's same-needs to work on diet/exercise and consider metformin. 4.  I reviewed some of her labs that were done before today.  I noticed that the test was done for yeast and it was all negative.  Therefore, I recommend not taking the Diflucan, or possibly just taking 1 dose. 5.  Continue same dose thyroid

## 2022-08-27 NOTE — Progress Notes (Signed)
Subjective:     Patient ID: Vertell Limber, female    DOB: 07-20-1951, 72 y.o.   MRN: [id number]  Chief Complaint  Patient presents with   Hypertension    Pt having sx of candida, states she needs treatment for such.   Follow-up   Prediabetes    HPI  HTN-Pt is on benicar 20.  Bp's running 120/80.  No ha/dizziness/cp/palp/edema/cough/sob  HLD-on crestor '10mg'$  PreDM-not working on diet/exercise(had bad virus). Also has fatty liver.  Hypothyroidism-on armour 120 Vaginal candida-usu needs diflucan more than 1-2.  A lot of vaginal itching.  No d/c. Used otc and not help. Saw uro/gyn-on estrogen cream 2x/wk.  No dryer sheets. No soap, laundry soap-arm and hammer. Some odor(more metallic). Alt doc did daily for 2 wks. 5-6 yrs ago.  Health Maintenance Due  Topic Date Due   DTaP/Tdap/Td (1 - Tdap) Never done    Past Medical History:  Diagnosis Date   Allergy    Anemia    Arthritis    Blood transfusion without reported diagnosis    Chronic headaches    Diverticulosis    Hyperlipidemia    Hypertension    Hypothyroidism    IBS (irritable bowel syndrome)    Prediabetes    Recurrent UTI     Past Surgical History:  Procedure Laterality Date   BREAST BIOPSY Right 2019   IR ANGIOGRAM SELECTIVE EACH ADDITIONAL VESSEL  09/28/2020   IR ANGIOGRAM SELECTIVE EACH ADDITIONAL VESSEL  09/28/2020   IR ANGIOGRAM SELECTIVE EACH ADDITIONAL VESSEL  09/28/2020   IR ANGIOGRAM SELECTIVE EACH ADDITIONAL VESSEL  09/28/2020   IR ANGIOGRAM VISCERAL SELECTIVE  09/28/2020   IR ANGIOGRAM VISCERAL SELECTIVE  09/28/2020   IR EMBO ART  VEN HEMORR LYMPH EXTRAV  INC GUIDE ROADMAPPING  09/28/2020   IR US GUIDE VASC ACCESS RIGHT  09/28/2020   TONSILLECTOMY      Outpatient Medications Prior to Visit  Medication Sig Dispense Refill   AMBULATORY NON FORMULARY MEDICATION Bone Up 1 tablet in the morning and 2 in the afternoon     ARMOUR THYROID 120 MG tablet Take 1 tablet (120 mg total) by mouth daily. 90  tablet 3   Boswellia Serrata (BOSWELLIA PO) Take 2 tablets by mouth 2 (two) times daily.     butalbital-acetaminophen-caffeine (FIORICET) 50-325-40 MG tablet Take 1 tablet by mouth every 6 (six) hours as needed for headache. 14 tablet 0   CALCIUM PO Take 1 tablet by mouth daily.     Carboxymethylcellulose Sodium (EYE DROPS OP) Apply 2 drops to eye 2 (two) times daily as needed (dry eyes).     CHONDROITIN SULFATE PO Take 1 tablet by mouth daily.     clotrimazole-betamethasone (LOTRISONE) cream Apply a small amount to affected area 1-2 times per day for 14 days, then stop and use as needed.     Coenzyme Q10 (CO Q-10 PO) Take 1 tablet by mouth in the morning and at bedtime.     DANDELION ROOT PO Take 1 tablet by mouth 2 (two) times daily.     dexamethasone (DECADRON) 0.1 % ophthalmic solution SMARTSIG:2 Drop(s) In Ear(s)     dicyclomine (BENTYL) 10 MG capsule TAKE 1 CAPSULE (10 MG TOTAL) BY MOUTH EVERY 8 (EIGHT) HOURS AS NEEDED FOR SPASMS. 270 capsule 3   Digestive Enzymes (ENZYME DIGEST) CAPS Take 2 capsules by mouth daily.     estradiol (CLIMARA - DOSED IN MG/24 HR) 0.05 mg/24hr patch APPLY 1 PATCH ONCE EVERY WEEK  estradiol (ESTRACE) 0.1 MG/GM vaginal cream Place 0.5 g vaginally 2 (two) times a week. Place 0.5g nightly for two weeks then twice a week after 30 g 11   estradiol (ESTRACE) 0.1 MG/GM vaginal cream Place 1 Applicatorful vaginally 2 (two) times a week.     Fluocinolone Acetonide 0.01 % OIL INSTILL 4 DROPS NIGHTLY INTO BOTH EARS FOR 14 DAYS. THEN STOP AND USE AS NEEDED FOR ITCHING.     fluticasone (FLONASE) 50 MCG/ACT nasal spray Place 2 sprays into both nostrils daily.     Glucosamine-Chondroitin 500-400 MG CAPS Take 1 tablet by mouth 3 (three) times daily.     Magnesium 110 MG CAPS Take 2 capsules by mouth 2 (two) times daily.     olmesartan (BENICAR) 20 MG tablet Take 1 tablet (20 mg total) by mouth daily. 90 tablet 3   Omega-3 Fatty Acids (FISH OIL PO) Take 1 tablet by mouth 2  (two) times daily.     OVER THE COUNTER MEDICATION Take 1 tablet by mouth daily. Bone up bone supplement     phenazopyridine (PYRIDIUM) 200 MG tablet Take 1 tablet (200 mg total) by mouth 3 (three) times daily as needed for pain. 15 tablet 0   progesterone (PROMETRIUM) 200 MG capsule Take 200 mg by mouth daily.     QUERCETIN PO Take 2 tablets by mouth 3 (three) times daily.     rosuvastatin (CRESTOR) 10 MG tablet Take 10 mg by mouth daily.     Thiamine HCl (VITAMIN B-1 PO) Take 1 tablet by mouth daily.     VITAMIN D, CHOLECALCIFEROL, PO Take 10,000 Units by mouth daily.      Zinc Sulfate 66 MG TABS Take 132 mg by mouth daily.     fluconazole (DIFLUCAN) 150 MG tablet Take first tablet on day 4 of ciprofloxacin.  Take second tablet 3 days after first tablet.  Take third tablet 3 days after second tablet if needed. 3 tablet 1   No facility-administered medications prior to visit.    Allergies  Allergen Reactions   Dairycare [Lactase-Lactobacillus] Cough   June Grass Pollen Standardized [Timothy Grass Pollen Allergen] Other (See Comments)   Wheat Bran Cough   ROS neg/noncontributory except as noted HPI/below  Still some weakness on L side didn't do PT as was doing pelvic therapy.      Objective:     BP 113/70 (BP Location: Right Arm, Patient Position: Sitting)   Pulse 86   Temp 98.2 F (36.8 C) (Temporal)   Ht '5\' 4"'$  (1.626 m)   Wt 213 lb 12.8 oz (97 kg)   SpO2 96%   BMI 36.70 kg/m  Wt Readings from Last 3 Encounters:  08/27/22 213 lb 12.8 oz (97 kg)  03/20/22 206 lb (93.4 kg)  03/02/22 210 lb 6 oz (95.4 kg)    Physical Exam   Gen: WDWN NAD HEENT: NCAT, conjunctiva not injected, sclera nonicteric NECK:  supple, no thyromegaly, no nodes, no carotid bruits CARDIAC: RRR, S1S2+, no murmur. DP 2+B LUNGS: CTAB. No wheezes ABDOMEN:  BS+, soft, NTND, No HSM, no masses EXT:  no edema MSK: no gross abnormalities.  NEURO: A&O x3.  CN II-XII intact.  PSYCH: normal mood. Good eye  contact     Assessment & Plan:   Problem List Items Addressed This Visit       Cardiovascular and Mediastinum   Hypertension - Primary   Relevant Orders   Comprehensive metabolic panel (Completed)   Lipid panel (Completed)  CBC with Differential/Platelet (Completed)     Endocrine   Hypothyroidism   Relevant Orders   TSH (Completed)   T3, free (Completed)   T4, free (Completed)     Other   HLD (hyperlipidemia)   Relevant Orders   Comprehensive metabolic panel (Completed)   Lipid panel (Completed)   Prediabetes   Relevant Orders   Hemoglobin A1c (Completed)   Other Visit Diagnoses     Weakness       Relevant Orders   Ambulatory referral to Physical Therapy   Subacute vaginitis         1.  Hypertension-chronic.  Well-controlled.  Continue Benicar 20 mg daily.  Check CBC, CMP 2.  Hyperlipidemia-chronic.  Controlled.  Continue Crestor 10 mg daily.  Check CMP, lipids 3.  Hypothyroidism-chronic.  Well-controlled on Armour Thyroid 120.  Patient wants to continue on this regimen.  Check TSH, free T3, free T4 4.  Prediabetes-patient aware of risk of diabetes.  Has not been doing very well on her diet/exercise.  Advised that she needs to do better especially since she has fatty liver as well.  Consider metformin.  Check A1c, CMP 5.  Chronic weakness-rerefer to physical therapy. 6.  Vaginitis-patient has noticed increased itching, odor.  She is on Estrace twice a week.  She has done Diflucan in the past.  Advised to use with caution due to elevated LFTs.  Will check LFTs  Meds ordered this encounter  Medications   fluconazole (DIFLUCAN) 150 MG tablet    Sig: Take 1 tablet (150 mg total) by mouth every three (3) days as needed.    Dispense:  10 tablet    Refill:  0    Wellington Hampshire, MD

## 2022-08-27 NOTE — Patient Instructions (Signed)
It was very nice to see you today!  Take diflucan after labs back.   PLEASE NOTE:  If you had any lab tests please let us know if you have not heard back within a few days. You may see your results on MyChart before we have a chance to review them but we will give you a call once they are reviewed by [place]. If we ordered any referrals today, please let us know if you have not heard from their office within the next week.   Please try these tips to maintain a healthy lifestyle:  Eat most of your calories during the day when you are active. Eliminate processed foods including packaged sweets (pies, cakes, cookies), reduce intake of potatoes, white bread, white pasta, and white rice. Look for whole grain options, oat flour or almond flour.  Each meal should contain half fruits/vegetables, one quarter protein, and one quarter carbs (no bigger than a computer mouse).  Cut down on sweet beverages. This includes juice, soda, and sweet tea. Also watch fruit intake, though this is a healthier sweet option, it still contains natural sugar! Limit to 3 servings daily.  Drink at least 1 glass of water with each meal and aim for at least 8 glasses per day  Exercise at least 150 minutes every week.

## 2022-08-28 ENCOUNTER — Other Ambulatory Visit: Payer: Self-pay | Admitting: *Deleted

## 2022-08-28 ENCOUNTER — Telehealth: Payer: Self-pay | Admitting: Family Medicine

## 2022-08-28 DIAGNOSIS — E7800 Pure hypercholesterolemia, unspecified: Secondary | ICD-10-CM

## 2022-08-28 NOTE — Telephone Encounter (Signed)
Patient notified and verbalized understanding. 

## 2022-08-28 NOTE — Telephone Encounter (Signed)
Following discussion of labs with Noelle Penner, pt would like to know if PCP could prescribe her a blood sugar monitor. States she doesn't have a specific one in mind but would like it to be easy to use. Please Advise.

## 2022-08-28 NOTE — Telephone Encounter (Signed)
Please advise 

## 2022-08-29 ENCOUNTER — Other Ambulatory Visit: Payer: Self-pay | Admitting: *Deleted

## 2022-08-29 DIAGNOSIS — E1165 Type 2 diabetes mellitus with hyperglycemia: Secondary | ICD-10-CM

## 2022-08-29 MED ORDER — ONETOUCH ULTRASOFT LANCETS MISC: 2 refills | Status: DC

## 2022-08-29 MED ORDER — ONETOUCH VERIO W/DEVICE KIT: PACK | 0 refills | Status: DC

## 2022-08-29 MED ORDER — ONETOUCH VERIO VI STRP: ORAL_STRIP | 2 refills | Status: DC

## 2022-08-29 NOTE — Telephone Encounter (Signed)
Pt states the monitors that are covered by insurance are Freestyle 1 touch verio - standard and Freestyle Libre - continuance glucose monitor. Please advise.

## 2022-08-29 NOTE — Telephone Encounter (Signed)
Monitor with strips and lancets sent to the pharmacy.

## 2022-08-30 DIAGNOSIS — H5213 Myopia, bilateral: Secondary | ICD-10-CM | POA: Diagnosis not present

## 2022-08-30 DIAGNOSIS — H2513 Age-related nuclear cataract, bilateral: Secondary | ICD-10-CM | POA: Diagnosis not present

## 2022-08-30 DIAGNOSIS — H52223 Regular astigmatism, bilateral: Secondary | ICD-10-CM | POA: Diagnosis not present

## 2022-08-30 DIAGNOSIS — E119 Type 2 diabetes mellitus without complications: Secondary | ICD-10-CM | POA: Diagnosis not present

## 2022-08-30 LAB — HM DIABETES EYE EXAM

## 2022-08-30 NOTE — Telephone Encounter (Signed)
Noted  

## 2022-08-30 NOTE — Telephone Encounter (Signed)
Patient states:  - She looked up the freestyle libre- continuance glucose monitor and no longer wants this since it looks complicated  -She will go on and use the one touch verio that was sent into the pharmacy   Patient requests: -Clarification on why PCP decided the one touch verio would be better instead of the freestyle libre

## 2022-08-30 NOTE — Telephone Encounter (Signed)
Pt called back and prefers Freestyle Libra - continuance glucose monitor. Please advise.

## 2022-10-18 DIAGNOSIS — K5730 Diverticulosis of large intestine without perforation or abscess without bleeding: Secondary | ICD-10-CM | POA: Insufficient documentation

## 2022-10-18 DIAGNOSIS — Z124 Encounter for screening for malignant neoplasm of cervix: Secondary | ICD-10-CM | POA: Diagnosis not present

## 2022-10-18 DIAGNOSIS — L900 Lichen sclerosus et atrophicus: Secondary | ICD-10-CM | POA: Diagnosis not present

## 2022-10-18 DIAGNOSIS — N959 Unspecified menopausal and perimenopausal disorder: Secondary | ICD-10-CM | POA: Diagnosis not present

## 2022-10-18 DIAGNOSIS — Z6834 Body mass index (BMI) 34.0-34.9, adult: Secondary | ICD-10-CM | POA: Diagnosis not present

## 2022-10-18 DIAGNOSIS — N760 Acute vaginitis: Secondary | ICD-10-CM | POA: Diagnosis not present

## 2022-11-13 DIAGNOSIS — L900 Lichen sclerosus et atrophicus: Secondary | ICD-10-CM | POA: Diagnosis not present

## 2022-11-13 DIAGNOSIS — Z1231 Encounter for screening mammogram for malignant neoplasm of breast: Secondary | ICD-10-CM | POA: Diagnosis not present

## 2022-11-22 ENCOUNTER — Other Ambulatory Visit: Payer: Self-pay | Admitting: Obstetrics & Gynecology

## 2022-11-22 DIAGNOSIS — R928 Other abnormal and inconclusive findings on diagnostic imaging of breast: Secondary | ICD-10-CM

## 2022-11-26 ENCOUNTER — Other Ambulatory Visit: Payer: No Typology Code available for payment source

## 2022-11-29 ENCOUNTER — Other Ambulatory Visit: Payer: Self-pay | Admitting: Family Medicine

## 2022-11-29 ENCOUNTER — Other Ambulatory Visit (INDEPENDENT_AMBULATORY_CARE_PROVIDER_SITE_OTHER): Payer: No Typology Code available for payment source

## 2022-11-29 DIAGNOSIS — E1165 Type 2 diabetes mellitus with hyperglycemia: Secondary | ICD-10-CM | POA: Diagnosis not present

## 2022-11-29 DIAGNOSIS — E7800 Pure hypercholesterolemia, unspecified: Secondary | ICD-10-CM

## 2022-11-29 DIAGNOSIS — E782 Mixed hyperlipidemia: Secondary | ICD-10-CM

## 2022-11-29 LAB — HEPATIC FUNCTION PANEL
ALT: 57 U/L — ABNORMAL HIGH (ref 0–35)
AST: 36 U/L (ref 0–37)
Albumin: 4.4 g/dL (ref 3.5–5.2)
Alkaline Phosphatase: 64 U/L (ref 39–117)
Bilirubin, Direct: 0.2 mg/dL (ref 0.0–0.3)
Total Bilirubin: 0.7 mg/dL (ref 0.2–1.2)
Total Protein: 7.1 g/dL (ref 6.0–8.3)

## 2022-11-29 LAB — HEMOGLOBIN A1C: Hgb A1c MFr Bld: 6.4 % (ref 4.6–6.5)

## 2022-11-29 NOTE — Addendum Note (Signed)
Addended by: Laddie Aquas A on: 11/29/2022 12:04 PM   Modules accepted: Orders

## 2022-11-30 ENCOUNTER — Other Ambulatory Visit (INDEPENDENT_AMBULATORY_CARE_PROVIDER_SITE_OTHER): Payer: No Typology Code available for payment source

## 2022-11-30 DIAGNOSIS — E782 Mixed hyperlipidemia: Secondary | ICD-10-CM | POA: Diagnosis not present

## 2022-11-30 LAB — LIPID PANEL
Cholesterol: 195 mg/dL (ref 0–200)
HDL: 42.6 mg/dL (ref >39.00)
LDL Cholesterol: 112 mg/dL — ABNORMAL HIGH (ref 0–99)
NonHDL: 151.98
Total CHOL/HDL Ratio: 5
Triglycerides: 200 mg/dL — ABNORMAL HIGH (ref 0.0–149.0)
VLDL: 40 mg/dL (ref 0.0–40.0)

## 2022-11-30 NOTE — Addendum Note (Signed)
Addended by: Jobe Gibbon on: 11/30/2022 09:22 AM   Modules accepted: Orders

## 2022-11-30 NOTE — Progress Notes (Signed)
Much better.  Same dose medications and continue working on diet/exercise to reduce the triglycerides.  And can increase the fish oil to 4/day

## 2022-11-30 NOTE — Progress Notes (Signed)
Sugar is a little better.  Liver some better.  Cholesterol missed so trying to add it.  Keep working on diet/exercise

## 2022-12-14 ENCOUNTER — Ambulatory Visit
Admission: RE | Admit: 2022-12-14 | Discharge: 2022-12-14 | Disposition: A | Discharge status: Home / Self Care | Payer: No Typology Code available for payment source | Source: Ambulatory Visit | Attending: Obstetrics & Gynecology | Admitting: Obstetrics & Gynecology

## 2022-12-14 ENCOUNTER — Other Ambulatory Visit: Payer: Self-pay | Admitting: Obstetrics & Gynecology

## 2022-12-14 DIAGNOSIS — R928 Other abnormal and inconclusive findings on diagnostic imaging of breast: Secondary | ICD-10-CM

## 2022-12-14 DIAGNOSIS — N6310 Unspecified lump in the right breast, unspecified quadrant: Secondary | ICD-10-CM

## 2022-12-20 DIAGNOSIS — L408 Other psoriasis: Secondary | ICD-10-CM | POA: Diagnosis not present

## 2022-12-20 DIAGNOSIS — L400 Psoriasis vulgaris: Secondary | ICD-10-CM | POA: Diagnosis not present

## 2022-12-25 ENCOUNTER — Ambulatory Visit
Admission: RE | Admit: 2022-12-25 | Discharge: 2022-12-25 | Disposition: A | Discharge status: Home / Self Care | Payer: No Typology Code available for payment source | Source: Ambulatory Visit | Attending: Obstetrics & Gynecology | Admitting: Obstetrics & Gynecology

## 2022-12-25 ENCOUNTER — Ambulatory Visit: Payer: No Typology Code available for payment source | Admitting: Obstetrics and Gynecology

## 2022-12-25 DIAGNOSIS — C50511 Malignant neoplasm of lower-outer quadrant of right female breast: Secondary | ICD-10-CM | POA: Diagnosis not present

## 2022-12-25 DIAGNOSIS — N6310 Unspecified lump in the right breast, unspecified quadrant: Secondary | ICD-10-CM

## 2022-12-25 HISTORY — PX: BREAST BIOPSY: SHX20

## 2022-12-26 ENCOUNTER — Encounter: Payer: Self-pay | Admitting: Obstetrics & Gynecology

## 2022-12-26 ENCOUNTER — Other Ambulatory Visit: Payer: Self-pay | Admitting: Obstetrics & Gynecology

## 2022-12-26 DIAGNOSIS — N6310 Unspecified lump in the right breast, unspecified quadrant: Secondary | ICD-10-CM

## 2022-12-26 DIAGNOSIS — C50919 Malignant neoplasm of unspecified site of unspecified female breast: Secondary | ICD-10-CM | POA: Insufficient documentation

## 2022-12-27 ENCOUNTER — Telehealth: Payer: Self-pay | Admitting: Hematology and Oncology

## 2022-12-27 NOTE — Telephone Encounter (Signed)
Spoke to patient to confirm upcoming afternoon [facility] clinic appointment  on 6/5, paperwork will be sent via mail.   Gave location and time, also informed patient that the surgeon's office would be calling as well to get information from them similar to the packet that they will be receiving so make sure to do both.  Reminded patient that all providers will be coming to the clinic to see them HERE and if they had any questions to not hesitate to reach back out to myself or their navigators.

## 2023-01-01 ENCOUNTER — Ambulatory Visit
Admission: RE | Admit: 2023-01-01 | Discharge: 2023-01-01 | Disposition: A | Discharge status: Home / Self Care | Payer: No Typology Code available for payment source | Source: Ambulatory Visit | Attending: Obstetrics & Gynecology | Admitting: Obstetrics & Gynecology

## 2023-01-01 DIAGNOSIS — N6310 Unspecified lump in the right breast, unspecified quadrant: Secondary | ICD-10-CM

## 2023-01-01 DIAGNOSIS — C50511 Malignant neoplasm of lower-outer quadrant of right female breast: Secondary | ICD-10-CM | POA: Diagnosis not present

## 2023-01-01 HISTORY — PX: BREAST BIOPSY: SHX20

## 2023-01-08 ENCOUNTER — Encounter: Payer: Self-pay | Admitting: *Deleted

## 2023-01-08 DIAGNOSIS — Z170 Estrogen receptor positive status [ER+]: Secondary | ICD-10-CM | POA: Insufficient documentation

## 2023-01-08 DIAGNOSIS — C50511 Malignant neoplasm of lower-outer quadrant of right female breast: Secondary | ICD-10-CM | POA: Insufficient documentation

## 2023-01-09 ENCOUNTER — Inpatient Hospital Stay: Payer: No Typology Code available for payment source

## 2023-01-09 ENCOUNTER — Inpatient Hospital Stay
Payer: No Typology Code available for payment source | Attending: Hematology and Oncology | Admitting: Hematology and Oncology

## 2023-01-09 ENCOUNTER — Ambulatory Visit
Admission: RE | Admit: 2023-01-09 | Discharge: 2023-01-09 | Disposition: A | Discharge status: Home / Self Care | Payer: No Typology Code available for payment source | Source: Ambulatory Visit | Attending: Radiation Oncology | Admitting: Radiation Oncology

## 2023-01-09 ENCOUNTER — Other Ambulatory Visit: Payer: Self-pay

## 2023-01-09 ENCOUNTER — Ambulatory Visit: Payer: No Typology Code available for payment source | Attending: Surgery | Admitting: Physical Therapy

## 2023-01-09 ENCOUNTER — Inpatient Hospital Stay: Payer: No Typology Code available for payment source | Admitting: Genetic Counselor

## 2023-01-09 ENCOUNTER — Ambulatory Visit: Payer: Self-pay | Admitting: Surgery

## 2023-01-09 ENCOUNTER — Encounter: Payer: Self-pay | Admitting: Physical Therapy

## 2023-01-09 ENCOUNTER — Encounter: Payer: Self-pay | Admitting: *Deleted

## 2023-01-09 ENCOUNTER — Ambulatory Visit: Payer: No Typology Code available for payment source | Admitting: Physical Therapy

## 2023-01-09 VITALS — BP 132/60 | HR 90 | Temp 97.7°F | Resp 18 | Ht 64.0 in | Wt 209.9 lb

## 2023-01-09 DIAGNOSIS — Z170 Estrogen receptor positive status [ER+]: Secondary | ICD-10-CM

## 2023-01-09 DIAGNOSIS — M25612 Stiffness of left shoulder, not elsewhere classified: Secondary | ICD-10-CM | POA: Diagnosis not present

## 2023-01-09 DIAGNOSIS — Z853 Personal history of malignant neoplasm of breast: Secondary | ICD-10-CM | POA: Diagnosis not present

## 2023-01-09 DIAGNOSIS — C50911 Malignant neoplasm of unspecified site of right female breast: Secondary | ICD-10-CM

## 2023-01-09 DIAGNOSIS — C50511 Malignant neoplasm of lower-outer quadrant of right female breast: Secondary | ICD-10-CM

## 2023-01-09 DIAGNOSIS — R293 Abnormal posture: Secondary | ICD-10-CM | POA: Diagnosis not present

## 2023-01-09 DIAGNOSIS — C50512 Malignant neoplasm of lower-outer quadrant of left female breast: Secondary | ICD-10-CM | POA: Diagnosis not present

## 2023-01-09 DIAGNOSIS — M25611 Stiffness of right shoulder, not elsewhere classified: Secondary | ICD-10-CM | POA: Diagnosis not present

## 2023-01-09 LAB — CBC WITH DIFFERENTIAL (CANCER CENTER ONLY)
Abs Immature Granulocytes: 0.02 10*3/uL (ref 0.00–0.07)
Basophils Absolute: 0.1 10*3/uL (ref 0.0–0.1)
Basophils Relative: 1 %
Eosinophils Absolute: 0.3 10*3/uL (ref 0.0–0.5)
Eosinophils Relative: 4 %
HCT: 45.4 % (ref 36.0–46.0)
Hemoglobin: 14.9 g/dL (ref 12.0–15.0)
Immature Granulocytes: 0 %
Lymphocytes Relative: 20 %
Lymphs Abs: 1.6 10*3/uL (ref 0.7–4.0)
MCH: 31.5 pg (ref 26.0–34.0)
MCHC: 32.8 g/dL (ref 30.0–36.0)
MCV: 96 fL (ref 80.0–100.0)
Monocytes Absolute: 0.8 10*3/uL (ref 0.1–1.0)
Monocytes Relative: 9 %
Neutro Abs: 5.4 10*3/uL (ref 1.7–7.7)
Neutrophils Relative %: 66 %
Platelet Count: 283 10*3/uL (ref 150–400)
RBC: 4.73 MIL/uL (ref 3.87–5.11)
RDW: 13 % (ref 11.5–15.5)
WBC Count: 8.2 10*3/uL (ref 4.0–10.5)
nRBC: 0 % (ref 0.0–0.2)

## 2023-01-09 LAB — CMP (CANCER CENTER ONLY)
ALT: 77 U/L — ABNORMAL HIGH (ref 0–44)
AST: 52 U/L — ABNORMAL HIGH (ref 15–41)
Albumin: 4.6 g/dL (ref 3.5–5.0)
Alkaline Phosphatase: 73 U/L (ref 38–126)
Anion gap: 7 (ref 5–15)
BUN: 15 mg/dL (ref 8–23)
CO2: 29 mmol/L (ref 22–32)
Calcium: 10.3 mg/dL (ref 8.9–10.3)
Chloride: 104 mmol/L (ref 98–111)
Creatinine: 0.65 mg/dL (ref 0.44–1.00)
GFR, Estimated: >60 mL/min (ref >60)
Glucose, Bld: 169 mg/dL — ABNORMAL HIGH (ref 70–99)
Potassium: 4.2 mmol/L (ref 3.5–5.1)
Sodium: 140 mmol/L (ref 135–145)
Total Bilirubin: 0.7 mg/dL (ref 0.3–1.2)
Total Protein: 7.6 g/dL (ref 6.5–8.1)

## 2023-01-09 LAB — GENETIC SCREENING ORDER

## 2023-01-09 NOTE — Therapy (Signed)
OUTPATIENT PHYSICAL THERAPY BREAST CANCER BASELINE EVALUATION   Patient Name: Monica Rogers MRN: [id number] DOB:1950-09-19, 72 y.o., female Today's Date: 01/09/2023  END OF SESSION:  PT End of Session - 01/09/23 1735     Visit Number 1    Number of Visits 2    Date for PT Re-Evaluation 03/06/23    PT Start Time 1520    PT Stop Time 1551    PT Time Calculation (min) 31 min    Activity Tolerance Patient tolerated treatment well    Behavior During Therapy WFL for tasks assessed/performed             Past Medical History:  Diagnosis Date   Allergy    Anemia    Arthritis    Blood transfusion without reported diagnosis    Chronic headaches    Diverticulosis    Hyperlipidemia    Hypertension    Hypothyroidism    IBS (irritable bowel syndrome)    Prediabetes    Recurrent UTI    Past Surgical History:  Procedure Laterality Date   BREAST BIOPSY Right 2019   BREAST BIOPSY Right 12/25/2022   [place] RT BREAST BX W LOC DEV 1ST LESION IMG BX SPEC US GUIDE 12/25/2022 [facility]   BREAST BIOPSY Right 01/01/2023   [place] RT BREAST BX W LOC DEV 1ST LESION IMG BX SPEC US GUIDE 01/01/2023 [facility]   IR ANGIOGRAM SELECTIVE EACH ADDITIONAL VESSEL  09/28/2020   IR ANGIOGRAM SELECTIVE EACH ADDITIONAL VESSEL  09/28/2020   IR ANGIOGRAM SELECTIVE EACH ADDITIONAL VESSEL  09/28/2020   IR ANGIOGRAM SELECTIVE EACH ADDITIONAL VESSEL  09/28/2020   IR ANGIOGRAM VISCERAL SELECTIVE  09/28/2020   IR ANGIOGRAM VISCERAL SELECTIVE  09/28/2020   IR EMBO ART  VEN HEMORR LYMPH EXTRAV  INC GUIDE ROADMAPPING  09/28/2020   IR US GUIDE VASC ACCESS RIGHT  09/28/2020   TONSILLECTOMY     Patient Active Problem List   Diagnosis Date Noted   Malignant neoplasm of lower-outer quadrant of right breast of female, estrogen receptor positive (HCC) 01/08/2023   Prediabetes 02/22/2022   Lower GI bleed 09/29/2020   Hypothyroidism 09/29/2020   Acute blood loss anemia 09/29/2020   Sigmoid diverticulitis  09/28/2020   Hypertension    KNEE PAIN, RIGHT 11/18/2009   ANKLE PAIN, RIGHT 11/18/2009   SINUS TARSI SYNDROME, RIGHT FOOT 11/18/2009   PLANTAR FASCIITIS, LEFT 11/18/2009   HYPERTHYROIDISM 04/23/2008   HLD (hyperlipidemia) 04/23/2008   OVERWEIGHT 04/23/2008   ANXIETY 04/23/2008   HEADACHE, CHRONIC 04/23/2008   NONSPECIFIC ABNORMAL RESULTS LIVR FUNCTION STUDY 04/23/2008   ANEMIA, DEFICIENCY, HX OF 04/23/2008    REFERRING PROVIDER: Dr. Harriette Bouillon  REFERRING DIAG: Right breast cancer   THERAPY DIAG:  Malignant neoplasm of lower-outer quadrant of right breast of female, estrogen receptor positive (HCC)  Abnormal posture  Stiffness of left shoulder, not elsewhere classified  Stiffness of right shoulder, not elsewhere classified  Rationale for Evaluation and Treatment: Rehabilitation  ONSET DATE: 12/26/2022  SUBJECTIVE:  SUBJECTIVE STATEMENT: Patient reports she is here today to be seen by her medical team for her newly diagnosed right breast cancer.   PERTINENT HISTORY:  Patient was diagnosed on 12/26/2022 with right grade 2 invasive ductal carcinoma breast cancer. It measures 6 mm and 7 mm and is located in the lower outer quadrant. It is ER/PR positive and 1 mass is HER2 positive and one is HER2 negative with a Ki67 of 20%.   PATIENT GOALS:   reduce lymphedema risk and learn post op HEP.   PAIN:  Are you having pain? No  PRECAUTIONS: Active CA   HAND DOMINANCE: right  WEIGHT BEARING RESTRICTIONS: No  FALLS:  Has patient fallen in last 6 months? No  LIVING ENVIRONMENT: Patient lives with: her husband Lives in: House/apartment Has following equipment at home: None  OCCUPATION: Retired  LEISURE: She walks 4-5x/week for 15 minutes  PRIOR LEVEL OF FUNCTION:  Independent   OBJECTIVE:  COGNITION: Overall cognitive status: Within functional limits for tasks assessed    POSTURE:  Forward head and rounded shoulders posture  UPPER EXTREMITY AROM/PROM:  A/PROM RIGHT   eval   Shoulder extension 48  Shoulder flexion 126  Shoulder abduction 124  Shoulder internal rotation 72  Shoulder external rotation 78    (Blank rows = not tested)  A/PROM LEFT   eval  Shoulder extension 60 with c/o tightness  Shoulder flexion 118 with c/o tightness  Shoulder abduction 121   Shoulder internal rotation 72  Shoulder external rotation 72    (Blank rows = not tested)  CERVICAL AROM: All within normal limits  UPPER EXTREMITY STRENGTH: WNL  LYMPHEDEMA ASSESSMENTS:   LANDMARK RIGHT   eval  10 cm proximal to olecranon process 30.5  Olecranon process 26.8  10 cm proximal to ulnar styloid process 23.5  Just proximal to ulnar styloid process 16.8  Across hand at thumb web space 18.7  At base of 2nd digit 6.9  (Blank rows = not tested)  LANDMARK LEFT   eval  10 cm proximal to olecranon process 31.2  Olecranon process 26.2  10 cm proximal to ulnar styloid process 23  Just proximal to ulnar styloid process 17.2  Across hand at thumb web space 18.8  At base of 2nd digit 6.8  (Blank rows = not tested)  L-DEX LYMPHEDEMA SCREENING:  The patient was assessed using the L-Dex machine today to produce a lymphedema index baseline score. The patient will be reassessed on a regular basis (typically every 3 months) to obtain new L-Dex scores. If the score is > 6.5 points away from his/her baseline score indicating onset of subclinical lymphedema, it will be recommended to wear a compression garment for 4 weeks, 12 hours per day and then be reassessed. If the score continues to be > 6.5 points from baseline at reassessment, we will initiate lymphedema treatment. Assessing in this manner has a 95% rate of preventing clinically significant lymphedema.   L-DEX  FLOWSHEETS - 01/09/23 1700       L-DEX LYMPHEDEMA SCREENING   Measurement Type Unilateral    L-DEX MEASUREMENT EXTREMITY Upper Extremity    POSITION  Standing    DOMINANT SIDE Right    At Risk Side Right    BASELINE SCORE (UNILATERAL) -2.3             QUICK DASH SURVEY:  Neldon Mc - 01/09/23 0001     Open a tight or new jar Moderate difficulty    Do heavy household chores (wash  walls, wash floors) Moderate difficulty    Carry a shopping bag or briefcase Moderate difficulty    Wash your back No difficulty    Use a knife to cut food No difficulty    Recreational activities in which you take some force or impact through your arm, shoulder, or hand (golf, hammering, tennis) Moderate difficulty    During the past week, to what extent has your arm, shoulder or hand problem interfered with your normal social activities with family, friends, neighbors, or groups? Not at all    During the past week, to what extent has your arm, shoulder or hand problem limited your work or other regular daily activities Slightly    Arm, shoulder, or hand pain. Mild    Tingling (pins and needles) in your arm, shoulder, or hand None    Difficulty Sleeping No difficulty    DASH Score 22.73 %              PATIENT EDUCATION:  Education details: Lymphedema risk reduction and post op shoulder/posture HEP Person educated: Patient Education method: Explanation, Demonstration, Handout Education comprehension: Patient verbalized understanding and returned demonstration  HOME EXERCISE PROGRAM: Patient was instructed today in a home exercise program today for post op shoulder range of motion. These included active assist shoulder flexion in sitting, scapular retraction, wall walking with shoulder abduction, and hands behind head external rotation.  She was encouraged to do these twice a day, holding 3 seconds and repeating 5 times when permitted by her physician.   ASSESSMENT:  CLINICAL  IMPRESSION: Patient was diagnosed on 12/26/2022 with right grade 2 invasive ductal carcinoma breast cancer. It measures 6 mm and 7 mm and is located in the lower outer quadrant. It is ER/PR positive and 1 mass is HER2 positive and one is HER2 negative with a Ki67 of 20%. Her multidisciplinary medical team met prior to her assessments to determine a recommended treatment plan. She is planning to have a right lumpectomy and sentinel node biopsy followed by possible radiation and anti-estrogen therapy. She will benefit from a post op PT reassessment to determine needs and from L-Dex screens every 3 months for 2 years to detect subclinical lymphedema.  Pt will benefit from skilled therapeutic intervention to improve on the following deficits: Decreased knowledge of precautions, impaired UE functional use, pain, decreased ROM, postural dysfunction.   PT treatment/interventions: ADL/self-care home management, pt/family education, therapeutic exercise  REHAB POTENTIAL: Excellent  CLINICAL DECISION MAKING: Stable/uncomplicated  EVALUATION COMPLEXITY: Low   GOALS: Goals reviewed with patient? YES  LONG TERM GOALS: (STG=LTG)    Name Target Date Goal status  1 Pt will be able to verbalize understanding of pertinent lymphedema risk reduction practices relevant to her dx specifically related to skin care.  Baseline:  No knowledge 01/09/2023 Achieved at eval  2 Pt will be able to return demo and/or verbalize understanding of the post op HEP related to regaining shoulder ROM. Baseline:  No knowledge 01/09/2023 Achieved at eval  3 Pt will be able to verbalize understanding of the importance of attending the post op After Breast CA Class for further lymphedema risk reduction education and therapeutic exercise.  Baseline:  No knowledge 01/09/2023 Achieved at eval  4 Pt will demo she has regained full shoulder ROM and function post operatively compared to baselines.  Baseline: See objective measurements taken  today. 03/06/2023     PLAN:  PT FREQUENCY/DURATION: EVAL and 1 follow up appointment.   PLAN FOR NEXT SESSION: will reassess 3-4  weeks post op to determine needs.   Patient will follow up at outpatient cancer rehab 3-4 weeks following surgery.  If the patient requires physical therapy at that time, a specific plan will be dictated and sent to the referring physician for approval. The patient was educated today on appropriate basic range of motion exercises to begin post operatively and the importance of attending the After Breast Cancer class following surgery.  Patient was educated today on lymphedema risk reduction practices as it pertains to recommendations that will benefit the patient immediately following surgery.  She verbalized good understanding.    Physical Therapy Information for After Breast Cancer Surgery/Treatment:  Lymphedema is a swelling condition that you may be at risk for in your arm if you have lymph nodes removed from the armpit area.  After a sentinel node biopsy, the risk is approximately 5-9% and is higher after an axillary node dissection.  There is treatment available for this condition and it is not life-threatening.  Contact your physician or physical therapist with concerns. You may begin the 4 shoulder/posture exercises (see additional sheet) when permitted by your physician (typically a week after surgery).  If you have drains, you may need to wait until those are removed before beginning range of motion exercises.  A general recommendation is to not lift your arms above shoulder height until drains are removed.  These exercises should be done to your tolerance and gently.  This is not a "no pain/no gain" type of recovery so listen to your body and stretch into the range of motion that you can tolerate, stopping if you have pain.  If you are having immediate reconstruction, ask your plastic surgeon about doing exercises as he or she may want you to wait. We encourage  you to attend the free one time ABC (After Breast Cancer) class offered by [facility].  You will learn information related to lymphedema risk, prevention and treatment and additional exercises to regain mobility following surgery.  You can call [phone] for more information.  This is offered the 1st and 3rd Monday of each month.  You only attend the class one time. While undergoing any medical procedure or treatment, try to avoid blood pressure being taken or needle sticks from occurring on the arm on the side of cancer.   This recommendation begins after surgery and continues for the rest of your life.  This may help reduce your risk of getting lymphedema (swelling in your arm). An excellent resource for those seeking information on lymphedema is the National Lymphedema Network's web site. It can be accessed at www.lymphnet.org If you notice swelling in your hand, arm or breast at any time following surgery (even if it is many years from now), please contact your doctor or physical therapist to discuss this.  Lymphedema can be treated at any time but it is easier for you if it is treated early on.  If you feel like your shoulder motion is not returning to normal in a reasonable amount of time, please contact your surgeon or physical therapist.  [facility] [phone]. [address], [place] [address]  ABC CLASS After Breast Cancer Class  After Breast Cancer Class is a specially designed exercise class to assist you in a safe recover after having breast cancer surgery.  In this class you will learn how to get back to full function whether your drains were just removed or if you had surgery a  month ago.  This one-time class is held the 1st and 3rd Monday of every month from 11:00 a.m. until 12:00 noon virtually.  This class is FREE and space is limited. For more information or to register for the next available class, call  [phone].  Class Goals  Understand specific stretches to improve the flexibility of you chest and shoulder. Learn ways to safely strengthen your upper body and improve your posture. Understand the warning signs of infection and why you may be at risk for an arm infection. Learn about Lymphedema and prevention.  ** You do not attend this class until after surgery.  Drains must be removed to participate  Patient was instructed today in a home exercise program today for post op shoulder range of motion. These included active assist shoulder flexion in sitting, scapular retraction, wall walking with shoulder abduction, and hands behind head external rotation.  She was encouraged to do these twice a day, holding 3 seconds and repeating 5 times when permitted by her physician.  Bethann Punches, [place] 01/09/23 5:53 PM

## 2023-01-09 NOTE — Assessment & Plan Note (Addendum)
01/01/2023:Screening mammogram detected right breast mass: 2 adjacent masses at 7 o'clock position measuring 0.9 cm and 0.6 cm.  Biopsy of 0.9 cm mass: Grade 2 IDC with micropapillary features ER 90%, PR 90%, HER2 positive, Ki-67 20%; biopsy of the 0.6 cm mass is grade 2 IDC with micropapillary features ER 100%, PR 80%, HER2 negative, Ki67 20%, axilla negative  Pathology and radiology counseling: Discussed with the patient, the details of pathology including the type of breast cancer,the clinical staging, the significance of ER, PR and HER-2/neu receptors and the implications for treatment. After reviewing the pathology in detail, we proceeded to discuss the different treatment options between surgery, radiation, chemotherapy, antiestrogen therapies.  Treatment plan: Breast conserving surgery with sentinel lymph node biopsy With plan to repeat HER2 testing on the final path.  If it is HER2 positive we will determine adjuvant treatment plan based on the final size. Adjuvant radiation therapy Followed by antiestrogen therapy  Return to clinic after surgery to discuss final pathology report.

## 2023-01-09 NOTE — Progress Notes (Signed)
[facility] CONSULT NOTE  Patient Care Team: Jeani Sow, MD as PCP - General (Family Medicine) Serena Croissant, MD as Consulting Physician (Hematology and Oncology) Harriette Bouillon, MD as Consulting Physician (General Surgery) Dorothy Puffer, MD as Consulting Physician (Radiation Oncology) Pershing Proud, RN as Oncology Nurse Navigator Donnelly Angelica, RN as Oncology Nurse Navigator  CHIEF COMPLAINTS/PURPOSE OF CONSULTATION:  Newly diagnosed breast cancer  HISTORY OF PRESENTING ILLNESS:  Monica Rogers 72 y.o. female is here because of recent diagnosis of left breast cancer.  Patient had routine screening mammogram that detected a right breast masses.  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  She was presented this morning to the multidisciplinary tumor board and the recommendation from the tumor board was to repeat the prognostic panel on the final pathology.  She is here today accompanied by her husband to discuss her treatment plan.  I reviewed her records extensively and collaborated the history with the patient.  SUMMARY OF ONCOLOGIC HISTORY: Oncology History  Malignant neoplasm of lower-outer quadrant of left breast of female, estrogen receptor positive (HCC)  01/01/2023 Initial Diagnosis   Screening mammogram detected right breast mass: 2 adjacent masses at 7 o'clock position measuring 0.9 cm and 0.6 cm.  Biopsy of 0.9 cm mass: Grade 2 IDC with micropapillary features ER 90%, PR 90%, HER2 positive, Ki-67 20%; biopsy of the 0.6 cm mass is grade 2 IDC with micropapillary features ER 100%, PR 80%, HER2 negative, Ki67 20%, axilla negative   01/09/2023 Cancer Staging   Staging form: Breast, AJCC 8th Edition - Clinical stage from 01/09/2023: Stage IA (cT1b, cN0, cM0, G2, ER+, PR+, HER2+) - Signed by Ronny Bacon, PA-C on  01/09/2023 Stage prefix: Initial diagnosis Method of lymph node assessment: Clinical Histologic grading system: 3 grade system      MEDICAL HISTORY:  Past Medical History:  Diagnosis Date   Allergy    Anemia    Arthritis    Blood transfusion without reported diagnosis    Chronic headaches    Diverticulosis    Hyperlipidemia    Hypertension    Hypothyroidism    IBS (irritable bowel syndrome)    Prediabetes    Recurrent UTI     SURGICAL HISTORY: Past Surgical History:  Procedure Laterality Date   BREAST BIOPSY Right 2019   BREAST BIOPSY Right 12/25/2022   [place] RT BREAST BX W LOC DEV 1ST LESION IMG BX SPEC US GUIDE 12/25/2022 [facility]   BREAST BIOPSY Right 01/01/2023   [place] RT BREAST BX W LOC DEV 1ST LESION IMG BX SPEC US GUIDE 01/01/2023 [facility]   IR ANGIOGRAM SELECTIVE EACH ADDITIONAL VESSEL  09/28/2020   IR ANGIOGRAM SELECTIVE EACH ADDITIONAL VESSEL  09/28/2020   IR ANGIOGRAM SELECTIVE EACH ADDITIONAL VESSEL  09/28/2020   IR ANGIOGRAM SELECTIVE EACH ADDITIONAL VESSEL  09/28/2020   IR ANGIOGRAM VISCERAL SELECTIVE  09/28/2020   IR ANGIOGRAM VISCERAL SELECTIVE  09/28/2020   IR EMBO ART  VEN HEMORR LYMPH EXTRAV  INC GUIDE ROADMAPPING  09/28/2020   IR US GUIDE VASC ACCESS RIGHT  09/28/2020   TONSILLECTOMY      SOCIAL HISTORY: Social History   Socioeconomic History   Marital status: Married    Spouse name: Not on file   Number of children: 3   Years of education: Not on file  Highest education level: Not on file  Occupational History   Occupation: Artist  Tobacco Use   Smoking status: Never   Smokeless tobacco: Never  Vaping Use   Vaping Use: Never used  Substance and Sexual Activity   Alcohol use: No   Drug use: No   Sexual activity: Not Currently    Birth control/protection: None  Other Topics Concern   Not on file  Social History Narrative   1 grand      Retired Runner, broadcasting/film/video.   Artist now   Social Determinants of Health   Financial  Resource Strain: Low Risk  (04/26/2022)   Overall Financial Resource Strain (CARDIA)    Difficulty of Paying Living Expenses: Not hard at all  Food Insecurity: No Food Insecurity (04/26/2022)   Hunger Vital Sign    Worried About Running Out of Food in the Last Year: Never true    Ran Out of Food in the Last Year: Never true  Transportation Needs: No Transportation Needs (04/26/2022)   PRAPARE - Administrator, Civil Service (Medical): No    Lack of Transportation (Non-Medical): No  Physical Activity: Sufficiently Active (04/26/2022)   Exercise Vital Sign    Days of Exercise per Week: 5 days    Minutes of Exercise per Session: 30 min  Stress: No Stress Concern Present (04/26/2022)   [employer] of Occupational Health - Occupational Stress Questionnaire    Feeling of Stress : Not at all  Social Connections: Socially Integrated (04/26/2022)   Social Connection and Isolation Panel [NHANES]    Frequency of Communication with Friends and Family: More than three times a week    Frequency of Social Gatherings with Friends and Family: More than three times a week    Attends Religious Services: More than 4 times per year    Active Member of [employer] or Organizations: Yes    Attends Engineer, structural: More than 4 times per year    Marital Status: Married  Catering manager Violence: Not At Risk (04/26/2022)   Humiliation, Afraid, Rape, and Kick questionnaire    Fear of Current or Ex-Partner: No    Emotionally Abused: No    Physically Abused: No    Sexually Abused: No    FAMILY HISTORY: Family History  Problem Relation Age of Onset   Hearing loss Mother    Miscarriages / [place] Mother    Alcohol abuse Father    Hearing loss Father    Lung cancer Father        smoker   Hypertension Father    Diabetes Brother    Cancer Maternal Grandmother    Diabetes Paternal Grandmother     ALLERGIES:  is allergic to dairycare [lactase-lactobacillus], june grass pollen  standardized [timothy grass pollen allergen], and wheat.  MEDICATIONS:  Current Outpatient Medications  Medication Sig Dispense Refill   AMBULATORY NON FORMULARY MEDICATION Bone Up 1 tablet in the morning and 2 in the afternoon     ARMOUR THYROID 120 MG tablet Take 1 tablet (120 mg total) by mouth daily. 90 tablet 3   Blood Glucose Monitoring Suppl (ONETOUCH VERIO) w/Device KIT Use to test blood sugar daily 1 kit 0   Boswellia Serrata (BOSWELLIA PO) Take 2 tablets by mouth 2 (two) times daily.     butalbital-acetaminophen-caffeine (FIORICET) 50-325-40 MG tablet Take 1 tablet by mouth every 6 (six) hours as needed for headache. 14 tablet 0   CALCIUM PO Take 1 tablet by mouth daily.  Carboxymethylcellulose Sodium (EYE DROPS OP) Apply 2 drops to eye 2 (two) times daily as needed (dry eyes).     CHONDROITIN SULFATE PO Take 1 tablet by mouth daily.     clotrimazole-betamethasone (LOTRISONE) cream Apply a small amount to affected area 1-2 times per day for 14 days, then stop and use as needed.     Coenzyme Q10 (CO Q-10 PO) Take 1 tablet by mouth in the morning and at bedtime.     DANDELION ROOT PO Take 1 tablet by mouth 2 (two) times daily.     dexamethasone (DECADRON) 0.1 % ophthalmic solution SMARTSIG:2 Drop(s) In Ear(s)     dicyclomine (BENTYL) 10 MG capsule TAKE 1 CAPSULE (10 MG TOTAL) BY MOUTH EVERY 8 (EIGHT) HOURS AS NEEDED FOR SPASMS. 270 capsule 3   Digestive Enzymes (ENZYME DIGEST) CAPS Take 2 capsules by mouth daily.     estradiol (CLIMARA - DOSED IN MG/24 HR) 0.05 mg/24hr patch APPLY 1 PATCH ONCE EVERY WEEK     estradiol (ESTRACE) 0.1 MG/GM vaginal cream Place 1 Applicatorful vaginally 2 (two) times a week.     fluconazole (DIFLUCAN) 150 MG tablet Take 1 tablet (150 mg total) by mouth every three (3) days as needed. 10 tablet 0   Fluocinolone Acetonide 0.01 % OIL INSTILL 4 DROPS NIGHTLY INTO BOTH EARS FOR 14 DAYS. THEN STOP AND USE AS NEEDED FOR ITCHING.     fluticasone (FLONASE) 50  MCG/ACT nasal spray Place 2 sprays into both nostrils daily.     Glucosamine-Chondroitin 500-400 MG CAPS Take 1 tablet by mouth 3 (three) times daily.     glucose blood (ONETOUCH VERIO) test strip Use as instructed 100 each 2   Lancets (ONETOUCH ULTRASOFT) lancets Use as instructed 100 each 2   Magnesium 110 MG CAPS Take 2 capsules by mouth 2 (two) times daily.     olmesartan (BENICAR) 20 MG tablet Take 1 tablet (20 mg total) by mouth daily. 90 tablet 3   Omega-3 Fatty Acids (FISH OIL PO) Take 1 tablet by mouth 2 (two) times daily.     OVER THE COUNTER MEDICATION Take 1 tablet by mouth daily. Bone up bone supplement     phenazopyridine (PYRIDIUM) 200 MG tablet Take 1 tablet (200 mg total) by mouth 3 (three) times daily as needed for pain. 15 tablet 0   progesterone (PROMETRIUM) 200 MG capsule Take 200 mg by mouth daily.     QUERCETIN PO Take 2 tablets by mouth 3 (three) times daily.     rosuvastatin (CRESTOR) 10 MG tablet Take 10 mg by mouth daily.     Thiamine HCl (VITAMIN B-1 PO) Take 1 tablet by mouth daily.     VITAMIN D, CHOLECALCIFEROL, PO Take 10,000 Units by mouth daily.      Zinc Sulfate 66 MG TABS Take 132 mg by mouth daily.     No current facility-administered medications for this visit.    REVIEW OF SYSTEMS:   Constitutional: Denies fevers, chills or abnormal night sweats Breast:  Denies any palpable lumps or discharge All other systems were reviewed with the patient and are negative.  PHYSICAL EXAMINATION: ECOG PERFORMANCE STATUS: 0 - Asymptomatic  Vitals:   01/09/23 1257  BP: 132/60  Pulse: 90  Resp: 18  Temp: 97.7 F (36.5 C)  SpO2: 100%   Filed Weights   01/09/23 1257  Weight: 209 lb 14.4 oz (95.2 kg)    GENERAL:alert, no distress and comfortable    LABORATORY DATA:  I have  reviewed the data as listed Lab Results  Component Value Date   WBC 8.2 01/09/2023   HGB 14.9 01/09/2023   HCT 45.4 01/09/2023   MCV 96.0 01/09/2023   PLT 283 01/09/2023   Lab  Results  Component Value Date   NA 140 01/09/2023   K 4.2 01/09/2023   CL 104 01/09/2023   CO2 29 01/09/2023    RADIOGRAPHIC STUDIES: I have personally reviewed the radiological reports and agreed with the findings in the report.  ASSESSMENT AND PLAN:  Malignant neoplasm of lower-outer quadrant of left breast of female, estrogen receptor positive (HCC) 01/01/2023:Screening mammogram detected right breast mass: 2 adjacent masses at 7 o'clock position measuring 0.9 cm and 0.6 cm.  Biopsy of 0.9 cm mass: Grade 2 IDC with micropapillary features ER 90%, PR 90%, HER2 positive, Ki-67 20%; biopsy of the 0.6 cm mass is grade 2 IDC with micropapillary features ER 100%, PR 80%, HER2 negative, Ki67 20%, axilla negative  Pathology and radiology counseling: Discussed with the patient, the details of pathology including the type of breast cancer,the clinical staging, the significance of ER, PR and HER-2/neu receptors and the implications for treatment. After reviewing the pathology in detail, we proceeded to discuss the different treatment options between surgery, radiation, chemotherapy, antiestrogen therapies.  Treatment plan: Breast conserving surgery with sentinel lymph node biopsy With plan to repeat HER2 testing on the final path.  If it is HER2 positive we will determine adjuvant treatment plan based on the final size. Adjuvant radiation therapy Followed by antiestrogen therapy  Return to clinic after surgery to discuss final pathology report.  All questions were answered. The patient knows to call the clinic with any problems, questions or concerns.    Tamsen Meek, MD 01/09/23

## 2023-01-09 NOTE — Progress Notes (Signed)
Radiation Oncology         [phone] ________________________________  Name: Monica Rogers        MRN: [id number]  Date of Service: 01/09/2023 DOB: 22-Aug-1950  NW:GNFAO, Maryruth Hancock, MD  Harriette Bouillon, MD     REFERRING PHYSICIAN: Harriette Bouillon, MD   DIAGNOSIS: The encounter diagnosis was Malignant neoplasm of lower-outer quadrant of left breast of female, estrogen receptor positive (HCC).   HISTORY OF PRESENT ILLNESS: Monica Rogers is a 72 y.o. female seen in the multidisciplinary breast clinic for a new diagnosis of right breast cancer. The patient was noted to have screening detected by [place] in the right breast in the lower outer quadrant.  Diagnostic imaging identified 2 areas in the 7:00 axis I measuring 9 mm the other measuring 6 mm.  Her axilla was negative for adenopathy.  She underwent biopsies on 12/25/2022.  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  She is seen today to discuss treatment recommendations of her cancer.     PREVIOUS RADIATION THERAPY: No   PAST MEDICAL HISTORY:  Past Medical History:  Diagnosis Date   Allergy    Anemia    Arthritis    Blood transfusion without reported diagnosis    Chronic headaches    Diverticulosis    Hyperlipidemia    Hypertension    Hypothyroidism    IBS (irritable bowel syndrome)    Prediabetes    Recurrent UTI        PAST SURGICAL HISTORY: Past Surgical History:  Procedure Laterality Date   BREAST BIOPSY Right 2019   BREAST BIOPSY Right 12/25/2022   [place] RT BREAST BX W LOC DEV 1ST LESION IMG BX SPEC US GUIDE 12/25/2022 [facility]   BREAST BIOPSY Right 01/01/2023    [place] RT BREAST BX W LOC DEV 1ST LESION IMG BX SPEC US GUIDE 01/01/2023 [facility]   IR ANGIOGRAM SELECTIVE EACH ADDITIONAL VESSEL  09/28/2020   IR ANGIOGRAM SELECTIVE EACH ADDITIONAL VESSEL  09/28/2020   IR ANGIOGRAM SELECTIVE EACH ADDITIONAL VESSEL  09/28/2020   IR ANGIOGRAM SELECTIVE EACH ADDITIONAL VESSEL  09/28/2020   IR ANGIOGRAM VISCERAL SELECTIVE  09/28/2020   IR ANGIOGRAM VISCERAL SELECTIVE  09/28/2020   IR EMBO ART  VEN HEMORR LYMPH EXTRAV  INC GUIDE ROADMAPPING  09/28/2020   IR US GUIDE VASC ACCESS RIGHT  09/28/2020   TONSILLECTOMY       FAMILY HISTORY:  Family History  Problem Relation Age of Onset   Hearing loss Mother    Miscarriages / [place] Mother    Alcohol abuse Father    Hearing loss Father    Lung cancer Father        smoker   Hypertension Father    Diabetes Brother    Cancer Maternal Grandmother    Diabetes Paternal Grandmother      SOCIAL HISTORY:  reports that she has never smoked. She has never used smokeless tobacco. She reports that she does not drink alcohol and does not use drugs.  The patient is married and lives in [place].  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 She has two adult children and is originally from [place].    ALLERGIES: Dairycare [lactase-lactobacillus], June grass pollen standardized [timothy grass pollen allergen], and Wheat   MEDICATIONS:  Current Outpatient Medications  Medication Sig Dispense Refill   AMBULATORY NON FORMULARY MEDICATION Bone Up 1 tablet in the morning and 2 in the afternoon     ARMOUR THYROID 120 MG tablet Take 1 tablet (120 mg total) by mouth daily. 90 tablet 3   Blood Glucose Monitoring Suppl (ONETOUCH VERIO) w/Device KIT Use to test blood sugar daily 1 kit 0   Boswellia Serrata (BOSWELLIA PO) Take 2 tablets by mouth 2 (two) times daily.     butalbital-acetaminophen-caffeine (FIORICET) 50-325-40 MG tablet Take 1 tablet by  mouth every 6 (six) hours as needed for headache. 14 tablet 0   CALCIUM PO Take 1 tablet by mouth daily.     Carboxymethylcellulose Sodium (EYE DROPS OP) Apply 2 drops to eye 2 (two) times daily as needed (dry eyes).     CHONDROITIN SULFATE PO Take 1 tablet by mouth daily.     clotrimazole-betamethasone (LOTRISONE) cream Apply a small amount to affected area 1-2 times per day for 14 days, then stop and use as needed.     Coenzyme Q10 (CO Q-10 PO) Take 1 tablet by mouth in the morning and at bedtime.     DANDELION ROOT PO Take 1 tablet by mouth 2 (two) times daily.     dexamethasone (DECADRON) 0.1 % ophthalmic solution SMARTSIG:2 Drop(s) In Ear(s)     dicyclomine (BENTYL) 10 MG capsule TAKE 1 CAPSULE (10 MG TOTAL) BY MOUTH EVERY 8 (EIGHT) HOURS AS NEEDED FOR SPASMS. 270 capsule 3   Digestive Enzymes (ENZYME DIGEST) CAPS Take 2 capsules by mouth daily.     estradiol (CLIMARA - DOSED IN MG/24 HR) 0.05 mg/24hr patch APPLY 1 PATCH ONCE EVERY WEEK     estradiol (ESTRACE) 0.1 MG/GM vaginal cream Place 0.5 g vaginally 2 (two) times a week. Place 0.5g nightly for two weeks then twice a week after 30 g 11   estradiol (ESTRACE) 0.1 MG/GM vaginal cream Place 1 Applicatorful vaginally 2 (two) times a week.     fluconazole (DIFLUCAN) 150 MG tablet Take 1 tablet (150 mg total) by mouth every three (3) days as needed. 10 tablet 0   Fluocinolone Acetonide 0.01 % OIL INSTILL 4 DROPS NIGHTLY INTO BOTH EARS FOR 14 DAYS. THEN STOP AND USE AS NEEDED FOR ITCHING.     fluticasone (FLONASE) 50 MCG/ACT nasal spray Place 2 sprays into both nostrils daily.     Glucosamine-Chondroitin 500-400 MG CAPS Take 1 tablet by mouth 3 (three) times daily.     glucose blood (ONETOUCH VERIO) test strip Use as instructed 100 each 2   Lancets (ONETOUCH ULTRASOFT) lancets Use as instructed 100 each 2   Magnesium 110 MG CAPS Take 2 capsules by mouth 2 (two) times daily.     olmesartan (BENICAR) 20 MG tablet Take 1 tablet (20 mg total) by  mouth daily. 90 tablet 3   Omega-3 Fatty Acids (FISH OIL PO) Take 1 tablet by mouth 2 (two) times daily.     OVER THE COUNTER MEDICATION Take 1 tablet by mouth daily. Bone up bone supplement     phenazopyridine (PYRIDIUM) 200 MG tablet Take 1 tablet (200 mg total) by mouth 3 (three) times daily as needed for pain. 15 tablet 0   progesterone (PROMETRIUM) 200 MG capsule Take 200 mg  by mouth daily.     QUERCETIN PO Take 2 tablets by mouth 3 (three) times daily.     rosuvastatin (CRESTOR) 10 MG tablet Take 10 mg by mouth daily.     Thiamine HCl (VITAMIN B-1 PO) Take 1 tablet by mouth daily.     VITAMIN D, CHOLECALCIFEROL, PO Take 10,000 Units by mouth daily.      Zinc Sulfate 66 MG TABS Take 132 mg by mouth daily.     No current facility-administered medications for this visit.     REVIEW OF SYSTEMS: On review of systems, the patient reports that she is doing well overall. She is trying to navigate her new diagnosis and hopeful to have more clarify on the prognostics of her cancer to determine her treatment course.      PHYSICAL EXAM:  Wt Readings from Last 3 Encounters:  08/27/22 213 lb 12.8 oz (97 kg)  03/20/22 206 lb (93.4 kg)  03/02/22 210 lb 6 oz (95.4 kg)   Temp Readings from Last 3 Encounters:  08/27/22 98.2 F (36.8 C) (Temporal)  03/02/22 98.2 F (36.8 C) (Temporal)  02/22/22 97.8 F (36.6 C) (Temporal)   BP Readings from Last 3 Encounters:  08/27/22 113/70  07/03/22 130/70  03/20/22 111/75   Pulse Readings from Last 3 Encounters:  08/27/22 86  07/03/22 82  03/20/22 88    In general this is a well appearing caucasian female in no acute distress. She's alert and oriented x4 and appropriate throughout the examination. Cardiopulmonary assessment is negative for acute distress and she exhibits normal effort. Bilateral breast exam is deferred.    ECOG = 1  0 - Asymptomatic (Fully active, able to carry on all predisease activities without restriction)  1 -  Symptomatic but completely ambulatory (Restricted in physically strenuous activity but ambulatory and able to carry out work of a light or sedentary nature. For example, light housework, office work)  2 - Symptomatic, <50% in bed during the day (Ambulatory and capable of all self care but unable to carry out any work activities. Up and about more than 50% of waking hours)  3 - Symptomatic, >50% in bed, but not bedbound (Capable of only limited self-care, confined to bed or chair 50% or more of waking hours)  4 - Bedbound (Completely disabled. Cannot carry on any self-care. Totally confined to bed or chair)  5 - Death   Santiago Glad MM, Creech RH, Tormey DC, et al. [phone]). "Toxicity and response criteria of the [facility]". Am. Evlyn Clines. Oncol. 5 (6): 649-55    LABORATORY DATA:  Lab Results  Component Value Date   WBC 7.5 08/27/2022   HGB 14.6 08/27/2022   HCT 43.4 08/27/2022   MCV 96.5 08/27/2022   PLT 282.0 08/27/2022   Lab Results  Component Value Date   NA 139 08/27/2022   K 3.9 08/27/2022   CL 101 08/27/2022   CO2 28 08/27/2022   Lab Results  Component Value Date   ALT 57 (H) 11/29/2022   AST 36 11/29/2022   ALKPHOS 64 11/29/2022   BILITOT 0.7 11/29/2022      RADIOGRAPHY: [place] RT BREAST BX W LOC DEV 1ST LESION IMG BX SPEC US GUIDE  Addendum Date: 01/03/2023   ADDENDUM REPORT: 01/03/2023 16:17 ADDENDUM: Pathology revealed GRADE II INVASIVE DUCTAL CARCINOMA, WITH MICROPAPILLARY FEATURES of the RIGHT breast, 7:00 o'clock, (venus clip). This was found to be concordant by Dr. Annia Belt. Pathology results were discussed with the patient by  telephone. The patient reported doing well after the biopsy with tenderness at the site. Post biopsy instructions and care were reviewed and questions were answered. The patient was encouraged to call [facility] for any additional concerns. My direct phone number was provided. The patient has a  recent diagnosis of RIGHT breast cancer and was referred to [facility] on January 09, 2023. Pathology results reported by Rene Kocher, RN on 01/02/2023. Electronically Signed   By: Annia Belt M.D.   On: 01/03/2023 16:17   Result Date: 01/03/2023 CLINICAL DATA:  Status post ultrasound biopsy right breast mass 7 o'clock position. EXAM: ULTRASOUND GUIDED RIGHT BREAST CORE NEEDLE BIOPSY COMPARISON:  Previous exam(s). PROCEDURE: I met with the patient and we discussed the procedure of ultrasound-guided biopsy, including benefits and alternatives. We discussed the high likelihood of a successful procedure. We discussed the risks of the procedure, including infection, bleeding, tissue injury, clip migration, and inadequate sampling. Informed written consent was given. The usual time-out protocol was performed immediately prior to the procedure. Lesion quadrant: Lower outer quadrant Using sterile technique and 1% Lidocaine as local anesthetic, under direct ultrasound visualization, a 14 gauge spring-loaded device was used to perform biopsy of right breast mass 7 o'clock position using a lateral approach. At the conclusion of the procedure venous shaped tissue marker clip was deployed into the biopsy cavity. Follow up 2 view mammogram was performed and dictated separately. IMPRESSION: Ultrasound guided biopsy of right breast mass 7 o'clock position. No apparent complications. Electronically Signed: By: Annia Belt M.D. On: 01/01/2023 14:30  MM CLIP PLACEMENT RIGHT  Result Date: 01/01/2023 CLINICAL DATA:  Status post ultrasound biopsy right breast mass 7 o'clock position EXAM: 3D DIAGNOSTIC RIGHT MAMMOGRAM POST STEREOTACTIC BIOPSY COMPARISON:  Previous exam(s). FINDINGS: 3D Mammographic images were obtained following stereotactic guided biopsy of breast mass 7 o'clock position. The biopsy marking clip is in expected position at the site of biopsy.  Additional previously placed biopsy marking clips in the right breast are stable in position. IMPRESSION: Appropriate positioning of the venous shaped biopsy marking clip at the site of biopsy in the lower outer right breast. Final Assessment: Post Procedure Mammograms for Marker Placement Electronically Signed   By: Annia Belt M.D.   On: 01/01/2023 14:33  [place] RT BREAST BX W LOC DEV 1ST LESION IMG BX SPEC US GUIDE  Addendum Date: 12/27/2022   ADDENDUM REPORT: 12/27/2022 11:33 ADDENDUM: Pathology revealed GRADE II INVASIVE DUCTAL CARCINOMA, WITH MICROPAPILLARY FEATURES of the RIGHT breast, 7 o'clock, 1cmfn, (coil clip). This was found to be concordant by Dr. Bary Richard. Pathology results were discussed with the patient by telephone by Randa Lynn, RN Nurse Navigator. The patient reported doing well after the biopsy with tenderness at the site. Post biopsy instructions and care were reviewed and questions were answered. The patient was encouraged to call [facility] for any additional concerns. The patient was referred to [facility] on January 09, 2023. The patient is scheduled for a RIGHT breast ultrasound guided biopsy on Jan 01, 2023. Further recommendations will be guided by the results of this biopsy. Pathology results reported by Rene Kocher, RN on 12/27/2022. Electronically Signed   By: Bary Richard M.D.   On: 12/27/2022 11:33   Result Date: 12/27/2022 CLINICAL DATA:  Patient presents today for ultrasound-guided biopsy of a RIGHT breast  mass 7 o'clock axis EXAM: ULTRASOUND GUIDED RIGHT BREAST CORE NEEDLE BIOPSY COMPARISON:  Previous exam(s). PROCEDURE: I met with the patient and we discussed the procedure of ultrasound-guided biopsy, including benefits and alternatives. We discussed the high likelihood of a successful procedure. We discussed the risks of the procedure, including infection, bleeding,  tissue injury, clip migration, and inadequate sampling. Informed written consent was given. The usual time-out protocol was performed immediately prior to the procedure. Lesion quadrant: Lower outer quadrant Using sterile technique and 1% Lidocaine as local anesthetic, under direct ultrasound visualization, a 12 gauge spring-loaded device was used to perform biopsy of the RIGHT breast mass at the 7 o'clock axis, 1 cm from the nipple, using a lateral approach. At the conclusion of the procedure , a coil shaped tissue marker clip was deployed into the biopsy cavity. Follow up 2 view mammogram was performed and dictated separately. IMPRESSION: Ultrasound guided biopsy of the RIGHT breast mass at the 7 o'clock axis. No apparent complications. Electronically Signed: By: Bary Richard M.D. On: 12/25/2022 11:14  MM CLIP PLACEMENT RIGHT  Result Date: 12/25/2022 CLINICAL DATA:  Status post ultrasound-guided biopsy for a RIGHT breast mass at the 7 o'clock axis. EXAM: 3D DIAGNOSTIC RIGHT MAMMOGRAM POST ULTRASOUND BIOPSY COMPARISON:  Previous exam(s). FINDINGS: 3D Mammographic images were obtained following ultrasound guided biopsy of breast mass at the 7 o'clock axis. The biopsy marking clip is in expected position at the site of biopsy. IMPRESSION: Appropriate positioning of the coil shaped biopsy marking clip at the site of biopsy in the lower outer quadrant of the RIGHT breast corresponding to the targeted mass at the 7 o'clock axis, 1 cm from the nipple. Final Assessment: Post Procedure Mammograms for Marker Placement Electronically Signed   By: Bary Richard M.D.   On: 12/25/2022 11:26  MM 3D DIAGNOSTIC MAMMOGRAM BILATERAL BREAST  Result Date: 12/14/2022 CLINICAL DATA:  Screening recall for a possible left breast asymmetry and possible right breast masses. This patient has a significant history for core needle biopsies of 2 right breast masses in 2019. Patient was a recall from screening from an exam dated  12/09/2017 for possible right breast masses. Diagnostic imaging on 12/13/2017 revealed a bilobed mass in the right breast at 9 o'clock, felt to be probably benign with short-term follow-up recommended. She underwent follow-up diagnostic imaging on 06/16/2018 at which time sonography demonstrated a 7 mm irregular lesion at 8:30 o'clock and a small lesion at 9 o'clock. Ultrasound-guided core needle biopsy of these lesions was recommended. Patient underwent biopsy of lesions on 06/20/2018 with benign concordant results. On 06/15/2018 she was reassessed for bruising related to the biopsy. At that time she had a large right breast hematoma. This was reassessed with ultrasound on 08/22/2018 with persistence of the large hematoma. See subsequently developed large area of fat necrosis and associated dystrophic calcifications in the right breast. EXAM: DIGITAL DIAGNOSTIC BILATERAL MAMMOGRAM WITH TOMOSYNTHESIS; ULTRASOUND RIGHT BREAST LIMITED; ULTRASOUND LEFT BREAST LIMITED TECHNIQUE: Bilateral digital diagnostic mammography and breast tomosynthesis was performed.; Targeted ultrasound examination of the right breast was performed; Targeted ultrasound examination of the left breast was performed. COMPARISON:  Previous exam(s). ACR Breast Density Category b: There are scattered areas of fibroglandular density. FINDINGS: In the right breast, 2 adjacent small irregular masses persist in the anterior breast on spot compression imaging. These lie anterior to changes subsequent to the large right breast hematoma. In the left breast, a small oval mass persists in the lateral breast. Mass is smoothly circumscribed. Targeted  right breast ultrasound is performed, showing 2 adjacent hypoechoic irregular masses in the right breast at 7 o'clock, 1 cm from the nipple, the larger measuring 9 x 5 x 8 mm, smaller, which lies just anterior, but within 5-6 mm, measuring 6 mm where it is best defined in the anti radial projection. Neither  demonstrates internal blood flow on color Doppler analysis. Sonographic imaging of the right axilla demonstrates normal lymph nodes. No enlarged or abnormal lymph nodes. Targeted left breast ultrasound is performed, showing 3 small cysts at 2 o'clock, 2 cm the nipple, 1 superficial to the other, both measuring 4 mm. No solid masses or suspicious lesions. IMPRESSION: 1. Two adjacent small hypoechoic irregular masses the right breast at 7 o'clock, 1 cm from the nipple, anterior depth. Tissue sampling is recommended the more posterior of these, which is larger and better defined. These masses correspond to the masses seen mammographically. No left axillary lymphadenopathy. 2. Two small benign left breast cysts, 1 of which accounts for the mammographic mass/asymmetry. No evidence of left breast malignancy. RECOMMENDATION: 1. Ultrasound-guided core needle biopsy of the 9 mm mass in the right breast at 7 o'clock, 1 cm from the nipple. This procedure was scheduled prior to the patient being discharged from the [facility] Center. Patient may choose not to undergo this biopsy, since she had the significant complication from the previous biopsies. If she declines biopsy, then six-month follow-up right breast mammography and ultrasound would be recommended. I have discussed the findings and recommendations with the patient. If applicable, a reminder letter will be sent to the patient regarding the next appointment. BI-RADS CATEGORY  4: Suspicious. Electronically Signed   By: Amie Portland M.D.   On: 12/14/2022 11:48  [place] LIMITED ULTRASOUND INCLUDING AXILLA RIGHT BREAST  Result Date: 12/14/2022 CLINICAL DATA:  Screening recall for a possible left breast asymmetry and possible right breast masses. This patient has a significant history for core needle biopsies of 2 right breast masses in 2019. Patient was a recall from screening from an exam dated 12/09/2017 for possible right breast masses. Diagnostic imaging on 12/13/2017  revealed a bilobed mass in the right breast at 9 o'clock, felt to be probably benign with short-term follow-up recommended. She underwent follow-up diagnostic imaging on 06/16/2018 at which time sonography demonstrated a 7 mm irregular lesion at 8:30 o'clock and a small lesion at 9 o'clock. Ultrasound-guided core needle biopsy of these lesions was recommended. Patient underwent biopsy of lesions on 06/20/2018 with benign concordant results. On 06/15/2018 she was reassessed for bruising related to the biopsy. At that time she had a large right breast hematoma. This was reassessed with ultrasound on 08/22/2018 with persistence of the large hematoma. See subsequently developed large area of fat necrosis and associated dystrophic calcifications in the right breast. EXAM: DIGITAL DIAGNOSTIC BILATERAL MAMMOGRAM WITH TOMOSYNTHESIS; ULTRASOUND RIGHT BREAST LIMITED; ULTRASOUND LEFT BREAST LIMITED TECHNIQUE: Bilateral digital diagnostic mammography and breast tomosynthesis was performed.; Targeted ultrasound examination of the right breast was performed; Targeted ultrasound examination of the left breast was performed. COMPARISON:  Previous exam(s). ACR Breast Density Category b: There are scattered areas of fibroglandular density. FINDINGS: In the right breast, 2 adjacent small irregular masses persist in the anterior breast on spot compression imaging. These lie anterior to changes subsequent to the large right breast hematoma. In the left breast, a small oval mass persists in the lateral breast. Mass is smoothly circumscribed. Targeted right breast ultrasound is performed, showing 2 adjacent hypoechoic irregular masses in the right  breast at 7 o'clock, 1 cm from the nipple, the larger measuring 9 x 5 x 8 mm, smaller, which lies just anterior, but within 5-6 mm, measuring 6 mm where it is best defined in the anti radial projection. Neither demonstrates internal blood flow on color Doppler analysis. Sonographic imaging of  the right axilla demonstrates normal lymph nodes. No enlarged or abnormal lymph nodes. Targeted left breast ultrasound is performed, showing 3 small cysts at 2 o'clock, 2 cm the nipple, 1 superficial to the other, both measuring 4 mm. No solid masses or suspicious lesions. IMPRESSION: 1. Two adjacent small hypoechoic irregular masses the right breast at 7 o'clock, 1 cm from the nipple, anterior depth. Tissue sampling is recommended the more posterior of these, which is larger and better defined. These masses correspond to the masses seen mammographically. No left axillary lymphadenopathy. 2. Two small benign left breast cysts, 1 of which accounts for the mammographic mass/asymmetry. No evidence of left breast malignancy. RECOMMENDATION: 1. Ultrasound-guided core needle biopsy of the 9 mm mass in the right breast at 7 o'clock, 1 cm from the nipple. This procedure was scheduled prior to the patient being discharged from the Breast Center. Patient may choose not to undergo this biopsy, since she had the significant complication from the previous biopsies. If she declines biopsy, then six-month follow-up right breast mammography and ultrasound would be recommended. I have discussed the findings and recommendations with the patient. If applicable, a reminder letter will be sent to the patient regarding the next appointment. BI-RADS CATEGORY  4: Suspicious. Electronically Signed   By: Amie Portland M.D.   On: 12/14/2022 11:48  [place] LIMITED ULTRASOUND INCLUDING AXILLA LEFT BREAST   Result Date: 12/14/2022 CLINICAL DATA:  Screening recall for a possible left breast asymmetry and possible right breast masses. This patient has a significant history for core needle biopsies of 2 right breast masses in 2019. Patient was a recall from screening from an exam dated 12/09/2017 for possible right breast masses. Diagnostic imaging on 12/13/2017 revealed a bilobed mass in the right breast at 9 o'clock, felt to be probably benign  with short-term follow-up recommended. She underwent follow-up diagnostic imaging on 06/16/2018 at which time sonography demonstrated a 7 mm irregular lesion at 8:30 o'clock and a small lesion at 9 o'clock. Ultrasound-guided core needle biopsy of these lesions was recommended. Patient underwent biopsy of lesions on 06/20/2018 with benign concordant results. On 06/15/2018 she was reassessed for bruising related to the biopsy. At that time she had a large right breast hematoma. This was reassessed with ultrasound on 08/22/2018 with persistence of the large hematoma. See subsequently developed large area of fat necrosis and associated dystrophic calcifications in the right breast. EXAM: DIGITAL DIAGNOSTIC BILATERAL MAMMOGRAM WITH TOMOSYNTHESIS; ULTRASOUND RIGHT BREAST LIMITED; ULTRASOUND LEFT BREAST LIMITED TECHNIQUE: Bilateral digital diagnostic mammography and breast tomosynthesis was performed.; Targeted ultrasound examination of the right breast was performed; Targeted ultrasound examination of the left breast was performed. COMPARISON:  Previous exam(s). ACR Breast Density Category b: There are scattered areas of fibroglandular density. FINDINGS: In the right breast, 2 adjacent small irregular masses persist in the anterior breast on spot compression imaging. These lie anterior to changes subsequent to the large right breast hematoma. In the left breast, a small oval mass persists in the lateral breast. Mass is smoothly circumscribed. Targeted right breast ultrasound is performed, showing 2 adjacent hypoechoic irregular masses in the right breast at 7 o'clock, 1 cm from the nipple, the larger measuring 9  x 5 x 8 mm, smaller, which lies just anterior, but within 5-6 mm, measuring 6 mm where it is best defined in the anti radial projection. Neither demonstrates internal blood flow on color Doppler analysis. Sonographic imaging of the right axilla demonstrates normal lymph nodes. No enlarged or abnormal lymph nodes.  Targeted left breast ultrasound is performed, showing 3 small cysts at 2 o'clock, 2 cm the nipple, 1 superficial to the other, both measuring 4 mm. No solid masses or suspicious lesions. IMPRESSION: 1. Two adjacent small hypoechoic irregular masses the right breast at 7 o'clock, 1 cm from the nipple, anterior depth. Tissue sampling is recommended the more posterior of these, which is larger and better defined. These masses correspond to the masses seen mammographically. No left axillary lymphadenopathy. 2. Two small benign left breast cysts, 1 of which accounts for the mammographic mass/asymmetry. No evidence of left breast malignancy. RECOMMENDATION: 1. Ultrasound-guided core needle biopsy of the 9 mm mass in the right breast at 7 o'clock, 1 cm from the nipple. This procedure was scheduled prior to the patient being [facility]. Patient may choose not to undergo this biopsy, since she had the significant complication from the previous biopsies. If she declines biopsy, then six-month follow-up right breast mammography and ultrasound would be recommended. I have discussed the findings and recommendations with the patient. If applicable, a reminder letter will be sent to the patient regarding the next appointment. BI-RADS CATEGORY  4: Suspicious. Electronically Signed   By: Amie Portland M.D.   On: 12/14/2022 11:48      IMPRESSION/PLAN: 1. Stage IA, cT1bN0M0, grade 2, triple positive invasive ductal carcinoma of the right breast with probable synchronous Stage IA, cT1bN0M0, grade 2 ER/PR positive invasive ductal carcinoma of the right breast. Dr. Mitzi Hansen discusses the pathology findings and reviews the nature of early stage breast disease. The consensus from the breast conference includes breast conservation with lumpectomy with sentinel node biopsy. Dr. Pamelia Hoit recommends repeating prognostic testing on the tumors. If HER2 is not amplified, the tumor may be tested for Oncotype Dx score to  determine a role for systemic therapy. If chemotherapy is not needed, or following the conclusion of chemotherapy, Dr. Mitzi Hansen would recommend external radiotherapy to the breast to reduce risks of local recurrence followed by antiestrogen therapy. However we can make final decisions based on her final surgical results. We discussed the risks, benefits, short, and long term effects of radiotherapy, as well as the curative intent, and the patient is interested in proceeding. Dr. Mitzi Hansen discusses the delivery and logistics of radiotherapy and anticipates a course of 4 or up to 6 1/2 weeks of radiotherapy to the right breast. We will see her back a few weeks after her surgery or chemotherapy completion to discuss the simulation process. 2. Possible genetic predisposition to malignancy. The patient is a candidate for genetic testing given her personal and family history. She will meet with our geneticist today in clinic.   In a visit lasting 60 minutes, greater than 50% of the time was spent face to face reviewing her case, as well as in preparation of, discussing, and coordinating the patient's care.  The above documentation reflects my direct findings during this shared patient visit. Please see the separate note by Dr. Mitzi Hansen on this date for the remainder of the patient's plan of care.    Osker Mason, [facility]    **Disclaimer: This note was dictated with voice recognition software. Similar sounding words can inadvertently  be transcribed and this note may contain transcription errors which may not have been corrected upon publication of note.**

## 2023-01-10 ENCOUNTER — Other Ambulatory Visit: Payer: Self-pay | Admitting: *Deleted

## 2023-01-10 ENCOUNTER — Encounter: Payer: Self-pay | Admitting: Genetic Counselor

## 2023-01-10 DIAGNOSIS — C50511 Malignant neoplasm of lower-outer quadrant of right female breast: Secondary | ICD-10-CM

## 2023-01-10 NOTE — Progress Notes (Signed)
REFERRING PROVIDER: Serena Croissant, MD  PRIMARY PROVIDER:  Jeani Sow, MD  PRIMARY REASON FOR VISIT:  1. Malignant neoplasm of lower-outer quadrant of right breast of female, estrogen receptor positive (HCC)    HISTORY OF PRESENT ILLNESS:   Monica Rogers, a 72 y.o. female, was seen for a [place] cancer genetics consultation at the request of Dr. Pamelia Hoit due to a personal history of cancer.  Monica Rogers presents to clinic today to discuss the possibility of a hereditary predisposition to cancer, to discuss genetic testing, and to further clarify her future cancer risks, as well as potential cancer risks for family members.   In May 2024, at the age of 67, Monica Rogers was diagnosed with invasive ductal carcinoma. The 0.9 cm mass was ER/PR/HER2 positive and the 0.6 cm mass was ER/PR positive, HER2 negative.  CANCER HISTORY:  Oncology History  Malignant neoplasm of lower-outer quadrant of right breast of female, estrogen receptor positive (HCC)  01/01/2023 Initial Diagnosis   Screening mammogram detected right breast mass: 2 adjacent masses at 7 o'clock position measuring 0.9 cm and 0.6 cm.  Biopsy of 0.9 cm mass: Grade 2 IDC with micropapillary features ER 90%, PR 90%, HER2 positive, Ki-67 20%; biopsy of the 0.6 cm mass is grade 2 IDC with micropapillary features ER 100%, PR 80%, HER2 negative, Ki67 20%, axilla negative   01/09/2023 Cancer Staging   Staging form: Breast, AJCC 8th Edition - Clinical stage from 01/09/2023: Stage IA (cT1b, cN0, cM0, G2, ER+, PR+, HER2+) - Signed by Ronny Bacon, PA-C on 01/09/2023 Stage prefix: Initial diagnosis Method of lymph node assessment: Clinical Histologic grading system: 3 grade system     RISK FACTORS:  Menarche was at age 50.  First live birth at age 30.  OCP use for approximately  2-5  years.  Ovaries intact: yes.  Uterus intact: yes.  Menopausal status: postmenopausal.  HRT use:  10-15  years. Colonoscopy: yes;  colonoscopy 12/2020  identified 2 tubular adenomas . Mammogram within the last year: yes. Any excessive radiation exposure in the past: no  Past Medical History:  Diagnosis Date   Allergy    Anemia    Arthritis    Blood transfusion without reported diagnosis    Chronic headaches    Diverticulosis    Hyperlipidemia    Hypertension    Hypothyroidism    IBS (irritable bowel syndrome)    Prediabetes    Recurrent UTI     Past Surgical History:  Procedure Laterality Date   BREAST BIOPSY Right 2019   BREAST BIOPSY Right 12/25/2022   [place] RT BREAST BX W LOC DEV 1ST LESION IMG BX SPEC US GUIDE 12/25/2022 [facility]   BREAST BIOPSY Right 01/01/2023   [place] RT BREAST BX W LOC DEV 1ST LESION IMG BX SPEC US GUIDE 01/01/2023 [facility]   IR ANGIOGRAM SELECTIVE EACH ADDITIONAL VESSEL  09/28/2020   IR ANGIOGRAM SELECTIVE EACH ADDITIONAL VESSEL  09/28/2020   IR ANGIOGRAM SELECTIVE EACH ADDITIONAL VESSEL  09/28/2020   IR ANGIOGRAM SELECTIVE EACH ADDITIONAL VESSEL  09/28/2020   IR ANGIOGRAM VISCERAL SELECTIVE  09/28/2020   IR ANGIOGRAM VISCERAL SELECTIVE  09/28/2020   IR EMBO ART  VEN HEMORR LYMPH EXTRAV  INC GUIDE ROADMAPPING  09/28/2020   IR US GUIDE VASC ACCESS RIGHT  09/28/2020   TONSILLECTOMY      Social History   Socioeconomic History   Marital status: Married    Spouse name: Not on file   Number of  children: 3   Years of education: Not on file   Highest education level: Not on file  Occupational History   Occupation: Artist  Tobacco Use   Smoking status: Never   Smokeless tobacco: Never  Vaping Use   Vaping Use: Never used  Substance and Sexual Activity   Alcohol use: No   Drug use: No   Sexual activity: Not Currently    Birth control/protection: None  Other Topics Concern   Not on file  Social History Narrative   1 grand      Retired Runner, broadcasting/film/video.   Artist now   Social Determinants of Health   Financial Resource Strain: Low Risk  (04/26/2022)   Overall Financial Resource Strain  (CARDIA)    Difficulty of Paying Living Expenses: Not hard at all  Food Insecurity: No Food Insecurity (04/26/2022)   Hunger Vital Sign    Worried About Running Out of Food in the Last Year: Never true    Ran Out of Food in the Last Year: Never true  Transportation Needs: No Transportation Needs (04/26/2022)   PRAPARE - Administrator, Civil Service (Medical): No    Lack of Transportation (Non-Medical): No  Physical Activity: Sufficiently Active (04/26/2022)   Exercise Vital Sign    Days of Exercise per Week: 5 days    Minutes of Exercise per Session: 30 min  Stress: No Stress Concern Present (04/26/2022)   [employer] of Occupational Health - Occupational Stress Questionnaire    Feeling of Stress : Not at all  Social Connections: Socially Integrated (04/26/2022)   Social Connection and Isolation Panel [NHANES]    Frequency of Communication with Friends and Family: More than three times a week    Frequency of Social Gatherings with Friends and Family: More than three times a week    Attends Religious Services: More than 4 times per year    Active Member of [employer] or Organizations: Yes    Attends Engineer, structural: More than 4 times per year    Marital Status: Married     FAMILY HISTORY:  We obtained a detailed, 4-generation family history.  Significant diagnoses are listed below: Family History  Problem Relation Age of Onset   Hearing loss Mother    Miscarriages / [place] Mother    Alcohol abuse Father    Hearing loss Father    Hypertension Father    Diabetes Brother    Cancer Maternal Grandmother        possibly had breast cancer dx. in her 30s   Diabetes Paternal Grandmother      Monica Rogers reports her maternal grandmother possibly had breast cancer in her 5s, she is deceased. Monica Rogers is unaware of previous family history of genetic testing for hereditary cancer risks. There is no reported Ashkenazi Jewish ancestry.   GENETIC COUNSELING  ASSESSMENT: Monica Rogers is a 72 y.o. female with a personal history of cancer which is somewhat suggestive of a hereditary predisposition to cancer. We, therefore, discussed and recommended the following at today's visit.   DISCUSSION: We discussed that 5 - 10% of cancer is hereditary, with most cases of breast cancer associated with BRCA1/2.  There are other genes that can be associated with hereditary breast cancer syndromes.  We discussed that testing is beneficial for several reasons including knowing how to follow individuals after completing their treatment, identifying whether potential treatment options would be beneficial, and understanding if other family members could be at risk for cancer and  allowing them to undergo genetic testing.   We reviewed the characteristics, features and inheritance patterns of hereditary cancer syndromes. We also discussed genetic testing, including the appropriate family members to test, the process of testing, insurance coverage and turn-around-time for results. We discussed the implications of a negative, positive, carrier and/or variant of uncertain significant result. We recommended Monica Rogers pursue genetic testing for a panel that includes genes associated with breast cancer.   Monica Rogers elected to have [facility] Custom Panel. The Custom Hereditary Cancers Panel offered by [facility] includes sequencing and/or deletion duplication testing of the following 43 genes: APC, ATM, AXIN2, BAP1, BARD1, BMPR1A, BRCA1, BRCA2, BRIP1, CDH1, CDK4, CDKN2A (p14ARF and p16INK4a only), CHEK2, CTNNA1, EPCAM (Deletion/duplication testing only), FH, GREM1 (promoter region duplication testing only), HOXB13, KIT, MBD4, MEN1, MLH1, MSH2, MSH3, MSH6, MUTYH, NF1, NHTL1, PALB2, PDGFRA, PMS2, POLD1, POLE, PTEN, RAD51C, RAD51D, SMAD4, SMARCA4. STK11, TP53, TSC1, TSC2, and VHL.  Based on Monica Rogers's personal history of cancer, she meets medical criteria for genetic testing. Despite that she meets  criteria, she may still have an out of pocket cost. We discussed that if her out of pocket cost for testing is over $100, the laboratory will call and confirm whether she wants to proceed with testing.  If the out of pocket cost of testing is less than $100 she will be billed by the genetic testing laboratory.   PLAN: After considering the risks, benefits, and limitations, Monica Rogers provided informed consent to pursue genetic testing and the blood sample was sent to [facility] for analysis of the Custom Panel. Results should be available within approximately 2-3 weeks' time, at which point they will be disclosed by telephone to Monica Rogers, as will any additional recommendations warranted by these results. Monica Rogers will receive a summary of her genetic counseling visit and a copy of her results once available. This information will also be available in Epic.   Monica Rogers questions were answered to her satisfaction today. Our contact information was provided should additional questions or concerns arise. Thank you for the referral and allowing [place] to share in the care of your patient.   Lalla Brothers, MS, [facility] Genetic Counselor [email or web address] (P) [phone]  The patient was seen for a total of 20 minutes in face-to-face genetic counseling.  The patient brought her husband. Drs. Pamelia Hoit and/or Mosetta Putt were available to discuss this case as needed.   _______________________________________________________________________ For Office Staff:  Number of people involved in session: 2 Was an Intern/ student involved with case: no

## 2023-01-11 ENCOUNTER — Encounter: Payer: Self-pay | Admitting: *Deleted

## 2023-01-15 ENCOUNTER — Encounter: Payer: Self-pay | Admitting: General Practice

## 2023-01-15 NOTE — Progress Notes (Signed)
[facility] Spiritual Care Note   Visited with [name] in [facility]. She completed SDOH screening, the results of which were negative.  Chaplain and patient discussed common feelings and emotions when being diagnosed with cancer, and the importance of support during treatment.  Chaplain informed patient of the support team and support services at [facility].  Chaplain provided contact information and encouraged patient to call with any questions or concerns.  Follow up needed: No.   Chaplain Rush Barer, MDiv, [facility] Pager [phone] Voicemail [phone]

## 2023-01-16 ENCOUNTER — Ambulatory Visit: Payer: Self-pay | Admitting: Surgery

## 2023-01-16 DIAGNOSIS — D0511 Intraductal carcinoma in situ of right breast: Secondary | ICD-10-CM

## 2023-01-17 ENCOUNTER — Telehealth: Payer: Self-pay | Admitting: *Deleted

## 2023-01-17 ENCOUNTER — Encounter: Payer: Self-pay | Admitting: *Deleted

## 2023-01-17 NOTE — Telephone Encounter (Signed)
Spoke to pt concerning BMDC from 6.5.24. Denies questions or concerns regarding dx or treatment care plan.  Pt did question need for SLN bx. Discussed reasoning discussed during mdc conference. 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 Informed pt will share concerns with physician team to further discuss with pt on reasoning and possibility of omitting bx. Encourage pt to call with further needs or questions. Received verbal understanding.

## 2023-01-18 ENCOUNTER — Telehealth: Payer: Self-pay | Admitting: Genetic Counselor

## 2023-01-18 ENCOUNTER — Telehealth: Payer: Self-pay | Admitting: Hematology and Oncology

## 2023-01-18 ENCOUNTER — Encounter: Payer: Self-pay | Admitting: Genetic Counselor

## 2023-01-18 DIAGNOSIS — Z1379 Encounter for other screening for genetic and chromosomal anomalies: Secondary | ICD-10-CM | POA: Insufficient documentation

## 2023-01-18 NOTE — Telephone Encounter (Signed)
I contacted Ms. Worsley to discuss her genetic testing results. No pathogenic variants were identified in the 43 genes analyzed. Detailed clinic note to follow.  The test report has been scanned into EPIC and is located under the Molecular Pathology section of the Results Review tab.  A portion of the result report is included below for reference.   Lalla Brothers, MS, [facility] Genetic Counselor [email or web address] (P) [phone]

## 2023-01-18 NOTE — Telephone Encounter (Signed)
Called patient twice and left a message regarding patients upcoming appointment time/dates

## 2023-01-21 ENCOUNTER — Ambulatory Visit: Payer: No Typology Code available for payment source

## 2023-01-23 ENCOUNTER — Telehealth: Payer: Self-pay | Admitting: *Deleted

## 2023-01-23 NOTE — Telephone Encounter (Signed)
Per physician team, may omit SLN bx. Informed pt of decision.

## 2023-02-04 ENCOUNTER — Encounter: Payer: Self-pay | Admitting: Genetic Counselor

## 2023-02-04 ENCOUNTER — Ambulatory Visit: Payer: Self-pay | Admitting: Genetic Counselor

## 2023-02-04 DIAGNOSIS — Z1379 Encounter for other screening for genetic and chromosomal anomalies: Secondary | ICD-10-CM

## 2023-02-04 NOTE — Progress Notes (Signed)
HPI:   Monica Rogers was previously seen in the [place] [facility] due to a personal history of cancer and concerns regarding a hereditary predisposition to cancer. Please refer to our prior cancer genetics clinic note for more information regarding our discussion, assessment and recommendations, at the time. Monica Rogers recent genetic test results were disclosed to her, as were recommendations warranted by these results. These results and recommendations are discussed in more detail below.  CANCER HISTORY:  Oncology History  Malignant neoplasm of lower-outer quadrant of right breast of female, estrogen receptor positive (HCC)  01/01/2023 Initial Diagnosis   Screening mammogram detected right breast mass: 2 adjacent masses at 7 o'clock position measuring 0.9 cm and 0.6 cm.  Biopsy of 0.9 cm mass: Grade 2 IDC with micropapillary features ER 90%, PR 90%, HER2 positive, Ki-67 20%; biopsy of the 0.6 cm mass is grade 2 IDC with micropapillary features ER 100%, PR 80%, HER2 negative, Ki67 20%, axilla negative   01/09/2023 Cancer Staging   Staging form: Breast, AJCC 8th Edition - Clinical stage from 01/09/2023: Stage IA (cT1b, cN0, cM0, G2, ER+, PR+, HER2+) - Signed by Ronny Bacon, PA-C on 01/09/2023 Stage prefix: Initial diagnosis Method of lymph node assessment: Clinical Histologic grading system: 3 grade system    Genetic Testing   [facility] Custom Panel+RNA was Negative. Report date is 01/17/2023.  The Custom Hereditary Cancers Panel offered by [facility] includes sequencing and/or deletion duplication testing of the following 43 genes: APC, ATM, AXIN2, BAP1, BARD1, BMPR1A, BRCA1, BRCA2, BRIP1, CDH1, CDK4, CDKN2A (p14ARF and p16INK4a only), CHEK2, CTNNA1, EPCAM (Deletion/duplication testing only), FH, GREM1 (promoter region duplication testing only), HOXB13, KIT, MBD4, MEN1, MLH1, MSH2, MSH3, MSH6, MUTYH, NF1, NHTL1, PALB2, PDGFRA, PMS2, POLD1, POLE, PTEN, RAD51C, RAD51D, SMAD4, SMARCA4.  STK11, TP53, TSC1, TSC2, and VHL.     FAMILY HISTORY:  We obtained a detailed, 4-generation family history.  Significant diagnoses are listed below:      Family History  Problem Relation Age of Onset   Hearing loss Mother     Miscarriages / [place] Mother     Alcohol abuse Father     Hearing loss Father     Hypertension Father     Diabetes Brother     Cancer Maternal Grandmother          possibly had breast cancer dx. in her 30s   Diabetes Paternal Grandmother         Monica Rogers reports her maternal grandmother possibly had breast cancer in her 9s, she is deceased. Monica Rogers is unaware of previous family history of genetic testing for hereditary cancer risks. There is no reported Ashkenazi Jewish ancestry.    GENETIC TEST RESULTS:  The [facility] Custom Panel found no pathogenic mutations.   The Custom Hereditary Cancers Panel offered by [facility] includes sequencing and/or deletion duplication testing of the following 43 genes: APC, ATM, AXIN2, BAP1, BARD1, BMPR1A, BRCA1, BRCA2, BRIP1, CDH1, CDK4, CDKN2A (p14ARF and p16INK4a only), CHEK2, CTNNA1, EPCAM (Deletion/duplication testing only), FH, GREM1 (promoter region duplication testing only), HOXB13, KIT, MBD4, MEN1, MLH1, MSH2, MSH3, MSH6, MUTYH, NF1, NHTL1, PALB2, PDGFRA, PMS2, POLD1, POLE, PTEN, RAD51C, RAD51D, SMAD4, SMARCA4. STK11, TP53, TSC1, TSC2, and VHL.  The test report has been scanned into EPIC and is located under the Molecular Pathology section of the Results Review tab.  A portion of the result report is included below for reference. Genetic testing reported out on 01/17/2023.       Even though a  pathogenic variant was not identified, possible explanations for the cancer in the family may include: There may be no hereditary risk for cancer in the family. Her history of cancer may be due to other genetic or environmental factors. There may be a gene mutation in one of these genes that current testing methods cannot  detect, but that chance is small. There could be another gene that has not yet been discovered, or that we have not yet tested, that is responsible for the cancer diagnoses in the family.   Therefore, it is important to remain in touch with cancer genetics in the future so that we can continue to offer Monica Rogers the most up to date genetic testing.   ADDITIONAL GENETIC TESTING:  We discussed with Monica Rogers that her genetic testing was fairly extensive.  If there are genes identified to increase cancer risk that can be analyzed in the future, we would be happy to discuss and coordinate this testing at that time.    CANCER SCREENING RECOMMENDATIONS:  Monica Rogers test result is considered negative (normal).  This means that we have not identified a hereditary cause for her personal history of cancer at this time.   An individual's cancer risk and medical management are not determined by genetic test results alone. Overall cancer risk assessment incorporates additional factors, including personal medical history, family history, and any available genetic information that may result in a personalized plan for cancer prevention and surveillance. Therefore, it is recommended she continue to follow the cancer management and screening guidelines provided by her oncology and primary healthcare provider.  RECOMMENDATIONS FOR FAMILY MEMBERS:   Since she did not inherit a mutation in a cancer predisposition gene included on this panel, her children could not have inherited a mutation from her in one of these genes. Individuals in this family might be at some increased risk of developing cancer, over the general population risk, due to the family history of cancer. We recommend women in this family have a yearly mammogram beginning at age 37, or 25 years younger than the earliest onset of cancer, an annual clinical breast exam, and perform monthly breast self-exams.  FOLLOW-UP:  Cancer genetics is a rapidly  advancing field and it is possible that new genetic tests will be appropriate for her and/or her family members in the future. We encouraged her to remain in contact with cancer genetics on an annual basis so we can update her personal and family histories and let her know of advances in cancer genetics that may benefit this family.   Our contact number was provided. Monica Rogers questions were answered to her satisfaction, and she knows she is welcome to call us at anytime with additional questions or concerns.   Lalla Brothers, MS, [facility] Genetic Counselor [email or web address] (P) [phone]

## 2023-02-14 ENCOUNTER — Other Ambulatory Visit: Payer: Self-pay

## 2023-02-14 ENCOUNTER — Encounter (HOSPITAL_BASED_OUTPATIENT_CLINIC_OR_DEPARTMENT_OTHER): Payer: Self-pay | Admitting: Surgery

## 2023-02-15 ENCOUNTER — Other Ambulatory Visit: Payer: Self-pay | Admitting: Family Medicine

## 2023-02-20 DIAGNOSIS — N649 Disorder of breast, unspecified: Secondary | ICD-10-CM | POA: Diagnosis not present

## 2023-02-20 DIAGNOSIS — C50911 Malignant neoplasm of unspecified site of right female breast: Secondary | ICD-10-CM | POA: Diagnosis not present

## 2023-02-25 ENCOUNTER — Ambulatory Visit: Payer: No Typology Code available for payment source | Admitting: Family Medicine

## 2023-02-25 ENCOUNTER — Ambulatory Visit
Admission: RE | Admit: 2023-02-25 | Discharge: 2023-02-25 | Disposition: A | Discharge status: Home / Self Care | Payer: No Typology Code available for payment source | Source: Ambulatory Visit | Attending: Surgery | Admitting: Surgery

## 2023-02-25 ENCOUNTER — Encounter (HOSPITAL_BASED_OUTPATIENT_CLINIC_OR_DEPARTMENT_OTHER)
Admission: RE | Admit: 2023-02-25 | Discharge: 2023-02-25 | Disposition: A | Discharge status: Home / Self Care | Payer: No Typology Code available for payment source | Source: Ambulatory Visit | Attending: Surgery | Admitting: Surgery

## 2023-02-25 DIAGNOSIS — C50911 Malignant neoplasm of unspecified site of right female breast: Secondary | ICD-10-CM | POA: Diagnosis not present

## 2023-02-25 DIAGNOSIS — I10 Essential (primary) hypertension: Secondary | ICD-10-CM | POA: Diagnosis not present

## 2023-02-25 DIAGNOSIS — Z170 Estrogen receptor positive status [ER+]: Secondary | ICD-10-CM | POA: Diagnosis not present

## 2023-02-25 DIAGNOSIS — E785 Hyperlipidemia, unspecified: Secondary | ICD-10-CM | POA: Diagnosis not present

## 2023-02-25 HISTORY — PX: BREAST BIOPSY: SHX20

## 2023-02-25 MED ORDER — CHLORHEXIDINE GLUCONATE CLOTH 2 % EX PADS: 6.0000 | MEDICATED_PAD | Freq: Once | CUTANEOUS | Status: DC

## 2023-02-25 NOTE — Progress Notes (Signed)

## 2023-02-26 ENCOUNTER — Ambulatory Visit (HOSPITAL_BASED_OUTPATIENT_CLINIC_OR_DEPARTMENT_OTHER): Payer: No Typology Code available for payment source | Admitting: Anesthesiology

## 2023-02-26 ENCOUNTER — Other Ambulatory Visit: Payer: Self-pay | Admitting: Surgery

## 2023-02-26 ENCOUNTER — Encounter (HOSPITAL_BASED_OUTPATIENT_CLINIC_OR_DEPARTMENT_OTHER)
Admission: RE | Disposition: A | Discharge status: Home / Self Care | Payer: Self-pay | Source: Home / Self Care | Attending: Surgery

## 2023-02-26 ENCOUNTER — Other Ambulatory Visit: Payer: Self-pay

## 2023-02-26 ENCOUNTER — Encounter (HOSPITAL_BASED_OUTPATIENT_CLINIC_OR_DEPARTMENT_OTHER): Payer: Self-pay | Admitting: Surgery

## 2023-02-26 ENCOUNTER — Encounter: Payer: Self-pay | Admitting: Surgery

## 2023-02-26 ENCOUNTER — Ambulatory Visit (HOSPITAL_BASED_OUTPATIENT_CLINIC_OR_DEPARTMENT_OTHER)
Admission: RE | Admit: 2023-02-26 | Discharge: 2023-02-26 | Disposition: A | Discharge status: Home / Self Care | Payer: No Typology Code available for payment source | Attending: Surgery | Admitting: Surgery

## 2023-02-26 ENCOUNTER — Ambulatory Visit
Admission: RE | Admit: 2023-02-26 | Discharge: 2023-02-26 | Disposition: A | Discharge status: Home / Self Care | Payer: No Typology Code available for payment source | Source: Ambulatory Visit | Attending: Surgery | Admitting: Surgery

## 2023-02-26 DIAGNOSIS — I10 Essential (primary) hypertension: Secondary | ICD-10-CM | POA: Insufficient documentation

## 2023-02-26 DIAGNOSIS — Z853 Personal history of malignant neoplasm of breast: Secondary | ICD-10-CM

## 2023-02-26 DIAGNOSIS — Z170 Estrogen receptor positive status [ER+]: Secondary | ICD-10-CM | POA: Diagnosis not present

## 2023-02-26 DIAGNOSIS — E785 Hyperlipidemia, unspecified: Secondary | ICD-10-CM | POA: Insufficient documentation

## 2023-02-26 DIAGNOSIS — C50811 Malignant neoplasm of overlapping sites of right female breast: Secondary | ICD-10-CM

## 2023-02-26 DIAGNOSIS — C50911 Malignant neoplasm of unspecified site of right female breast: Secondary | ICD-10-CM | POA: Diagnosis not present

## 2023-02-26 DIAGNOSIS — C50511 Malignant neoplasm of lower-outer quadrant of right female breast: Secondary | ICD-10-CM | POA: Diagnosis not present

## 2023-02-26 HISTORY — PX: BREAST LUMPECTOMY WITH RADIOACTIVE SEED LOCALIZATION: SHX6424

## 2023-02-26 SURGERY — BREAST LUMPECTOMY WITH RADIOACTIVE SEED LOCALIZATION
Anesthesia: General | Site: Breast | Laterality: Right

## 2023-02-26 MED ORDER — ACETAMINOPHEN 500 MG PO TABS
ORAL_TABLET | ORAL | Status: AC
  Filled 2023-02-26: qty 2

## 2023-02-26 MED ORDER — CEFAZOLIN SODIUM-DEXTROSE 2-4 GM/100ML-% IV SOLN: 2.0000 g | INTRAVENOUS | Status: DC

## 2023-02-26 MED ORDER — BUPIVACAINE-EPINEPHRINE (PF) 0.25% -1:200000 IJ SOLN
INTRAMUSCULAR | Status: DC | PRN
  Administered 2023-02-26: 20 mL

## 2023-02-26 MED ORDER — CEFAZOLIN SODIUM-DEXTROSE 2-3 GM-%(50ML) IV SOLR
INTRAVENOUS | Status: DC | PRN
  Administered 2023-02-26: 2 g via INTRAVENOUS

## 2023-02-26 MED ORDER — OXYCODONE HCL 5 MG/5ML PO SOLN: 5.0000 mg | Freq: Once | ORAL | Status: DC | PRN

## 2023-02-26 MED ORDER — LIDOCAINE HCL (CARDIAC) PF 100 MG/5ML IV SOSY
PREFILLED_SYRINGE | INTRAVENOUS | Status: DC | PRN
  Administered 2023-02-26: 100 mg via INTRAVENOUS

## 2023-02-26 MED ORDER — PHENYLEPHRINE HCL (PRESSORS) 10 MG/ML IV SOLN
INTRAVENOUS | Status: DC | PRN
  Administered 2023-02-26: 80 ug via INTRAVENOUS
  Administered 2023-02-26: 160 ug via INTRAVENOUS
  Administered 2023-02-26: 80 ug via INTRAVENOUS
  Administered 2023-02-26 (×3): 160 ug via INTRAVENOUS

## 2023-02-26 MED ORDER — LACTATED RINGERS IV SOLN: INTRAVENOUS | Status: DC | PRN

## 2023-02-26 MED ORDER — ONDANSETRON HCL 4 MG/2ML IJ SOLN
INTRAMUSCULAR | Status: AC
  Filled 2023-02-26: qty 2

## 2023-02-26 MED ORDER — OXYCODONE HCL 5 MG PO TABS: 5.0000 mg | ORAL_TABLET | Freq: Four times a day (QID) | ORAL | 0 refills | Status: DC | PRN

## 2023-02-26 MED ORDER — OXYCODONE HCL 5 MG PO TABS: 5.0000 mg | ORAL_TABLET | Freq: Once | ORAL | Status: DC | PRN

## 2023-02-26 MED ORDER — LACTATED RINGERS IV SOLN: INTRAVENOUS | Status: DC

## 2023-02-26 MED ORDER — DEXAMETHASONE SODIUM PHOSPHATE 10 MG/ML IJ SOLN
INTRAMUSCULAR | Status: AC
  Filled 2023-02-26: qty 1

## 2023-02-26 MED ORDER — ONDANSETRON HCL 4 MG/2ML IJ SOLN
INTRAMUSCULAR | Status: DC | PRN
  Administered 2023-02-26: 4 mg via INTRAVENOUS

## 2023-02-26 MED ORDER — PROPOFOL 10 MG/ML IV BOLUS
INTRAVENOUS | Status: DC | PRN
Start: 2023-02-26 — End: 2023-02-26
  Administered 2023-02-26: 200 mg via INTRAVENOUS

## 2023-02-26 MED ORDER — SODIUM CHLORIDE 0.9 % IV SOLN
INTRAVENOUS | Status: DC | PRN
  Administered 2023-02-26: 500 mL

## 2023-02-26 MED ORDER — DEXAMETHASONE SODIUM PHOSPHATE 10 MG/ML IJ SOLN
INTRAMUSCULAR | Status: DC | PRN
  Administered 2023-02-26: 5 mg via INTRAVENOUS

## 2023-02-26 MED ORDER — PROPOFOL 10 MG/ML IV BOLUS
INTRAVENOUS | Status: AC
  Filled 2023-02-26: qty 20

## 2023-02-26 MED ORDER — MIDAZOLAM HCL 2 MG/2ML IJ SOLN
INTRAMUSCULAR | Status: DC | PRN
  Administered 2023-02-26: 1 mg via INTRAVENOUS

## 2023-02-26 MED ORDER — CEFAZOLIN SODIUM-DEXTROSE 2-4 GM/100ML-% IV SOLN
INTRAVENOUS | Status: AC
  Filled 2023-02-26: qty 100

## 2023-02-26 MED ORDER — ACETAMINOPHEN 500 MG PO TABS
1000.0000 mg | ORAL_TABLET | ORAL | Status: AC
  Administered 2023-02-26: 1000 mg via ORAL

## 2023-02-26 MED ORDER — AMISULPRIDE (ANTIEMETIC) 5 MG/2ML IV SOLN: 10.0000 mg | Freq: Once | INTRAVENOUS | Status: DC | PRN

## 2023-02-26 MED ORDER — FENTANYL CITRATE (PF) 100 MCG/2ML IJ SOLN
INTRAMUSCULAR | Status: DC | PRN
  Administered 2023-02-26: 100 ug via INTRAVENOUS

## 2023-02-26 MED ORDER — FENTANYL CITRATE (PF) 100 MCG/2ML IJ SOLN: 25.0000 ug | INTRAMUSCULAR | Status: DC | PRN

## 2023-02-26 MED ORDER — MIDAZOLAM HCL 2 MG/2ML IJ SOLN
INTRAMUSCULAR | Status: AC
  Filled 2023-02-26: qty 2

## 2023-02-26 MED ORDER — FENTANYL CITRATE (PF) 100 MCG/2ML IJ SOLN
INTRAMUSCULAR | Status: AC
  Filled 2023-02-26: qty 2

## 2023-02-26 SURGICAL SUPPLY — 50 items
ADH SKN CLS APL DERMABOND .7 (GAUZE/BANDAGES/DRESSINGS) ×1
APL PRP STRL LF DISP 70% ISPRP (MISCELLANEOUS) ×1
APPLIER CLIP 9.375 MED OPEN (MISCELLANEOUS) ×1
APR CLP MED 9.3 20 MLT OPN (MISCELLANEOUS) ×1
BAG DECANTER FOR FLEXI CONT (MISCELLANEOUS) IMPLANT
BINDER BREAST LRG (GAUZE/BANDAGES/DRESSINGS) IMPLANT
BINDER BREAST MEDIUM (GAUZE/BANDAGES/DRESSINGS) IMPLANT
BINDER BREAST XLRG (GAUZE/BANDAGES/DRESSINGS) IMPLANT
BINDER BREAST XXLRG (GAUZE/BANDAGES/DRESSINGS) IMPLANT
BLADE SURG 15 STRL LF DISP TIS (BLADE) ×2 IMPLANT
BLADE SURG 15 STRL SS (BLADE) ×1
CANISTER SUC SOCK COL 7IN (MISCELLANEOUS) IMPLANT
CANISTER SUCT 1200ML W/VALVE (MISCELLANEOUS) IMPLANT
CHLORAPREP W/TINT 26 (MISCELLANEOUS) ×2 IMPLANT
CLIP APPLIE 9.375 MED OPEN (MISCELLANEOUS) IMPLANT
COVER BACK TABLE 60X90IN (DRAPES) ×2 IMPLANT
COVER MAYO STAND STRL (DRAPES) ×2 IMPLANT
COVER PROBE CYLINDRICAL 5X96 (MISCELLANEOUS) ×2 IMPLANT
DERMABOND ADVANCED .7 DNX12 (GAUZE/BANDAGES/DRESSINGS) ×2 IMPLANT
DRAPE LAPAROSCOPIC ABDOMINAL (DRAPES) IMPLANT
DRAPE LAPAROTOMY 100X72 PEDS (DRAPES) ×2 IMPLANT
DRAPE UTILITY XL STRL (DRAPES) ×2 IMPLANT
ELECT COATED BLADE 2.86 ST (ELECTRODE) ×2 IMPLANT
ELECT REM PT RETURN 9FT ADLT (ELECTROSURGICAL) ×1
ELECTRODE REM PT RTRN 9FT ADLT (ELECTROSURGICAL) ×2 IMPLANT
GLOVE BIOGEL PI IND STRL 8 (GLOVE) ×2 IMPLANT
GLOVE ECLIPSE 8.0 STRL XLNG CF (GLOVE) ×2 IMPLANT
GOWN STRL REUS W/ TWL LRG LVL3 (GOWN DISPOSABLE) ×4 IMPLANT
GOWN STRL REUS W/ TWL XL LVL3 (GOWN DISPOSABLE) ×2 IMPLANT
GOWN STRL REUS W/TWL LRG LVL3 (GOWN DISPOSABLE) ×2
GOWN STRL REUS W/TWL XL LVL3 (GOWN DISPOSABLE) ×1
HEMOSTAT ARISTA ABSORB 3G PWDR (HEMOSTASIS) IMPLANT
HEMOSTAT SNOW SURGICEL 2X4 (HEMOSTASIS) IMPLANT
KIT MARKER MARGIN INK (KITS) ×2 IMPLANT
NDL HYPO 25X1 1.5 SAFETY (NEEDLE) ×2 IMPLANT
NEEDLE HYPO 25X1 1.5 SAFETY (NEEDLE) ×1 IMPLANT
NS IRRIG 1000ML POUR BTL (IV SOLUTION) ×2 IMPLANT
PACK BASIN DAY SURGERY FS (CUSTOM PROCEDURE TRAY) ×2 IMPLANT
PENCIL SMOKE EVACUATOR (MISCELLANEOUS) ×2 IMPLANT
SLEEVE SCD COMPRESS KNEE MED (STOCKING) ×2 IMPLANT
SPIKE FLUID TRANSFER (MISCELLANEOUS) IMPLANT
SPONGE T-LAP 4X18 ~~LOC~~+RFID (SPONGE) ×2 IMPLANT
SUT MNCRL AB 4-0 PS2 18 (SUTURE) ×2 IMPLANT
SUT SILK 2 0 SH (SUTURE) IMPLANT
SUT VICRYL 3-0 CR8 SH (SUTURE) ×2 IMPLANT
SYR CONTROL 10ML LL (SYRINGE) ×2 IMPLANT
TOWEL GREEN STERILE FF (TOWEL DISPOSABLE) ×2 IMPLANT
TRAY FAXITRON CT DISP (TRAY / TRAY PROCEDURE) ×2 IMPLANT
TUBE CONNECTING 20X1/4 (TUBING) IMPLANT
YANKAUER SUCT BULB TIP NO VENT (SUCTIONS) IMPLANT

## 2023-02-26 NOTE — Transfer of Care (Signed)
Immediate Anesthesia Transfer of Care Note  Patient: Monica Rogers  Procedure(s) Performed: RIGHT BREAST BRACKETED SEED LUMPECTOMY (Right: Breast)  Patient Location: PACU  Anesthesia Type:General  Level of Consciousness: awake, alert , and oriented  Airway & Oxygen Therapy: Patient Spontanous Breathing and Patient connected to face mask oxygen  Post-op Assessment: Report given to RN and Post -op Vital signs reviewed and stable  Post vital signs: Reviewed and stable  Last Vitals:  Vitals Value Taken Time  BP    Temp    Pulse 84 02/26/23 1107  Resp 14 02/26/23 1107  SpO2 98 % 02/26/23 1107  Vitals shown include unfiled device data.  Last Pain:  Vitals:   02/26/23 0930  TempSrc: Oral  PainSc: 0-No pain         Complications: No notable events documented.

## 2023-02-26 NOTE — Discharge Instructions (Addendum)
[facility] Office Phone Number [phone]  BREAST BIOPSY/ PARTIAL MASTECTOMY: POST OP INSTRUCTIONS  Always review your discharge instruction sheet given to you by the facility where your surgery was performed.  IF YOU HAVE DISABILITY OR FAMILY LEAVE FORMS, YOU MUST BRING THEM TO THE OFFICE FOR PROCESSING.  DO NOT GIVE THEM TO YOUR DOCTOR.  A prescription for pain medication may be given to you upon discharge.  Take your pain medication as prescribed, if needed.  If narcotic pain medicine is not needed, then you may take acetaminophen (Tylenol) or ibuprofen (Advil) as needed. Take your usually prescribed medications unless otherwise directed If you need a refill on your pain medication, please contact your pharmacy.  They will contact our office to request authorization.  Prescriptions will not be filled after 5pm or on week-ends. You should eat very light the first 24 hours after surgery, such as soup, crackers, pudding, etc.  Resume your normal diet the day after surgery. Most patients will experience some swelling and bruising in the breast.  Ice packs and a good support bra will help.  Swelling and bruising can take several days to resolve.  It is common to experience some constipation if taking pain medication after surgery.  Increasing fluid intake and taking a stool softener will usually help or prevent this problem from occurring.  A mild laxative (Milk of Magnesia or Miralax) should be taken according to package directions if there are no bowel movements after 48 hours. Unless discharge instructions indicate otherwise, you may remove your bandages 24-48 hours after surgery, and you may shower at that time.  You may have steri-strips (small skin tapes) in place directly over the incision.  These strips should be left on the skin for 7-10 days.  If your surgeon used skin glue on the incision, you may shower in 24 hours.  The glue will flake off over the next 2-3 weeks.  Any  sutures or staples will be removed at the office during your follow-up visit. ACTIVITIES:  You may resume regular daily activities (gradually increasing) beginning the next day.  Wearing a good support bra or sports bra minimizes pain and swelling.  You may have sexual intercourse when it is comfortable. You may drive when you no longer are taking prescription pain medication, you can comfortably wear a seatbelt, and you can safely maneuver your car and apply brakes. RETURN TO WORK:  ______________________________________________________________________________________ Monica Rogers should see your doctor in the office for a follow-up appointment approximately two weeks after your surgery.  Your doctor's nurse will typically make your follow-up appointment when she calls you with your pathology report.  Expect your pathology report 2-3 business days after your surgery.  You may call to check if you do not hear from [place] after three days. OTHER INSTRUCTIONS: _______________________________________________________________________________________________ _____________________________________________________________________________________________________________________________________ _____________________________________________________________________________________________________________________________________ _____________________________________________________________________________________________________________________________________  WHEN TO CALL YOUR DOCTOR: Fever over 101.0 Nausea and/or vomiting. Extreme swelling or bruising. Continued bleeding from incision. Increased pain, redness, or drainage from the incision.  The clinic staff is available to answer your questions during regular business hours.  Please don't hesitate to call and ask to speak to one of the nurses for clinical concerns.  If you have a medical emergency, go to the nearest emergency room or call 911.  A surgeon from [facility] is always on call at the hospital.  For further questions, please visit [email or web address]    You may have Tylenol again after 3:30pm today, if needed.   Post Anesthesia Home  Care Instructions  Activity: Get plenty of rest for the remainder of the day. A responsible individual must stay with you for 24 hours following the procedure.  For the next 24 hours, DO NOT: -Drive a car -Advertising copywriter -Drink alcoholic beverages -Take any medication unless instructed by your physician -Make any legal decisions or sign important papers.  Meals: Start with liquid foods such as gelatin or soup. Progress to regular foods as tolerated. Avoid greasy, spicy, heavy foods. If nausea and/or vomiting occur, drink only clear liquids until the nausea and/or vomiting subsides. Call your physician if vomiting continues.  Special Instructions/Symptoms: Your throat may feel dry or sore from the anesthesia or the breathing tube placed in your throat during surgery. If this causes discomfort, gargle with warm salt water. The discomfort should disappear within 24 hours.  If you had a scopolamine patch placed behind your ear for the management of post- operative nausea and/or vomiting:  1. The medication in the patch is effective for 72 hours, after which it should be removed.  Wrap patch in a tissue and discard in the trash. Wash hands thoroughly with soap and water. 2. You may remove the patch earlier than 72 hours if you experience unpleasant side effects which may include dry mouth, dizziness or visual disturbances. 3. Avoid touching the patch. Wash your hands with soap and water after contact with the patch.

## 2023-02-26 NOTE — Anesthesia Preprocedure Evaluation (Addendum)
Anesthesia Evaluation  Patient identified by MRN, date of birth, ID band Patient awake    Reviewed: Allergy & Precautions, NPO status , Patient's Chart, lab work & pertinent test results  History of Anesthesia Complications Negative for: history of anesthetic complications  Airway Mallampati: II  TM Distance: >3 FB Neck ROM: Full    Dental  (+) Chipped,    Pulmonary neg pulmonary ROS   Pulmonary exam normal        Cardiovascular hypertension, Normal cardiovascular exam     Neuro/Psych  Headaches  Anxiety        GI/Hepatic negative GI ROS, Neg liver ROS,,,  Endo/Other  Hypothyroidism    Renal/GU negative Renal ROS  negative genitourinary   Musculoskeletal  (+) Arthritis ,    Abdominal   Peds  Hematology negative hematology ROS (+)   Anesthesia Other Findings Right breast ca  Reproductive/Obstetrics negative OB ROS                              Anesthesia Physical Anesthesia Plan  ASA: 2  Anesthesia Plan: General   Post-op Pain Management: Tylenol PO (pre-op)*   Induction: Intravenous  PONV Risk Score and Plan: 3 and Treatment may vary due to age or medical condition, Dexamethasone and Ondansetron  Airway Management Planned: LMA  Additional Equipment: None  Intra-op Plan:   Post-operative Plan: Extubation in OR  Informed Consent: I have reviewed the patients History and Physical, chart, labs and discussed the procedure including the risks, benefits and alternatives for the proposed anesthesia with the patient or authorized representative who has indicated his/her understanding and acceptance.     Dental advisory given  Plan Discussed with: CRNA  Anesthesia Plan Comments:         Anesthesia Quick Evaluation

## 2023-02-26 NOTE — H&P (Signed)
History of Present Illness: Monica Rogers is a 72 y.o. female who is seen today as an office consultation for evaluation of Breast Cancer  Patient presents the [facility] for evaluation of multifocal right breast cancer. This was picked up on mammogram ultrasound. She has a 9 mm 6 mm mass that are in the right lower outer quadrant. The 9 mm mass is grade 2 IDC triple positive while the 0.6 cm mass is grade 2 IDC ER positive PR positive HER2/neu negative. She has no complaints.  Review of Systems: A complete review of systems was obtained from the patient. I have reviewed this information and discussed as appropriate with the patient. See HPI as well for other ROS.    Medical History: Past Medical History:  Diagnosis Date  Anemia  History of cancer  Hyperlipidemia  Hypertension   Patient Active Problem List  Diagnosis  Malignant neoplasm of lower-outer quadrant of left breast of female, estrogen receptor positive (CMS/HHS-HCC)  Hypertension  HLD (hyperlipidemia)   Past Surgical History:  Procedure Laterality Date  Right Breast Biopsy Right 01/01/2023  also 12/25/2022    Allergies  Allergen Reactions  Grass Pollen-Perennial Rye, Standard Cough  Lactobacillus Acidoph-Lactase Cough  Wheat Cough  Wheat Bran Cough   Current Outpatient Medications on File Prior to Visit  Medication Sig Dispense Refill  ARMOUR THYROID 120 mg tablet Take 120 mg by mouth once daily  estradioL (CLIMARA) 0.05 mg/24 hr patch Place 1 patch onto the skin once a week   No current facility-administered medications on file prior to visit.   Family History  Problem Relation Age of Onset  Hyperlipidemia (Elevated cholesterol) Father  Diabetes Brother    Social History   Tobacco Use  Smoking Status Never  Smokeless Tobacco Never    Social History   Socioeconomic History  Marital status: Married  Tobacco Use  Smoking status: Never  Smokeless tobacco: Never  Vaping Use  Vaping status: Never Used   Substance and Sexual Activity  Alcohol use: Never  Drug use: Never   Social Determinants of Health   Financial Resource Strain: Low Risk (04/26/2022)  Received from [facility]  Overall Financial Resource Strain (CARDIA)  Difficulty of Paying Living Expenses: Not hard at all  Food Insecurity: No Food Insecurity (04/26/2022)  Received from [facility]  Hunger Vital Sign  Worried About Running Out of Food in the Last Year: Never true  Ran Out of Food in the Last Year: Never true  Transportation Needs: No Transportation Needs (04/26/2022)  Received from [facility] - Transportation  Lack of Transportation (Medical): No  Lack of Transportation (Non-Medical): No  Physical Activity: Sufficiently Active (04/26/2022)  Received from [facility]  Exercise Vital Sign  Days of Exercise per Week: 5 days  Minutes of Exercise per Session: 30 min  Stress: No Stress Concern Present (04/26/2022)  Received from [facility] - Occupational Stress Questionnaire  Feeling of Stress : Not at all  Social Connections: Socially Integrated (04/26/2022)  Received from [facility]  Social Connection and Isolation Panel [NHANES]  Frequency of Communication with Friends and Family: More than three times a week  Frequency of Social Gatherings with Friends and Family: More than three times a week  Attends Religious Services: More than 4 times per year  Active Member of [employer] or Organizations: Yes  Attends Engineer, structural: More than 4 times per year  Marital Status: Married   Objective:  There were no  vitals filed for this visit.  There is no height or weight on file to calculate BMI.  Physical Exam Exam conducted with a chaperone present.  HENT:  Head: Normocephalic.  Cardiovascular:  Rate and Rhythm: Normal rate.  Pulmonary:  Effort: Pulmonary effort is normal.  Chest:  Breasts: Right: Normal.  Left: Normal.  Musculoskeletal:   General: Normal range of motion.  Lymphadenopathy:  Upper Body:  Right upper body: No supraclavicular or axillary adenopathy.  Left upper body: No supraclavicular or axillary adenopathy.  Skin: General: Skin is warm.  Neurological:  General: No focal deficit present.  Mental Status: She is alert.  Psychiatric:  Mood and Affect: Mood normal.  Behavior: Behavior normal.     Labs, Imaging and Diagnostic Testing:  Diagnosis Breast, right, needle core biopsy, 7:00 INVASIVE DUCTAL CARCINOMA, WITH MICROPAPILLARY FEATURES TUBULE FORMATION: SCORE 3 NUCLEAR PLEOMORPHISM: SCORE 2 MITOTIC COUNT: SCORE 1 TOTAL SCORE: 6 OVERALL GRADE: 2 LYMPHOVASCULAR INVASION: NOT IDENTIFIED CANCER LENGTH: 7 MM CALCIFICATIONS: NOT IDENTIFIED OTHER FINDINGS: NONE SEEN NOTE Diagnosis Note Dr. Luisa Hart reviewed the case and concurs with the interpretation. A breast prognostic profile (ER and PR) is pending and will be reported in an addendum. [facility] was notified on 01/02/2023. Lance Coon MD Pathologist, Electronic Signature (Case signed 01/02/2023) Specimen Gross and Clinical Information Specimen Comment TIF: 2:15 pm, CIT: < 30 sec, mass Specimen(s) Obtained: Breast, right, needle core biopsy, 7:00 1 of 3 FINAL for Rogers, Monica A [phone]) Specimen Clinical Information C/F IDC Gross The specimen is received in formalin labeled with the patient's name and right breast 7 o'clock and consists of three cores of tan yellow fibroadipose tissue, ranging from 1.6 x 0.2 x 0.1 cm to 2.2 x 0.2 x 0.1 cm. The specimen is entirely submitted in one cassette. Time in formalin 2:15 p.m. on 01/01/23. CIT less than 30 seconds. (KL:kh 01/02/23) Stain(s) used in Diagnosis: The following stain(s) were used in diagnosing the case: Her2 FISH. The control(s) stained appropriately. ADDITIONAL INFORMATION: 1A. Breast,right,needle core biopsy, 7:00/f FLUORESCENCE IN-SITU  HYBRIDIZATION Results: GROUP 5: HER2 **NEGATIVE** Equivocal form of amplification of the HER2 gene was detected in the IHC 2+ tissue sample received from this individual. HER2 FISH was performed by a technologist and cell imaging and analysis on the [employer]. RATIO OF HER2/CEN17 SIGNALS 1.31 AVERAGE HER2 COPY NUMBER PER CELL 3.55 The ratio of HER2/CEN 17 is within the range < 2.0 of HER2/CEN 17 and a copy number of HER2 signals per cell is <4.0. Arch Pathol Lab Med 1:1,2018 Jerene Bears MD Pathologist, Electronic Signature ( Signed 01/09/2023) 1)Breast, right,needle core biopsy, 7:00 PROGNOSTIC INDICATORS Results: IMMUNOHISTOCHEMICAL AND MORPHOMETRIC ANALYSIS PERFORMED MANUALLY The tumor cells are EQUIVOICAL for Her2 (2+). Her 2 Fish will be performed and results sent separately Estrogen Receptor: 100%, POSITIVE, STRONG STAINING INTENSITY Progesterone Receptor: 80%, POSITIVE, STRONG STAINING INTENSITY Proliferation Marker Ki67: 20% REFERENCE RANGE ESTROGEN RECEPTOR NEGATIVE 0% POSITIVE =>1% 2 of 3 FINAL for Woerner, Giulia A ([id number]) ADDITIONAL INFORMATION:(continued) REFERENCE RANGE PROGESTERONE RECEPTOR NEGATIVE 0% POSITIVE =>1% All controls stained appropriately Jimmy Picket MD Pathologist, Electronic Signature  CLINICAL DATA: Screening recall for a possible left breast asymmetry and possible right breast masses.  This patient has a significant history for core needle biopsies of 2 right breast masses in 2019. Patient was a recall from screening from an exam dated 12/09/2017 for possible right breast masses. Diagnostic imaging on 12/13/2017 revealed a bilobed mass in the right breast at 9 o'clock, felt to  be probably benign with short-term follow-up recommended. She underwent follow-up diagnostic imaging on 06/16/2018 at which time sonography demonstrated a 7 mm irregular lesion at 8:30 o'clock and a small lesion at 9 o'clock. Ultrasound-guided core needle biopsy  of these lesions was recommended. Patient underwent biopsy of lesions on 06/20/2018 with benign concordant results. On 06/15/2018 she was reassessed for bruising related to the biopsy. At that time she had a large right breast hematoma. This was reassessed with ultrasound on 08/22/2018 with persistence of the large hematoma. See subsequently developed large area of fat necrosis and associated dystrophic calcifications in the right breast.  EXAM: DIGITAL DIAGNOSTIC BILATERAL MAMMOGRAM WITH TOMOSYNTHESIS; ULTRASOUND RIGHT BREAST LIMITED; ULTRASOUND LEFT BREAST LIMITED  TECHNIQUE: Bilateral digital diagnostic mammography and breast tomosynthesis was performed.; Targeted ultrasound examination of the right breast was performed; Targeted ultrasound examination of the left breast was performed.  COMPARISON: Previous exam(s).  ACR Breast Density Category b: There are scattered areas of fibroglandular density.  FINDINGS: In the right breast, 2 adjacent small irregular masses persist in the anterior breast on spot compression imaging. These lie anterior to changes subsequent to the large right breast hematoma.  In the left breast, a small oval mass persists in the lateral breast. Mass is smoothly circumscribed.  Targeted right breast ultrasound is performed, showing 2 adjacent hypoechoic irregular masses in the right breast at 7 o'clock, 1 cm from the nipple, the larger measuring 9 x 5 x 8 mm, smaller, which lies just anterior, but within 5-6 mm, measuring 6 mm where it is best defined in the anti radial projection. Neither demonstrates internal blood flow on color Doppler analysis. Sonographic imaging of the right axilla demonstrates normal lymph nodes. No enlarged or abnormal lymph nodes.  Targeted left breast ultrasound is performed, showing 3 small cysts at 2 o'clock, 2 cm the nipple, 1 superficial to the other, both measuring 4 mm. No solid masses or suspicious  lesions.  IMPRESSION: 1. Two adjacent small hypoechoic irregular masses the right breast at 7 o'clock, 1 cm from the nipple, anterior depth. Tissue sampling is recommended the more posterior of these, which is larger and better defined. These masses correspond to the masses seen mammographically. No left axillary lymphadenopathy. 2. Two small benign left breast cysts, 1 of which accounts for the mammographic mass/asymmetry. No evidence of left breast malignancy.  RECOMMENDATION: 1. Ultrasound-guided core needle biopsy of the 9 mm mass in the right breast at 7 o'clock, 1 cm from the nipple. This procedure was scheduled prior to the patient being discharged from the Breast Center. Patient may choose not to undergo this biopsy, since she had the significant complication from the previous biopsies. If she declines biopsy, then six-month follow-up right breast mammography and ultrasound would be recommended.  I have discussed the findings and recommendations with the patient. If applicable, a reminder letter will be sent to the patient regarding the next appointment.  BI-RADS CATEGORY 4: Suspicious.   Electronically Signed By: Amie Portland M.D. On: 12/14/2022 11:48  Assessment and Plan:  Right breast cancer multifocal measuring 0.9 cm and 0.6 cm. 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 Risk and benefits of surgery reviewed as well as other treatment options and discussion of mastectomy with reconstruction. Risk of bleeding, infection, cosmetic deformity, chronic pain, anesthesia risk, the need for reexcision.The procedure  has been discussed with the patient. Alternatives to surgery have been discussed with the patient. Risks of surgery include bleeding, Infection, Seroma formation, death, and the need for  further surgery. The patient understands and wishes to proceed.  Discussed risk lymphedema, shoulder pain and shoulder stiffness as well from lymph node mapping and the use of mag trace thigh as well as a seed.  Hayden Rasmussen, MD

## 2023-02-26 NOTE — Interval H&P Note (Signed)
History and Physical Interval Note:  02/26/2023 9:26 AM  Monica Rogers  has presented today for surgery, with the diagnosis of RIGHT BREAST CANCER.  The various methods of treatment have been discussed with the patient and family. After consideration of risks, benefits and other options for treatment, the patient has consented to  Procedure(s) with comments: RIGHT BREAST BRACKETED SEED LUMPECTOMY (Right) - PEC BLOCK as a surgical intervention.  The patient's history has been reviewed, patient examined, no change in status, stable for surgery.  I have reviewed the patient's chart and labs.  Questions were answered to the patient's satisfaction.   Pt has chosen to forgo SLN mapping due to potential complications of LE.  Discussed impact on future treatments and outcome with her.  She agrees to forgo SLN mapping  The procedure has been discussed with the patient. Alternatives to surgery have been discussed with the patient.  Risks of surgery include bleeding,  Infection,  Seroma formation, death,  and the need for further surgery.   The patient understands and wishes to proceed.   Kmya Placide A Darah Simkin

## 2023-02-26 NOTE — Op Note (Signed)
Preoperative diagnosis: Multifocal right breast cancer overlapping quadrants ER positive, PR positive, HER2/neu positive  Postoperative diagnosis: Same  Procedure: Bracketed right breast seed localized lumpectomy  Surgeon: Harriette Bouillon, MD  Anesthesia: LMA with 0.25% Marcaine plain  EBL: Minimal  Specimen: Right wrist tissue containing both seeds and both clips verified by Faxitron.  Second seed was removed separately.  Images taken.  All margins shaved and oriented with ink.  Drains: None  IV fluids: As per anesthesia record  Indications for procedure: The patient is a 72 year old female with multifocal right breast cancer.  She was seen in the multidisciplinary clinic.  She did not wish to undergo sentinel lymph node mapping this was reviewed by the team.  She had concerns about lymphedema and shoulder dysfunction and has problems with that shoulder and arm anyway.  We reviewed the pathology and the risk and benefit of lymph node surgery on treatment and overall survival with her.  She opted out of sentinel lymph node mapping due to concerns of shoulder stiffness.The procedure has been discussed with the patient. Alternatives to surgery have been discussed with the patient.  Risks of surgery include bleeding,  Infection,  Seroma formation, death,  and the need for further surgery.   The patient understands and wishes to proceed.      Description of procedure: The patient was met in the holding area questions were answered.  Right breast was marked as the correct site.  Of note she had seeds placed as an outpatient by radiology and these films were available for review.  She was then taken back to the operating room.  She was placed supine upon the operating room table.  After induction of general anesthesia, right breast was prepped and draped in sterile fashion and a timeout was performed.  Proper patient, site and procedure were verified.  The neoprobe was used to identify both seeds in  the right breast.  These are in the lower outer and upper outer quadrants.  A curvilinear incision was made over these areas.  Dissection was carried to encompass both seeds and both clips.  Of note the 1 seed dislodged which is old posterior lateral seed.  This was placed in a collection device.  All tissue around this area was widely excised.  Imaging of the specimen revealed the seed and clip still to be present in the other clip present.  She had other clips in the area of complete surgery.  This was all excised en bloc.  I like to shave all margins and this was done.  And all tissue was oriented with ink.  Irrigation was used and suctioned out.  Local anesthetic infiltrated throughout.  Clips were placed imaging was done which showed the above-mentioned findings.  All tissue was sent to pathology.  Hemostasis achieved with cautery.  Deep tissue planes approximated with 3-0 Vicryl.  4 Monocryl was used to close skin in a subcuticular fashion.  Dermabond applied.  All counts found to be correct.  Breast binder placed.  The patient was awoke extubated taken to recovery in satisfactory condition.

## 2023-02-26 NOTE — Anesthesia Procedure Notes (Signed)
Procedure Name: LMA Insertion Date/Time: 02/26/2023 9:58 AM  Performed by: Karen Kitchens, CRNAPre-anesthesia Checklist: Patient identified, Emergency Drugs available, Suction available and Patient being monitored Patient Re-evaluated:Patient Re-evaluated prior to induction Oxygen Delivery Method: Circle system utilized Preoxygenation: Pre-oxygenation with 100% oxygen Induction Type: IV induction Ventilation: Mask ventilation without difficulty LMA: LMA inserted LMA Size: 4.0 Number of attempts: 1 Airway Equipment and Method: Bite block Placement Confirmation: positive ETCO2 Tube secured with: Tape Dental Injury: Teeth and Oropharynx as per pre-operative assessment

## 2023-02-26 NOTE — Anesthesia Postprocedure Evaluation (Signed)
Anesthesia Post Note  Patient: Monica Rogers  Procedure(s) Performed: RIGHT BREAST BRACKETED SEED LUMPECTOMY (Right: Breast)     Patient location during evaluation: PACU Anesthesia Type: General Level of consciousness: awake and alert Pain management: pain level controlled Vital Signs Assessment: post-procedure vital signs reviewed and stable Respiratory status: spontaneous breathing, nonlabored ventilation and respiratory function stable Cardiovascular status: blood pressure returned to baseline Postop Assessment: no apparent nausea or vomiting Anesthetic complications: no   No notable events documented.  Last Vitals:  Vitals:   02/26/23 1130 02/26/23 1145  BP: 125/68 115/71  Pulse: 81 80  Resp: 15 16  Temp:    SpO2: 93% 93%    Last Pain:  Vitals:   02/26/23 1130  TempSrc:   PainSc: 0-No pain                 Shanda Howells

## 2023-02-27 ENCOUNTER — Encounter (HOSPITAL_BASED_OUTPATIENT_CLINIC_OR_DEPARTMENT_OTHER): Payer: Self-pay | Admitting: Surgery

## 2023-03-03 NOTE — Progress Notes (Signed)
Patient Care Team: Jeani Sow, MD as PCP - General (Family Medicine) Serena Croissant, MD as Consulting Physician (Hematology and Oncology) Harriette Bouillon, MD as Consulting Physician (General Surgery) Dorothy Puffer, MD as Consulting Physician (Radiation Oncology) Pershing Proud, RN as Oncology Nurse Navigator Donnelly Angelica, RN as Oncology Nurse Navigator  DIAGNOSIS: No diagnosis found.  SUMMARY OF ONCOLOGIC HISTORY: Oncology History  Malignant neoplasm of lower-outer quadrant of right breast of female, estrogen receptor positive (HCC)  01/01/2023 Initial Diagnosis   Screening mammogram detected right breast mass: 2 adjacent masses at 7 o'clock position measuring 0.9 cm and 0.6 cm.  Biopsy of 0.9 cm mass: Grade 2 IDC with micropapillary features ER 90%, PR 90%, HER2 positive, Ki-67 20%; biopsy of the 0.6 cm mass is grade 2 IDC with micropapillary features ER 100%, PR 80%, HER2 negative, Ki67 20%, axilla negative   01/09/2023 Cancer Staging   Staging form: Breast, AJCC 8th Edition - Clinical stage from 01/09/2023: Stage IA (cT1b, cN0, cM0, G2, ER+, PR+, HER2+) - Signed by Ronny Bacon, PA-C on 01/09/2023 Stage prefix: Initial diagnosis Method of lymph node assessment: Clinical Histologic grading system: 3 grade system    Genetic Testing   [facility] Custom Panel+RNA was Negative. Report date is 01/17/2023.  The Custom Hereditary Cancers Panel offered by [facility] includes sequencing and/or deletion duplication testing of the following 43 genes: APC, ATM, AXIN2, BAP1, BARD1, BMPR1A, BRCA1, BRCA2, BRIP1, CDH1, CDK4, CDKN2A (p14ARF and p16INK4a only), CHEK2, CTNNA1, EPCAM (Deletion/duplication testing only), FH, GREM1 (promoter region duplication testing only), HOXB13, KIT, MBD4, MEN1, MLH1, MSH2, MSH3, MSH6, MUTYH, NF1, NHTL1, PALB2, PDGFRA, PMS2, POLD1, POLE, PTEN, RAD51C, RAD51D, SMAD4, SMARCA4. STK11, TP53, TSC1, TSC2, and VHL.     CHIEF COMPLIANT: Follow-up after  surgery  INTERVAL HISTORY: Monica Rogers is a 72 y.o. female is here because of recent diagnosis of left breast cancer. She presents to the clinic for a follow-up.    ALLERGIES:  is allergic to dairycare [lactase-lactobacillus], june grass pollen standardized [timothy grass pollen allergen], and wheat.  MEDICATIONS:  Current Outpatient Medications  Medication Sig Dispense Refill   AMBULATORY NON FORMULARY MEDICATION Bone Up 1 tablet in the morning and 2 in the afternoon     ARMOUR THYROID 120 MG tablet TAKE 1 TABLET BY MOUTH EVERY DAY 90 tablet 4   Blood Glucose Monitoring Suppl (ONETOUCH VERIO) w/Device KIT Use to test blood sugar daily 1 kit 0   butalbital-acetaminophen-caffeine (FIORICET) 50-325-40 MG tablet Take 1 tablet by mouth every 6 (six) hours as needed for headache. 14 tablet 0   CALCIUM PO Take 1 tablet by mouth daily.     Carboxymethylcellulose Sodium (EYE DROPS OP) Apply 2 drops to eye 2 (two) times daily as needed (dry eyes).     CHONDROITIN SULFATE PO Take 1 tablet by mouth daily.     clotrimazole-betamethasone (LOTRISONE) cream Apply a small amount to affected area 1-2 times per day for 14 days, then stop and use as needed.     Coenzyme Q10 (CO Q-10 PO) Take 1 tablet by mouth in the morning and at bedtime.     dexamethasone (DECADRON) 0.1 % ophthalmic solution SMARTSIG:2 Drop(s) In Ear(s)     Digestive Enzymes (ENZYME DIGEST) CAPS Take 2 capsules by mouth daily.     estradiol (ESTRACE) 0.1 MG/GM vaginal cream Place 1 Applicatorful vaginally 2 (two) times a week.     glucose blood (ONETOUCH VERIO) test strip Use as instructed 100 each 2  Lancets (ONETOUCH ULTRASOFT) lancets Use as instructed 100 each 2   Magnesium 110 MG CAPS Take 2 capsules by mouth 2 (two) times daily.     olmesartan (BENICAR) 20 MG tablet Take 1 tablet (20 mg total) by mouth daily. 90 tablet 3   Omega-3 Fatty Acids (FISH OIL PO) Take 1 tablet by mouth 2 (two) times daily.     OVER THE COUNTER  MEDICATION Take 1 tablet by mouth daily. Bone up bone supplement     oxyCODONE (OXY IR/ROXICODONE) 5 MG immediate release tablet Take 1 tablet (5 mg total) by mouth every 6 (six) hours as needed for severe pain. 15 tablet 0   phenazopyridine (PYRIDIUM) 200 MG tablet Take 1 tablet (200 mg total) by mouth 3 (three) times daily as needed for pain. 15 tablet 0   QUERCETIN PO Take 2 tablets by mouth 3 (three) times daily.     VITAMIN D, CHOLECALCIFEROL, PO Take 10,000 Units by mouth daily.      Zinc Sulfate 66 MG TABS Take 132 mg by mouth daily.     No current facility-administered medications for this visit.    PHYSICAL EXAMINATION: ECOG PERFORMANCE STATUS: {CHL ONC ECOG PS:[phone]}  There were no vitals filed for this visit. There were no vitals filed for this visit.  BREAST:*** No palpable masses or nodules in either right or left breasts. No palpable axillary supraclavicular or infraclavicular adenopathy no breast tenderness or nipple discharge. (exam performed in the presence of a chaperone)  LABORATORY DATA:  I have reviewed the data as listed    Latest Ref Rng & Units 01/09/2023   12:19 PM 11/29/2022   11:38 AM 08/27/2022   11:36 AM  CMP  Glucose 70 - 99 mg/dL 841   324   BUN 8 - 23 mg/dL 15   15   Creatinine 4.01 - 1.00 mg/dL 0.27   2.53   Sodium 664 - 145 mmol/L 140   139   Potassium 3.5 - 5.1 mmol/L 4.2   3.9   Chloride 98 - 111 mmol/L 104   101   CO2 22 - 32 mmol/L 29   28   Calcium 8.9 - 10.3 mg/dL 40.3   9.8   Total Protein 6.5 - 8.1 g/dL 7.6  7.1  7.0   Total Bilirubin 0.3 - 1.2 mg/dL 0.7  0.7  0.5   Alkaline Phos 38 - 126 U/L 73  64  66   AST 15 - 41 U/L 52  36  60   ALT 0 - 44 U/L 77  57  92     Lab Results  Component Value Date   WBC 8.2 01/09/2023   HGB 14.9 01/09/2023   HCT 45.4 01/09/2023   MCV 96.0 01/09/2023   PLT 283 01/09/2023   NEUTROABS 5.4 01/09/2023    ASSESSMENT & PLAN:  No problem-specific Assessment & Plan notes found for this  encounter.    No orders of the defined types were placed in this encounter.  The patient has a good understanding of the overall plan. she agrees with it. she will call with any problems that may develop before the next visit here. Total time spent: 30 mins including face to face time and time spent for planning, charting and co-ordination of care   Sherlyn Lick, CMA 03/03/23    I Janan Ridge am acting as a Neurosurgeon for [employer]  ***

## 2023-03-04 ENCOUNTER — Telehealth: Payer: Self-pay | Admitting: *Deleted

## 2023-03-04 ENCOUNTER — Encounter: Payer: Self-pay | Admitting: *Deleted

## 2023-03-04 ENCOUNTER — Encounter: Payer: Self-pay | Admitting: Surgery

## 2023-03-04 NOTE — Telephone Encounter (Signed)
Requested repeat prognostics from pathology 

## 2023-03-06 ENCOUNTER — Encounter: Payer: Self-pay | Admitting: *Deleted

## 2023-03-06 ENCOUNTER — Encounter (INDEPENDENT_AMBULATORY_CARE_PROVIDER_SITE_OTHER): Payer: Self-pay

## 2023-03-07 ENCOUNTER — Other Ambulatory Visit: Payer: Self-pay

## 2023-03-07 ENCOUNTER — Inpatient Hospital Stay
Payer: No Typology Code available for payment source | Attending: Hematology and Oncology | Admitting: Hematology and Oncology

## 2023-03-07 ENCOUNTER — Telehealth: Payer: Self-pay | Admitting: *Deleted

## 2023-03-07 ENCOUNTER — Encounter: Payer: Self-pay | Admitting: *Deleted

## 2023-03-07 VITALS — BP 135/71 | HR 77 | Temp 97.7°F | Resp 18 | Ht 64.0 in | Wt 208.2 lb

## 2023-03-07 DIAGNOSIS — Z170 Estrogen receptor positive status [ER+]: Secondary | ICD-10-CM | POA: Diagnosis not present

## 2023-03-07 DIAGNOSIS — C50511 Malignant neoplasm of lower-outer quadrant of right female breast: Secondary | ICD-10-CM

## 2023-03-07 NOTE — Assessment & Plan Note (Addendum)
02/26/2023: Right lumpectomy: Grade 2 IDC with micropapillary features 1.8 cm, margins negative, lymphovascular invasion not identified, ER 100%, PR 80%, Ki67 20%, HER2 negative Smaller mass: ER 90%, PR 90%, Ki67 20%, HER2 positive Repeat prognostic panel ER 90%, PR 85%, Ki67 10%, HER2 1+  Pathology counseling: I discussed the final pathology report of the patient provided  a copy of this report. I discussed the margins as well as lymph node surgeries. We also discussed the final staging along with previously performed and new ER/PR and HER-2/neu testing.  Treatment plan: Oncotype DX testing Adjuvant radiation therapy: Patient is not entirely keen on doing radiation but will be happy to discuss with radiation oncology and make a final decision. Followed by adjuvant antiestrogen therapy  Return to clinic based upon Oncotype DX test result

## 2023-03-07 NOTE — Telephone Encounter (Signed)
Received order for oncotype testing. Requisition sent to pathology 

## 2023-03-14 ENCOUNTER — Encounter (HOSPITAL_COMMUNITY): Payer: Self-pay

## 2023-03-14 DIAGNOSIS — C50511 Malignant neoplasm of lower-outer quadrant of right female breast: Secondary | ICD-10-CM | POA: Diagnosis not present

## 2023-03-14 DIAGNOSIS — Z170 Estrogen receptor positive status [ER+]: Secondary | ICD-10-CM | POA: Diagnosis not present

## 2023-03-15 ENCOUNTER — Telehealth: Payer: Self-pay | Admitting: *Deleted

## 2023-03-15 ENCOUNTER — Encounter (HOSPITAL_COMMUNITY): Payer: Self-pay

## 2023-03-15 ENCOUNTER — Encounter: Payer: Self-pay | Admitting: *Deleted

## 2023-03-15 DIAGNOSIS — C50511 Malignant neoplasm of lower-outer quadrant of right female breast: Secondary | ICD-10-CM

## 2023-03-15 NOTE — Telephone Encounter (Signed)
Received oncotype results of 13/4%. Patient aware. Referral placed for Dr. Mitzi Hansen.

## 2023-03-18 ENCOUNTER — Telehealth: Payer: Self-pay | Admitting: Radiation Oncology

## 2023-03-18 ENCOUNTER — Ambulatory Visit: Payer: No Typology Code available for payment source | Admitting: Physical Therapy

## 2023-03-18 NOTE — Telephone Encounter (Signed)
Patient returned call to schedule a consultation w. Jill Side. Patient stated she does not want to schedule her treatment planning at this time, prefers to speak with provider prior to scheduling.

## 2023-03-18 NOTE — Telephone Encounter (Signed)
Called patient to schedule a consultation w. Bryson Ha. No answer, LVM for a return call.

## 2023-03-21 ENCOUNTER — Encounter: Payer: Self-pay | Admitting: *Deleted

## 2023-03-26 ENCOUNTER — Telehealth: Payer: Self-pay | Admitting: Radiation Oncology

## 2023-03-26 NOTE — Telephone Encounter (Signed)
8/20 @ 10:48 am Patient called to reschedule her consult to later date/time due to having other appointments and also requested for consult to be over the phone.

## 2023-03-27 ENCOUNTER — Ambulatory Visit: Payer: No Typology Code available for payment source | Admitting: Radiation Oncology

## 2023-03-27 ENCOUNTER — Ambulatory Visit: Payer: No Typology Code available for payment source

## 2023-03-27 DIAGNOSIS — C50511 Malignant neoplasm of lower-outer quadrant of right female breast: Secondary | ICD-10-CM

## 2023-04-02 ENCOUNTER — Encounter: Payer: Self-pay | Admitting: *Deleted

## 2023-04-03 ENCOUNTER — Encounter: Payer: Self-pay | Admitting: Radiation Oncology

## 2023-04-03 ENCOUNTER — Ambulatory Visit
Admission: RE | Admit: 2023-04-03 | Discharge: 2023-04-03 | Disposition: A | Discharge status: Home / Self Care | Payer: No Typology Code available for payment source | Source: Ambulatory Visit | Attending: Radiation Oncology | Admitting: Radiation Oncology

## 2023-04-03 VITALS — Ht 64.0 in | Wt 210.0 lb

## 2023-04-03 DIAGNOSIS — C50511 Malignant neoplasm of lower-outer quadrant of right female breast: Secondary | ICD-10-CM

## 2023-04-03 DIAGNOSIS — Z170 Estrogen receptor positive status [ER+]: Secondary | ICD-10-CM

## 2023-04-03 DIAGNOSIS — C50512 Malignant neoplasm of lower-outer quadrant of left female breast: Secondary | ICD-10-CM | POA: Diagnosis not present

## 2023-04-03 NOTE — Progress Notes (Signed)
Radiation Oncology         [phone] ________________________________  Outpatient Follow Up - Conducted via telephone at patient request.  I spoke with the patient to conduct this visit via telephone. The patient was notified in advance and was offered an in person or telemedicine meeting to allow for face to face communication but instead preferred to proceed with a telephone call.   Name: Monica Rogers        MRN: [id number]  Date of Service: 04/03/2023 DOB: 12-30-1950  WU:[id number], Monica Hancock, MD  Serena Croissant, MD     REFERRING PHYSICIAN: Serena Croissant, MD   DIAGNOSIS: The encounter diagnosis was Malignant neoplasm of lower-outer quadrant of right breast of female, estrogen receptor positive (HCC).   HISTORY OF PRESENT ILLNESS: Monica Rogers is a 72 y.o. female originally seen in the multidisciplinary breast clinic for a new diagnosis of right breast cancer. The patient was noted to have screening detected abnormality  in the right breast in the lower outer quadrant.  Diagnostic imaging identified 2 areas in the 7:00 axis, one measuring 9 mm the other measuring 6 mm.  Her axilla was negative for adenopathy.  She underwent biopsies on 12/25/2022.  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    Since her last visit, she underwent a lumpectomy on 02/26/23 and this showed a grade 2 invasive ductal carcinoma that was 1.8 cm in greatest dimension, described further in the pathology report as the anterior specimen. This was retested and was ER/PR positive, HER2 negative with a Ki 67 of 10%. The additional lesion in the specimen called posterior in the  body of the report reflects DCIS and the inferior margin was the closest at 2 mm but further tissue was negative for this margin. Given that the retesting for prognostics, there is no recommendation of chemotherapy based on prognostics, however Oncotype Dx was submitted and was 13, thus no chemotherapy is recommended. She's seen today to discuss the role of adjuvant radiation.     PREVIOUS RADIATION THERAPY: No   PAST MEDICAL HISTORY:  Past Medical History:  Diagnosis Date   Allergy    Anemia    Arthritis    Blood transfusion without reported diagnosis    Chronic headaches    Diverticulosis    Hyperlipidemia    Hypertension    Hypothyroidism    IBS (irritable bowel syndrome)    Prediabetes    Recurrent UTI        PAST SURGICAL HISTORY: Past Surgical History:  Procedure Laterality Date   BREAST BIOPSY Right 2019   BREAST BIOPSY Right 12/25/2022   [place] RT BREAST BX W LOC DEV 1ST LESION IMG BX SPEC US GUIDE 12/25/2022 [facility]   BREAST BIOPSY Right 01/01/2023   [place] RT BREAST BX W LOC DEV 1ST LESION IMG BX SPEC US GUIDE 01/01/2023 [facility]   BREAST BIOPSY  02/25/2023   MM RT RADIOACTIVE SEED LOC MAMMO GUIDE 02/25/2023 [facility]   BREAST BIOPSY  02/25/2023   MM RT RADIOACTIVE SEED EA ADD LESION LOC MAMMO GUIDE 02/25/2023 [facility]   BREAST LUMPECTOMY WITH RADIOACTIVE SEED LOCALIZATION Right  02/26/2023   Procedure: RIGHT BREAST BRACKETED SEED LUMPECTOMY;  Surgeon: Harriette Bouillon, MD;  Location: [facility];  Service: General;  Laterality: Right;   IR ANGIOGRAM SELECTIVE EACH ADDITIONAL VESSEL  09/28/2020   IR ANGIOGRAM SELECTIVE EACH ADDITIONAL VESSEL  09/28/2020   IR ANGIOGRAM SELECTIVE EACH ADDITIONAL VESSEL  09/28/2020   IR ANGIOGRAM SELECTIVE EACH ADDITIONAL VESSEL  09/28/2020   IR ANGIOGRAM VISCERAL SELECTIVE  09/28/2020   IR ANGIOGRAM VISCERAL SELECTIVE  09/28/2020   IR EMBO ART  VEN HEMORR LYMPH EXTRAV  INC GUIDE ROADMAPPING   09/28/2020   IR US GUIDE VASC ACCESS RIGHT  09/28/2020   TONSILLECTOMY       FAMILY HISTORY:  Family History  Problem Relation Age of Onset   Hearing loss Mother    Miscarriages / [place] Mother    Alcohol abuse Father    Hearing loss Father    Hypertension Father    Diabetes Brother    Cancer Maternal Grandmother        possibly had breast cancer dx. in her 30s   Diabetes Paternal Grandmother      SOCIAL HISTORY:  reports that she has never smoked. She has never used smokeless tobacco. She reports that she does not drink alcohol and does not use drugs.  The patient is married and lives in [place].  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 She has two adult children and is originally from [place].    ALLERGIES: Lactase-lactobacillus, Timothy grass pollen allergen, and Wheat   MEDICATIONS:  Current Outpatient Medications  Medication Sig Dispense Refill   AMBULATORY NON FORMULARY MEDICATION Bone Up 1 tablet in the morning and 2 in the afternoon     ARMOUR THYROID 120 MG tablet TAKE 1 TABLET BY MOUTH EVERY DAY 90 tablet 4   Ascorbic Acid (VITAMIN C) 100 MG CHEW      B Complex Vitamins (VITAMIN B COMPLEX 100 IJ)      Blood Glucose Monitoring Suppl (ONETOUCH VERIO FLEX SYSTEM) DEVI USE TO TEST BLOOD SUGAR DAILY     Blood Glucose Monitoring Suppl (ONETOUCH VERIO) w/Device KIT Use to test blood sugar daily 1 kit 0   butalbital-acetaminophen-caffeine (FIORICET) 50-325-40 MG tablet Take 1 tablet by mouth every 6 (six) hours as needed for headache. 14 tablet 0   CALCIUM PO Take 1 tablet by mouth daily.     Carboxymethylcellulose Sodium (EYE DROPS OP) Apply 2 drops to eye 2 (two) times daily as needed (dry eyes).     Cholecalciferol (VITAMIN D3) 50 MCG (2000 UT) capsule      CHONDROITIN SULFATE PO Take 1 tablet by mouth daily.     Clobetasol Propionate 0.05 % shampoo Apply topically 2 (two) times a week.      clotrimazole-betamethasone (LOTRISONE) cream Apply a small amount to affected area 1-2 times per day for 14 days, then stop and use as needed.     Coenzyme Q10 (CO Q-10 PO) Take 1 tablet by mouth in the morning and at bedtime.     Coenzyme Q10 (COQ-10) 100 MG capsule      dexamethasone (DECADRON) 0.1 % ophthalmic solution SMARTSIG:2 Drop(s) In Ear(s)     Digestive Enzymes (ENZYME DIGEST) CAPS Take 2 capsules by mouth daily.     estradiol (ESTRACE) 0.1 MG/GM vaginal cream Place 1 Applicatorful vaginally 2 (two) times a week.     glucose blood (ONETOUCH VERIO) test strip  Use as instructed 100 each 2   glucose blood test strip See admin instructions.     Lancets (ONETOUCH DELICA PLUS LANCET30G) MISC See admin instructions.     Lancets (ONETOUCH ULTRASOFT) lancets Use as instructed 100 each 2   Magnesium 110 MG CAPS Take 2 capsules by mouth 2 (two) times daily.     Magnesium Gluconate (MAGNESIUM 27 PO)      olmesartan (BENICAR) 20 MG tablet Take 1 tablet (20 mg total) by mouth daily. 90 tablet 3   Omega-3 Fatty Acids (FISH OIL PO) Take 1 tablet by mouth 2 (two) times daily.     Omega-3 Fatty Acids (FISH OIL) 300 MG CAPS      OVER THE COUNTER MEDICATION Take 1 tablet by mouth daily. Bone up bone supplement     oxyCODONE (OXY IR/ROXICODONE) 5 MG immediate release tablet Take 1 tablet (5 mg total) by mouth every 6 (six) hours as needed for severe pain. 15 tablet 0   phenazopyridine (PYRIDIUM) 200 MG tablet Take 1 tablet (200 mg total) by mouth 3 (three) times daily as needed for pain. 15 tablet 0   QUERCETIN PO Take 2 tablets by mouth 3 (three) times daily.     Testosterone 1.62 % GEL      VITAMIN D, CHOLECALCIFEROL, PO Take 10,000 Units by mouth daily.      Zinc Sulfate 66 MG TABS Take 132 mg by mouth daily.     No current facility-administered medications for this encounter.     REVIEW OF SYSTEMS: On review of systems, the patient reports that she is doing well. She is concerned about the  safety of radiation. She has been sore in the right breast since surgery though this is improving somewhat. No other complaints are verbalized.      PHYSICAL EXAM:  Unable to assess due to encounter type    ECOG = 1  0 - Asymptomatic (Fully active, able to carry on all predisease activities without restriction)  1 - Symptomatic but completely ambulatory (Restricted in physically strenuous activity but ambulatory and able to carry out work of a light or sedentary nature. For example, light housework, office work)  2 - Symptomatic, <50% in bed during the day (Ambulatory and capable of all self care but unable to carry out any work activities. Up and about more than 50% of waking hours)  3 - Symptomatic, >50% in bed, but not bedbound (Capable of only limited self-care, confined to bed or chair 50% or more of waking hours)  4 - Bedbound (Completely disabled. Cannot carry on any self-care. Totally confined to bed or chair)  5 - Death   Santiago Glad MM, Creech RH, Tormey DC, et al. [phone]). "Toxicity and response criteria of the [facility]". Am. Evlyn Clines. Oncol. 5 (6): 649-55    LABORATORY DATA:  Lab Results  Component Value Date   WBC 8.2 01/09/2023   HGB 14.9 01/09/2023   HCT 45.4 01/09/2023   MCV 96.0 01/09/2023   PLT 283 01/09/2023   Lab Results  Component Value Date   NA 140 01/09/2023   K 4.2 01/09/2023   CL 104 01/09/2023   CO2 29 01/09/2023   Lab Results  Component Value Date   ALT 77 (H) 01/09/2023   AST 52 (H) 01/09/2023   ALKPHOS 73 01/09/2023   BILITOT 0.7 01/09/2023      RADIOGRAPHY: No results found.     IMPRESSION/PLAN: 1. 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 Dr. Mitzi Hansen has reviewed her final pathology and today I reviewed the nature of early stage breast disease. Her pathology results are somewhat tedious to navigate but it  appears there is no longer a concern for HER2 amplified disease based on repeating her prognostic panel. Dr. Pamelia Hoit is not recommending chemotherapy, and Oncotype Dx score also confirms no role of chemotherapy. She is a candidate for external radiotherapy and Dr. Mitzi Hansen would recommend this treatment to the breast to reduce risks of local recurrence followed by antiestrogen therapy. She may also consider forgoing therapy as she is contemplating her options. She is aware if she forgoes radiation, we would strongly encourage her to consider antiestrogen therapy. We discussed the risks, benefits, short, and long term effects of radiotherapy, as well as the curative intent. I reviewed the delivery and logistics of radiotherapy and that Dr. Mitzi Hansen would offer 4 weeks of radiotherapy to the right breast. She would like to consider her options before committing to therapy. I let her know I'd ask our staff to reach out about simulation and if she would like to schedule we will proceed, but she is also welcome to call back if she has further questions.    This encounter was conducted via telephone.  The patient has provided two factor identification and has given verbal consent for this type of encounter and has been advised to only accept a meeting of this type in a secure network environment. The time spent during this encounter was 45 minutes including preparation, discussion, and coordination of the patient's care. The attendants for this meeting include Ronny Bacon  and [name] and her husband Lonni Thrasher.  During the encounter,   Ronny Bacon were located at [facility] Radiation Oncology Department.  CATRICE BERRIDGE was located at home with her husband Chystal Girty.       Osker Mason, [facility]    **Disclaimer: This note was dictated with voice recognition software. Similar sounding words can inadvertently be transcribed and this note may contain transcription  errors which may not have been corrected upon publication of note.**

## 2023-04-03 NOTE — Progress Notes (Signed)
Nursing interview for Malignant neoplasm of lower-outer quadrant of right breast of female, estrogen receptor positive (HCC). Patient identity verified x2.  Patient reports overall RT breast tenderness 4/10. No other issues conveyed at this time.  Meaningful use complete.  Vitals- Ht 5\' 4"  (1.626 m)   Wt 210 lb (95.3 kg)   BMI 36.05 kg/m   This concludes the interaction.  Ruel Favors, LPN

## 2023-04-04 ENCOUNTER — Encounter: Payer: Self-pay | Admitting: Family Medicine

## 2023-04-04 ENCOUNTER — Ambulatory Visit (INDEPENDENT_AMBULATORY_CARE_PROVIDER_SITE_OTHER): Payer: No Typology Code available for payment source | Admitting: Family Medicine

## 2023-04-04 ENCOUNTER — Ambulatory Visit: Payer: No Typology Code available for payment source | Admitting: Podiatry

## 2023-04-04 VITALS — BP 118/70 | HR 90 | Temp 97.7°F | Resp 18 | Ht 64.0 in | Wt 207.5 lb

## 2023-04-04 DIAGNOSIS — E1165 Type 2 diabetes mellitus with hyperglycemia: Secondary | ICD-10-CM

## 2023-04-04 DIAGNOSIS — E782 Mixed hyperlipidemia: Secondary | ICD-10-CM

## 2023-04-04 DIAGNOSIS — Z170 Estrogen receptor positive status [ER+]: Secondary | ICD-10-CM | POA: Diagnosis not present

## 2023-04-04 DIAGNOSIS — C50511 Malignant neoplasm of lower-outer quadrant of right female breast: Secondary | ICD-10-CM | POA: Diagnosis not present

## 2023-04-04 DIAGNOSIS — I10 Essential (primary) hypertension: Secondary | ICD-10-CM | POA: Diagnosis not present

## 2023-04-04 DIAGNOSIS — G4489 Other headache syndrome: Secondary | ICD-10-CM

## 2023-04-04 MED ORDER — OLMESARTAN MEDOXOMIL 20 MG PO TABS: 20.0000 mg | ORAL_TABLET | Freq: Every day | ORAL | 3 refills | Status: DC

## 2023-04-04 MED ORDER — BUTALBITAL-APAP-CAFFEINE 50-325-40 MG PO TABS: 1.0000 | ORAL_TABLET | Freq: Four times a day (QID) | ORAL | 0 refills | Status: DC | PRN

## 2023-04-04 NOTE — Progress Notes (Signed)
Subjective:     Patient ID: Monica Rogers, female    DOB: 25-Jul-1951, 72 y.o.   MRN: [id number]  Chief Complaint  Patient presents with   Medical Management of Chronic Issues    6 month follow-up on htn, hld Not fasting Pain in top part of gluts that started about a year ago, has been more frequent    HPI  HTN - Pt is on benicar 20 mg. Has not been checking Bp at home, but notes that her Bp has been controlled at visits with her specialists. No ha/dizziness/cp/palp/edema/cough/sob.  DM - She reports her last A1C was 6.9 at oncology in June. Previously her A1C was 6.4 in 11/2022. She has been exercising regularly. She tends to eat plenty carbs, especially bread. States she often eats bread when she eats at restaurants. Also eats pizza at least once a week. Has been trying to be more intentional about limiting carb intake. She has a glucometer at home and plans to start monitor it more. Previously when she would check her sugars at home, it would run in the 120s or lower. Dx w/breast ca in June so diet has not been her focus(understandably)  New dx of breast cancer - She reports a new dx of breast cancer in 2 spots of her right breast. She had lumpectomy about a month ago, 02/26/2023. She has been following up at the [facility].   Upper glute pain - She complains of upper left glute pain, starting about a year ago, but it is recently occurring more frequently.  The pain at time radiates down her left leg. Her pain worsens at the end of the day and when turning over in bed, can be bad.. She sometimes is unable to turn over in bed due to the pain. She also notes her glute is warm to touch at times. She has not done any PT, but last year did pelvic floor exercises. She was seen at [facility] thinking it may be a hip issue. She states that imaging revealed no abnormalities.   HLD - Trig elevated at 200 and LDL elevated at 112, per labs from 11/30/2022. Rosuvastatin was discontinued.    Hypothyroidism - On armour 120 mg daily.  Migraines - She has migraines once every 1.5-2 months. Takes Fioricet as needed and is requesting a refill.   Has been using estrogen cream to help with hx of recurrent UTIs, which has helped significantly.    Health Maintenance Due  Topic Date Due   FOOT EXAM  Never done   OPHTHALMOLOGY EXAM  Never done   Diabetic kidney evaluation - Urine ACR  Never done   DTaP/Tdap/Td (1 - Tdap) Never done   Medicare Annual Wellness (AWV)  04/27/2023    Past Medical History:  Diagnosis Date   Allergy    Anemia    Arthritis    Blood transfusion without reported diagnosis    Breast cancer (HCC) 2024   R   Chronic headaches    Diverticulosis    Hyperlipidemia    Hypertension    Hypothyroidism    IBS (irritable bowel syndrome)    Prediabetes    Recurrent UTI     Past Surgical History:  Procedure Laterality Date   BREAST BIOPSY Right 2019   BREAST BIOPSY Right 12/25/2022   [place] RT BREAST BX W LOC DEV 1ST LESION IMG BX SPEC US GUIDE 12/25/2022 [facility]   BREAST BIOPSY Right 01/01/2023   [place] RT BREAST BX  W LOC DEV 1ST LESION IMG BX SPEC US GUIDE 01/01/2023 [facility]   BREAST BIOPSY  02/25/2023   MM RT RADIOACTIVE SEED LOC MAMMO GUIDE 02/25/2023 [facility]   BREAST BIOPSY  02/25/2023   MM RT RADIOACTIVE SEED EA ADD LESION LOC MAMMO GUIDE 02/25/2023 [facility]   BREAST LUMPECTOMY WITH RADIOACTIVE SEED LOCALIZATION Right 02/26/2023   Procedure: RIGHT BREAST BRACKETED SEED LUMPECTOMY;  Surgeon: Harriette Bouillon, MD;  Location: [facility];  Service: General;  Laterality: Right;   IR ANGIOGRAM SELECTIVE EACH ADDITIONAL VESSEL  09/28/2020   IR ANGIOGRAM SELECTIVE EACH ADDITIONAL VESSEL  09/28/2020   IR ANGIOGRAM SELECTIVE EACH ADDITIONAL VESSEL  09/28/2020   IR ANGIOGRAM SELECTIVE EACH ADDITIONAL VESSEL  09/28/2020   IR ANGIOGRAM VISCERAL SELECTIVE  09/28/2020   IR ANGIOGRAM VISCERAL SELECTIVE  09/28/2020   IR  EMBO ART  VEN HEMORR LYMPH EXTRAV  INC GUIDE ROADMAPPING  09/28/2020   IR US GUIDE VASC ACCESS RIGHT  09/28/2020   TONSILLECTOMY       Current Outpatient Medications:    AMBULATORY NON FORMULARY MEDICATION, Bone Up 1 tablet in the morning and 2 in the afternoon, Disp: , Rfl:    ARMOUR THYROID 120 MG tablet, TAKE 1 TABLET BY MOUTH EVERY DAY, Disp: 90 tablet, Rfl: 4   Ascorbic Acid (VITAMIN C) 100 MG CHEW, , Disp: , Rfl:    B Complex Vitamins (VITAMIN B COMPLEX 100 IJ), , Disp: , Rfl:    Blood Glucose Monitoring Suppl (ONETOUCH VERIO FLEX SYSTEM) DEVI, USE TO TEST BLOOD SUGAR DAILY, Disp: , Rfl:    Blood Glucose Monitoring Suppl (ONETOUCH VERIO) w/Device KIT, Use to test blood sugar daily, Disp: 1 kit, Rfl: 0   CALCIUM PO, Take 1 tablet by mouth daily., Disp: , Rfl:    Carboxymethylcellulose Sodium (EYE DROPS OP), Apply 2 drops to eye 2 (two) times daily as needed (dry eyes)., Disp: , Rfl:    Cholecalciferol (VITAMIN D3) 50 MCG (2000 UT) capsule, , Disp: , Rfl:    CHONDROITIN SULFATE PO, Take 1 tablet by mouth daily., Disp: , Rfl:    Clobetasol Propionate 0.05 % shampoo, Apply topically 2 (two) times a week., Disp: , Rfl:    clotrimazole-betamethasone (LOTRISONE) cream, Apply a small amount to affected area 1-2 times per day for 14 days, then stop and use as needed., Disp: , Rfl:    Coenzyme Q10 (COQ-10) 100 MG capsule, , Disp: , Rfl:    dexamethasone (DECADRON) 0.1 % ophthalmic solution, SMARTSIG:2 Drop(s) In Ear(s), Disp: , Rfl:    Digestive Enzymes (ENZYME DIGEST) CAPS, Take 2 capsules by mouth daily., Disp: , Rfl:    estradiol (ESTRACE) 0.1 MG/GM vaginal cream, Place 1 Applicatorful vaginally 2 (two) times a week., Disp: , Rfl:    glucose blood (ONETOUCH VERIO) test strip, Use as instructed, Disp: 100 each, Rfl: 2   glucose blood test strip, See admin instructions., Disp: , Rfl:    Lancets (ONETOUCH DELICA PLUS LANCET30G) MISC, See admin instructions., Disp: , Rfl:    Lancets (ONETOUCH  ULTRASOFT) lancets, Use as instructed, Disp: 100 each, Rfl: 2   Magnesium 110 MG CAPS, Take 2 capsules by mouth 2 (two) times daily., Disp: , Rfl:    Omega-3 Fatty Acids (FISH OIL PO), Take 1 tablet by mouth 2 (two) times daily., Disp: , Rfl:    OVER THE COUNTER MEDICATION, Take 1 tablet by mouth daily. Bone up bone supplement, Disp: , Rfl:    oxyCODONE (OXY  IR/ROXICODONE) 5 MG immediate release tablet, Take 1 tablet (5 mg total) by mouth every 6 (six) hours as needed for severe pain., Disp: 15 tablet, Rfl: 0   phenazopyridine (PYRIDIUM) 200 MG tablet, Take 1 tablet (200 mg total) by mouth 3 (three) times daily as needed for pain., Disp: 15 tablet, Rfl: 0   QUERCETIN PO, Take 2 tablets by mouth 3 (three) times daily., Disp: , Rfl:    Testosterone 1.62 % GEL, , Disp: , Rfl:    Zinc Sulfate 66 MG TABS, Take 132 mg by mouth daily., Disp: , Rfl:    butalbital-acetaminophen-caffeine (FIORICET) 50-325-40 MG tablet, Take 1 tablet by mouth every 6 (six) hours as needed for headache., Disp: 14 tablet, Rfl: 0   olmesartan (BENICAR) 20 MG tablet, Take 1 tablet (20 mg total) by mouth daily., Disp: 90 tablet, Rfl: 3  Allergies  Allergen Reactions   Lactase-Lactobacillus Cough   Timothy Grass Pollen Allergen Other (See Comments) and Cough   Wheat Cough   ROS neg/noncontributory except as noted HPI/below      Objective:     BP 118/70   Pulse 90   Temp 97.7 F (36.5 C) (Temporal)   Resp 18   Ht 5\' 4"  (1.626 m)   Wt 207 lb 8 oz (94.1 kg)   SpO2 96%   BMI 35.62 kg/m  Wt Readings from Last 3 Encounters:  04/04/23 207 lb 8 oz (94.1 kg)  04/03/23 210 lb (95.3 kg)  03/07/23 208 lb 3.2 oz (94.4 kg)    Physical Exam   Gen: WDWN NAD HEENT: NCAT, conjunctiva not injected, sclera nonicteric NECK:  supple, no thyromegaly, no nodes, no carotid bruits CARDIAC: RRR, S1S2+, no murmur. DP 2+B LUNGS: CTAB. No wheezes ABDOMEN:  BS+, soft, NTND, No HSM, no masses EXT:  no edema MSK: no gross  abnormalities.  NEURO: A&O x3.  CN II-XII intact.  PSYCH: normal mood. Good eye contact  Back:  can stand on heels/toes/1 leg. Positive left SI tenderness and fullness. No rash.  MS 5/5 BLE.  SLR neg B.  Good ROM, but tenderness with rotation to the left.    Reviewed latest labs-could not find A1C     Assessment & Plan:  Primary hypertension Assessment & Plan: Chronic.  Controlled.  Continue olmesartan 20mg  daily   Mixed hyperlipidemia Assessment & Plan: Chronic.  Was "controlled", but now w/newer dx DM-goal LDL <100.  Will work on diet/exercise, etc and f/u 3 mo   Type 2 diabetes mellitus with hyperglycemia, without long-term current use of insulin (HCC) Assessment & Plan: Newer dx.  Pt thought still PreDM-A1C 6.5 in past and if 6.9 recently(can't find result), then DM.  Discussed goals of therapy, changes in goals for cholesterol, etc.  Understandably, not working on diet d/t new dx breast ca.  Will continue exercise and work on diet more.  Discussed GLP-1.  She will research.  F/u 3 mo for labs, more discussion, etc.    Other headache syndrome Assessment & Plan: Chronic.  Not occurring often.  Last filled fioricen 1 yr ago-checked pdmp.  Will renew   Malignant neoplasm of lower-outer quadrant of right breast of female, estrogen receptor positive (HCC)  Other orders -     Olmesartan Medoxomil; Take 1 tablet (20 mg total) by mouth daily.  Dispense: 90 tablet; Refill: 3 -     Butalbital-APAP-Caffeine; Take 1 tablet by mouth every 6 (six) hours as needed for headache.  Dispense: 14 tablet; Refill: 0  Breast  ca-newer dx-June 2024-had lumpectomy, seeds.  Seeing onc/rad-onc  Return in about 3 months (around 07/05/2023) for DM, HTN, cholesterol-fasting.   I,Rachel Rivera,acting as a scribe for Angelena Sole, MD.,have documented all relevant documentation on the behalf of Angelena Sole, MD,as directed by  Angelena Sole, MD while in the presence of Angelena Sole, MD.  I, Angelena Sole, MD,  have reviewed all documentation for this visit. The documentation on 04/05/23 for the exam, diagnosis, procedures, and orders are all accurate and complete.   Angelena Sole, MD

## 2023-04-04 NOTE — Patient Instructions (Addendum)
Ideally fasting <120, before meal <140, 2 hrs after <160.  Check sugars 2x/wk different times.  GLP-1 class-ozempic, mounjaro

## 2023-04-05 DIAGNOSIS — E1165 Type 2 diabetes mellitus with hyperglycemia: Secondary | ICD-10-CM | POA: Insufficient documentation

## 2023-04-05 NOTE — Assessment & Plan Note (Signed)
Chronic.  Not occurring often.  Last filled fioricen 1 yr ago-checked pdmp.  Will renew

## 2023-04-05 NOTE — Assessment & Plan Note (Signed)
Chronic.  Was "controlled", but now w/newer dx DM-goal LDL <100.  Will work on diet/exercise, etc and f/u 3 mo

## 2023-04-05 NOTE — Assessment & Plan Note (Signed)
Newer dx.  Pt thought still PreDM-A1C 6.5 in past and if 6.9 recently(can't find result), then DM.  Discussed goals of therapy, changes in goals for cholesterol, etc.  Understandably, not working on diet d/t new dx breast ca.  Will continue exercise and work on diet more.  Discussed GLP-1.  She will research.  F/u 3 mo for labs, more discussion, etc.

## 2023-04-05 NOTE — Assessment & Plan Note (Signed)
Chronic.  Controlled.  Continue olmesartan 20mg  daily

## 2023-04-09 ENCOUNTER — Telehealth: Payer: Self-pay | Admitting: Podiatry

## 2023-04-09 ENCOUNTER — Encounter: Payer: Self-pay | Admitting: Podiatry

## 2023-04-09 ENCOUNTER — Ambulatory Visit (INDEPENDENT_AMBULATORY_CARE_PROVIDER_SITE_OTHER): Payer: No Typology Code available for payment source | Admitting: Podiatry

## 2023-04-09 ENCOUNTER — Ambulatory Visit (INDEPENDENT_AMBULATORY_CARE_PROVIDER_SITE_OTHER): Payer: Medicare Other

## 2023-04-09 DIAGNOSIS — N649 Disorder of breast, unspecified: Secondary | ICD-10-CM | POA: Diagnosis not present

## 2023-04-09 DIAGNOSIS — C50911 Malignant neoplasm of unspecified site of right female breast: Secondary | ICD-10-CM | POA: Diagnosis not present

## 2023-04-09 DIAGNOSIS — M722 Plantar fascial fibromatosis: Secondary | ICD-10-CM

## 2023-04-09 MED ORDER — MELOXICAM 15 MG PO TABS: 15.0000 mg | ORAL_TABLET | Freq: Every day | ORAL | 3 refills | Status: DC

## 2023-04-09 MED ORDER — METHYLPREDNISOLONE 4 MG PO TBPK: ORAL_TABLET | ORAL | 0 refills | Status: DC

## 2023-04-09 MED ORDER — TRIAMCINOLONE ACETONIDE 40 MG/ML IJ SUSP
20.0000 mg | Freq: Once | INTRAMUSCULAR | Status: AC
Start: 2023-04-09 — End: 2023-04-09
  Administered 2023-04-09: 20 mg

## 2023-04-09 NOTE — Patient Instructions (Signed)

## 2023-04-09 NOTE — Telephone Encounter (Signed)
Pt called and was given 2 prescriptions  one for the prednisone and was to start that asap. The other was meloxicam and she is not sure when she should start taking that. Should she wait until after taking the prednisone.

## 2023-04-09 NOTE — Progress Notes (Signed)
Subjective:  Patient ID: Monica Rogers, female    DOB: 10-17-1950,  MRN: [id number] HPI Chief Complaint  Patient presents with   Foot Pain    Plantar heel left - aching x 2-3 months, AM pain and worse in the PM after on it all day, history of PF, tried icing and stretching   New Patient (Initial Visit)    72 y.o. female presents with the above complaint.   ROS: Denies fever chills nausea mobic muscle aches pains calf pain back pain chest pain shortness of breath.  Past Medical History:  Diagnosis Date   Allergy    Anemia    Arthritis    Blood transfusion without reported diagnosis    Breast cancer (HCC) 2024   R   Chronic headaches    Diverticulosis    Hyperlipidemia    Hypertension    Hypothyroidism    IBS (irritable bowel syndrome)    Prediabetes    Recurrent UTI    Past Surgical History:  Procedure Laterality Date   BREAST BIOPSY Right 2019   BREAST BIOPSY Right 12/25/2022   [place] RT BREAST BX W LOC DEV 1ST LESION IMG BX SPEC US GUIDE 12/25/2022 [facility]   BREAST BIOPSY Right 01/01/2023   [place] RT BREAST BX W LOC DEV 1ST LESION IMG BX SPEC US GUIDE 01/01/2023 [facility]   BREAST BIOPSY  02/25/2023   MM RT RADIOACTIVE SEED LOC MAMMO GUIDE 02/25/2023 [facility]   BREAST BIOPSY  02/25/2023   MM RT RADIOACTIVE SEED EA ADD LESION LOC MAMMO GUIDE 02/25/2023 [facility]   BREAST LUMPECTOMY WITH RADIOACTIVE SEED LOCALIZATION Right 02/26/2023   Procedure: RIGHT BREAST BRACKETED SEED LUMPECTOMY;  Surgeon: Harriette Bouillon, MD;  Location: [facility];  Service: General;  Laterality: Right;   IR ANGIOGRAM SELECTIVE EACH ADDITIONAL VESSEL  09/28/2020   IR ANGIOGRAM SELECTIVE EACH ADDITIONAL VESSEL  09/28/2020   IR ANGIOGRAM SELECTIVE EACH ADDITIONAL VESSEL  09/28/2020   IR ANGIOGRAM SELECTIVE EACH ADDITIONAL VESSEL  09/28/2020   IR ANGIOGRAM VISCERAL SELECTIVE  09/28/2020   IR ANGIOGRAM VISCERAL SELECTIVE  09/28/2020   IR EMBO ART  VEN HEMORR  LYMPH EXTRAV  INC GUIDE ROADMAPPING  09/28/2020   IR US GUIDE VASC ACCESS RIGHT  09/28/2020   TONSILLECTOMY      Current Outpatient Medications:    meloxicam (MOBIC) 15 MG tablet, Take 1 tablet (15 mg total) by mouth daily., Disp: 30 tablet, Rfl: 3   methylPREDNISolone (MEDROL DOSEPAK) 4 MG TBPK tablet, 6 day dose pack - take as directed, Disp: 21 tablet, Rfl: 0   AMBULATORY NON FORMULARY MEDICATION, Bone Up 1 tablet in the morning and 2 in the afternoon, Disp: , Rfl:    ARMOUR THYROID 120 MG tablet, TAKE 1 TABLET BY MOUTH EVERY DAY, Disp: 90 tablet, Rfl: 4   Ascorbic Acid (VITAMIN C) 100 MG CHEW, , Disp: , Rfl:    B Complex Vitamins (VITAMIN B COMPLEX 100 IJ), , Disp: , Rfl:    Blood Glucose Monitoring Suppl (ONETOUCH VERIO FLEX SYSTEM) DEVI, USE TO TEST BLOOD SUGAR DAILY, Disp: , Rfl:    Blood Glucose Monitoring Suppl (ONETOUCH VERIO) w/Device KIT, Use to test blood sugar daily, Disp: 1 kit, Rfl: 0   butalbital-acetaminophen-caffeine (FIORICET) 50-325-40 MG tablet, Take 1 tablet by mouth every 6 (six) hours as needed for headache., Disp: 14 tablet, Rfl: 0   CALCIUM PO, Take 1 tablet by mouth daily., Disp: , Rfl:    Carboxymethylcellulose Sodium (EYE  DROPS OP), Apply 2 drops to eye 2 (two) times daily as needed (dry eyes)., Disp: , Rfl:    Cholecalciferol (VITAMIN D3) 50 MCG (2000 UT) capsule, , Disp: , Rfl:    CHONDROITIN SULFATE PO, Take 1 tablet by mouth daily., Disp: , Rfl:    Clobetasol Propionate 0.05 % shampoo, Apply topically 2 (two) times a week., Disp: , Rfl:    clotrimazole-betamethasone (LOTRISONE) cream, Apply a small amount to affected area 1-2 times per day for 14 days, then stop and use as needed., Disp: , Rfl:    Coenzyme Q10 (COQ-10) 100 MG capsule, , Disp: , Rfl:    dexamethasone (DECADRON) 0.1 % ophthalmic solution, SMARTSIG:2 Drop(s) In Ear(s), Disp: , Rfl:    Digestive Enzymes (ENZYME DIGEST) CAPS, Take 2 capsules by mouth daily., Disp: , Rfl:    estradiol (ESTRACE) 0.1  MG/GM vaginal cream, Place 1 Applicatorful vaginally 2 (two) times a week., Disp: , Rfl:    glucose blood (ONETOUCH VERIO) test strip, Use as instructed, Disp: 100 each, Rfl: 2   glucose blood test strip, See admin instructions., Disp: , Rfl:    Lancets (ONETOUCH DELICA PLUS LANCET30G) MISC, See admin instructions., Disp: , Rfl:    Lancets (ONETOUCH ULTRASOFT) lancets, Use as instructed, Disp: 100 each, Rfl: 2   Magnesium 110 MG CAPS, Take 2 capsules by mouth 2 (two) times daily., Disp: , Rfl:    olmesartan (BENICAR) 20 MG tablet, Take 1 tablet (20 mg total) by mouth daily., Disp: 90 tablet, Rfl: 3   Omega-3 Fatty Acids (FISH OIL PO), Take 1 tablet by mouth 2 (two) times daily., Disp: , Rfl:    OVER THE COUNTER MEDICATION, Take 1 tablet by mouth daily. Bone up bone supplement, Disp: , Rfl:    oxyCODONE (OXY IR/ROXICODONE) 5 MG immediate release tablet, Take 1 tablet (5 mg total) by mouth every 6 (six) hours as needed for severe pain., Disp: 15 tablet, Rfl: 0   phenazopyridine (PYRIDIUM) 200 MG tablet, Take 1 tablet (200 mg total) by mouth 3 (three) times daily as needed for pain., Disp: 15 tablet, Rfl: 0   QUERCETIN PO, Take 2 tablets by mouth 3 (three) times daily., Disp: , Rfl:    Testosterone 1.62 % GEL, , Disp: , Rfl:    Zinc Sulfate 66 MG TABS, Take 132 mg by mouth daily., Disp: , Rfl:   Current Facility-Administered Medications:    triamcinolone acetonide (KENALOG-40) injection 20 mg, 20 mg, Other, Once,   Allergies  Allergen Reactions   Lactase-Lactobacillus Cough   Timothy Grass Pollen Allergen Other (See Comments) and Cough   Wheat Cough   Review of Systems Objective:  There were no vitals filed for this visit.  General: Well developed, nourished, in no acute distress, alert and oriented x3   Dermatological: Skin is warm, dry and supple bilateral. Nails x 10 are well maintained; remaining integument appears unremarkable at this time. There are no open sores, no preulcerative  lesions, no rash or signs of infection present.  Vascular: Dorsalis Pedis artery and Posterior Tibial artery pedal pulses are 2/4 bilateral with immedate capillary fill time. Pedal hair growth present. No varicosities and no lower extremity edema present bilateral.   Neruologic: Grossly intact via light touch bilateral. Vibratory intact via tuning fork bilateral. Protective threshold with Semmes Wienstein monofilament intact to all pedal sites bilateral. Patellar and Achilles deep tendon reflexes 2+ bilateral. No Babinski or clonus noted bilateral.   Musculoskeletal: No gross boney pedal deformities bilateral. No  pain, crepitus, or limitation noted with foot and ankle range of motion bilateral. Muscular strength 5/5 in all groups tested bilateral.  She has pain on palpation medial calcaneal tubercle of the left heel.  Gait: Unassisted, Nonantalgic.    Radiographs:  Radiographs taken today demonstrate osseously mature individual with decent bone mineralization.  She has plantar distal aortic and heel spur with soft tissue increase in density at the plantar fascial calcaneal insertion site.  She has some osteoarthritic change tarsometatarsal joints and some posterior calcaneal spurring as well.  No thickening of the Achilles tendon.    Assessment & Plan:   Assessment: Planter fasciitis left  Plan: Discussed etiology pathology conservative surgical therapies at this point I injected the left heel today 20 mg Kenalog 5 mg Marcaine point maximal tenderness.  Started her on methylprednisolone to be followed by meloxicam.  Discussed appropriate shoe gear and I will perform a     Hether Anselmo T. [place], [place]

## 2023-04-10 ENCOUNTER — Encounter: Payer: Self-pay | Admitting: *Deleted

## 2023-04-10 NOTE — Telephone Encounter (Signed)
Notified pt to complete the prednisone first and then start the meloxicam. She asked if the prednisone can make her not sleep and I told her I believe that is a side effect and she said she should take in the morning.  She then called back asking if the meloxicam was a pain medication and I told her it was an anti inflammatory and she said ok and thank you.

## 2023-04-16 ENCOUNTER — Telehealth: Payer: Self-pay | Admitting: Radiation Oncology

## 2023-04-16 ENCOUNTER — Encounter: Payer: Self-pay | Admitting: *Deleted

## 2023-04-16 DIAGNOSIS — Z170 Estrogen receptor positive status [ER+]: Secondary | ICD-10-CM

## 2023-04-16 NOTE — Telephone Encounter (Signed)
I called and spoke with the patient and she's decided to forgo radiation treatment. She will follow up with Dr. Pamelia Hoit.

## 2023-04-19 ENCOUNTER — Telehealth: Payer: Self-pay | Admitting: Hematology and Oncology

## 2023-04-19 NOTE — Telephone Encounter (Signed)
Patient has stated they did not want to move forward with anit-estogen plan; message has been sent over to breast navigators

## 2023-04-23 DIAGNOSIS — E669 Obesity, unspecified: Secondary | ICD-10-CM | POA: Diagnosis not present

## 2023-04-23 DIAGNOSIS — I700 Atherosclerosis of aorta: Secondary | ICD-10-CM | POA: Diagnosis not present

## 2023-04-23 DIAGNOSIS — R2681 Unsteadiness on feet: Secondary | ICD-10-CM | POA: Diagnosis not present

## 2023-04-23 DIAGNOSIS — Z008 Encounter for other general examination: Secondary | ICD-10-CM | POA: Diagnosis not present

## 2023-04-23 DIAGNOSIS — E785 Hyperlipidemia, unspecified: Secondary | ICD-10-CM | POA: Diagnosis not present

## 2023-04-23 DIAGNOSIS — Z6833 Body mass index (BMI) 33.0-33.9, adult: Secondary | ICD-10-CM | POA: Diagnosis not present

## 2023-04-23 DIAGNOSIS — I10 Essential (primary) hypertension: Secondary | ICD-10-CM | POA: Diagnosis not present

## 2023-04-23 DIAGNOSIS — C50919 Malignant neoplasm of unspecified site of unspecified female breast: Secondary | ICD-10-CM | POA: Diagnosis not present

## 2023-04-25 ENCOUNTER — Telehealth: Payer: Self-pay | Admitting: Nurse Practitioner

## 2023-04-25 DIAGNOSIS — C50911 Malignant neoplasm of unspecified site of right female breast: Secondary | ICD-10-CM | POA: Diagnosis not present

## 2023-04-25 DIAGNOSIS — N649 Disorder of breast, unspecified: Secondary | ICD-10-CM | POA: Diagnosis not present

## 2023-04-25 NOTE — Telephone Encounter (Signed)
Called patient in reference to complaints of ABD pain, patient states the pain is at a pain level of 4 or 5 and states it is a lower abdominal cramping for the last 2 days. Patient denies blood in stool and fever. Patient also states her stools are normal and she is not presently on any ABX. Patient does have a hx of diverticulitis, and she believes she has an infection. Patient scheduled for a f/u appt, not able to be seen prior to 07/15/23. Patient was last seen on 12/25/21 with Willette Cluster, NP. Patient wants to be seen sooner, there are no sooner available appts at this time. Suggested to the patient to take Peppermint tea, IBgard, Pepto Bismol or Maalox for abdominal pain with a complaint of 4 or 5 level of discomfort. Patient initially agreed to take either suggestion, but still wants to be seen sooner. Nurse then suggested the patient go to the Urgent Care or ED if need be. Patient refused to go to the Urgent Care and ED. Informed the patient nurse would contact her as soon as the NP replies.

## 2023-04-25 NOTE — Telephone Encounter (Signed)
Inbound call from patient requesting to speak with nurse regarding issues with abdominal pain. Please advise.

## 2023-04-29 ENCOUNTER — Encounter: Payer: Self-pay | Admitting: *Deleted

## 2023-04-30 ENCOUNTER — Ambulatory Visit (INDEPENDENT_AMBULATORY_CARE_PROVIDER_SITE_OTHER): Payer: No Typology Code available for payment source | Admitting: Physician Assistant

## 2023-04-30 ENCOUNTER — Encounter: Payer: Self-pay | Admitting: Physician Assistant

## 2023-04-30 VITALS — BP 130/70 | HR 86 | Temp 97.5°F | Ht 64.0 in | Wt 208.0 lb

## 2023-04-30 DIAGNOSIS — R300 Dysuria: Secondary | ICD-10-CM | POA: Diagnosis not present

## 2023-04-30 LAB — POCT URINALYSIS DIPSTICK
Bilirubin, UA: NEGATIVE
Blood, UA: NEGATIVE
Glucose, UA: NEGATIVE
Ketones, UA: NEGATIVE
Nitrite, UA: NEGATIVE
Protein, UA: NEGATIVE
Spec Grav, UA: 1.015 (ref 1.010–1.025)
Urobilinogen, UA: 0.2 E.U./dL
pH, UA: 6.5 (ref 5.0–8.0)

## 2023-04-30 MED ORDER — CEPHALEXIN 500 MG PO CAPS: 500.0000 mg | ORAL_CAPSULE | Freq: Two times a day (BID) | ORAL | 0 refills | Status: DC

## 2023-04-30 NOTE — Progress Notes (Signed)
Monica Rogers is a 72 y.o. female here for a new problem.  History of Present Illness:   Chief Complaint  Patient presents with   Urinary Frequency    Pt c/o urinary frequency, cramping, some low back pain, started 2-3 weeks ago, was treated for UTI months ago. Denies fever or chills.    HPI  Urinary Frequency: Complains of urinary frequency, cramping, and lower back pain that began 2-3 weeks ago.  Endorses metallic malodor. Reports that she was treated for an UTI months ago.  She had some Ciprofloxacin leftover and treated herself. She is unsure if treatment completely resolved.  Notes that her UTI symptoms usually present severe, but these are mild.  States that she has had UTIs develop into a kidney infection before.  Reports that she uses Estradiol cream and pelvic exercises to prevent recurrent UTIs.  Denies fever, chills, constipation, or diarrhea.   Past Medical History:  Diagnosis Date   Allergy    Anemia    Arthritis    Blood transfusion without reported diagnosis    Breast cancer (HCC) 2024   R   Chronic headaches    Diverticulosis    Hyperlipidemia    Hypertension    Hypothyroidism    IBS (irritable bowel syndrome)    Prediabetes    Recurrent UTI      Social History   Tobacco Use   Smoking status: Never   Smokeless tobacco: Never  Vaping Use   Vaping status: Never Used  Substance Use Topics   Alcohol use: No   Drug use: No    Past Surgical History:  Procedure Laterality Date   BREAST BIOPSY Right 2019   BREAST BIOPSY Right 12/25/2022   [place] RT BREAST BX W LOC DEV 1ST LESION IMG BX SPEC US GUIDE 12/25/2022 [facility]   BREAST BIOPSY Right 01/01/2023   [place] RT BREAST BX W LOC DEV 1ST LESION IMG BX SPEC US GUIDE 01/01/2023 [facility]   BREAST BIOPSY  02/25/2023   MM RT RADIOACTIVE SEED LOC MAMMO GUIDE 02/25/2023 [facility]   BREAST BIOPSY  02/25/2023   MM RT RADIOACTIVE SEED EA ADD LESION LOC MAMMO GUIDE 02/25/2023 [facility]    BREAST LUMPECTOMY WITH RADIOACTIVE SEED LOCALIZATION Right 02/26/2023   Procedure: RIGHT BREAST BRACKETED SEED LUMPECTOMY;  Surgeon: Harriette Bouillon, MD;  Location: [facility];  Service: General;  Laterality: Right;   IR ANGIOGRAM SELECTIVE EACH ADDITIONAL VESSEL  09/28/2020   IR ANGIOGRAM SELECTIVE EACH ADDITIONAL VESSEL  09/28/2020   IR ANGIOGRAM SELECTIVE EACH ADDITIONAL VESSEL  09/28/2020   IR ANGIOGRAM SELECTIVE EACH ADDITIONAL VESSEL  09/28/2020   IR ANGIOGRAM VISCERAL SELECTIVE  09/28/2020   IR ANGIOGRAM VISCERAL SELECTIVE  09/28/2020   IR EMBO ART  VEN HEMORR LYMPH EXTRAV  INC GUIDE ROADMAPPING  09/28/2020   IR US GUIDE VASC ACCESS RIGHT  09/28/2020   TONSILLECTOMY      Family History  Problem Relation Age of Onset   Hearing loss Mother    Miscarriages / [place] Mother    Alcohol abuse Father    Hearing loss Father    Hypertension Father    Diabetes Brother    Cancer Maternal Grandmother        possibly had breast cancer dx. in her 30s   Diabetes Paternal Grandmother     Allergies  Allergen Reactions   Lactase-Lactobacillus Cough   Timothy Grass Pollen Allergen Other (See Comments) and Cough   Wheat Cough  Current Medications:   Current Outpatient Medications:    AMBULATORY NON FORMULARY MEDICATION, Bone Up 1 tablet in the morning and 1 in the afternoon, Disp: , Rfl:    ARMOUR THYROID 120 MG tablet, TAKE 1 TABLET BY MOUTH EVERY DAY, Disp: 90 tablet, Rfl: 4   Ascorbic Acid (VITAMIN C) 100 MG CHEW, , Disp: , Rfl:    B Complex Vitamins (VITAMIN B COMPLEX 100 IJ), , Disp: , Rfl:    Blood Glucose Monitoring Suppl (ONETOUCH VERIO FLEX SYSTEM) DEVI, USE TO TEST BLOOD SUGAR DAILY, Disp: , Rfl:    Blood Glucose Monitoring Suppl (ONETOUCH VERIO) w/Device KIT, Use to test blood sugar daily, Disp: 1 kit, Rfl: 0   butalbital-acetaminophen-caffeine (FIORICET) 50-325-40 MG tablet, Take 1 tablet by mouth every 6 (six) hours as needed for headache., Disp: 14  tablet, Rfl: 0   CALCIUM PO, Take 1 tablet by mouth daily., Disp: , Rfl:    Carboxymethylcellulose Sodium (EYE DROPS OP), Apply 2 drops to eye 2 (two) times daily as needed (dry eyes)., Disp: , Rfl:    cephALEXin (KEFLEX) 500 MG capsule, Take 1 capsule (500 mg total) by mouth 2 (two) times daily., Disp: 14 capsule, Rfl: 0   Cholecalciferol (VITAMIN D3) 50 MCG (2000 UT) capsule, , Disp: , Rfl:    CHONDROITIN SULFATE PO, Take 1 tablet by mouth daily., Disp: , Rfl:    Clobetasol Propionate 0.05 % shampoo, Apply topically 2 (two) times a week., Disp: , Rfl:    clotrimazole-betamethasone (LOTRISONE) cream, Apply a small amount to affected area 1-2 times per day for 14 days, then stop and use as needed., Disp: , Rfl:    Coenzyme Q10 (COQ-10) 100 MG capsule, , Disp: , Rfl:    dexamethasone (DECADRON) 0.1 % ophthalmic solution, SMARTSIG:2 Drop(s) In Ear(s), Disp: , Rfl:    Digestive Enzymes (ENZYME DIGEST) CAPS, Take 2 capsules by mouth daily., Disp: , Rfl:    estradiol (ESTRACE) 0.1 MG/GM vaginal cream, Place 1 Applicatorful vaginally 2 (two) times a week., Disp: , Rfl:    glucose blood (ONETOUCH VERIO) test strip, Use as instructed, Disp: 100 each, Rfl: 2   glucose blood test strip, See admin instructions., Disp: , Rfl:    Lancets (ONETOUCH DELICA PLUS LANCET30G) MISC, See admin instructions., Disp: , Rfl:    Lancets (ONETOUCH ULTRASOFT) lancets, Use as instructed, Disp: 100 each, Rfl: 2   Magnesium 110 MG CAPS, Take 2 capsules by mouth 2 (two) times daily., Disp: , Rfl:    olmesartan (BENICAR) 20 MG tablet, Take 1 tablet (20 mg total) by mouth daily., Disp: 90 tablet, Rfl: 3   Omega-3 Fatty Acids (FISH OIL PO), Take 1 tablet by mouth 2 (two) times daily., Disp: , Rfl:    OVER THE COUNTER MEDICATION, Take 1 tablet by mouth daily. Bone up bone supplement, Disp: , Rfl:    QUERCETIN PO, Take 2 tablets by mouth 3 (three) times daily., Disp: , Rfl:    Testosterone 1.62 % GEL, , Disp: , Rfl:    Zinc  Sulfate 66 MG TABS, Take 132 mg by mouth daily., Disp: , Rfl:    Review of Systems:   ROS See pertinent positives and negatives as per the HPI.  Vitals:   Vitals:   04/30/23 1134  BP: 130/70  Pulse: 86  Temp: (!) 97.5 F (36.4 C)  TempSrc: Temporal  SpO2: 98%  Weight: 208 lb (94.3 kg)  Height: 5\' 4"  (1.626 m)     Body mass  index is 35.7 kg/m.  Physical Exam:   Physical Exam Vitals and nursing note reviewed.  Constitutional:      General: She is not in acute distress.    Appearance: She is well-developed. She is not ill-appearing or toxic-appearing.  Cardiovascular:     Rate and Rhythm: Normal rate and regular rhythm.     Pulses: Normal pulses.     Heart sounds: Normal heart sounds, S1 normal and S2 normal.  Pulmonary:     Effort: Pulmonary effort is normal.     Breath sounds: Normal breath sounds.  Abdominal:     Tenderness: There is no right CVA tenderness or left CVA tenderness.  Skin:    General: Skin is warm and dry.  Neurological:     Mental Status: She is alert.     GCS: GCS eye subscore is 4. GCS verbal subscore is 5. GCS motor subscore is 6.  Psychiatric:        Speech: Speech normal.        Behavior: Behavior normal. Behavior is cooperative.    Results for orders placed or performed in visit on 04/30/23  POCT urinalysis dipstick  Result Value Ref Range   Color, UA yellow    Clarity, UA cloudy    Glucose, UA Negative Negative   Bilirubin, UA Negative    Ketones, UA Negative    Spec Grav, UA 1.015 1.010 - 1.025   Blood, UA Negative    pH, UA 6.5 5.0 - 8.0   Protein, UA Negative Negative   Urobilinogen, UA 0.2 0.2 or 1.0 E.U./dL   Nitrite, UA Negative    Leukocytes, UA Small (1+) (A) Negative   Appearance     Odor      Assessment and Plan:   Dysuria No red flags on exam No evidence of systemic illness Given urinalysis with leukocytes and current symptom(s), will empirically start keflex 500 mg twice daily Urine culture pending Follow-up  if new/worsening sx  I,Emily Lagle,acting as a scribe for [employer], PA.,have documented all relevant documentation on the behalf of Jarold Motto, PA,as directed by  Jarold Motto, PA while in the presence of Jarold Motto, [place].   I, Jarold Motto, [place], have reviewed all documentation for this visit. The documentation on 04/30/23 for the exam, diagnosis, procedures, and orders are all accurate and complete.  Jarold Motto, PA-C

## 2023-05-02 LAB — URINE CULTURE
MICRO NUMBER:: 15509989
SPECIMEN QUALITY:: ADEQUATE

## 2023-05-07 ENCOUNTER — Encounter: Payer: Self-pay | Admitting: *Deleted

## 2023-05-08 ENCOUNTER — Telehealth: Payer: Self-pay | Admitting: Podiatry

## 2023-05-08 ENCOUNTER — Telehealth: Payer: Self-pay | Admitting: Family Medicine

## 2023-05-08 NOTE — Telephone Encounter (Signed)
Pt left message yesterday at 757pm stating she had and appt tomorrow 10/2 and was told if feeling better ok to cxl.Marland Kitchen Appt was actually 10/3 and I left message for pt to call to confirm appt is to be cxled as it was for 10/3

## 2023-05-08 NOTE — Telephone Encounter (Signed)
Patient stated she is having cramping in vaginal area and pain with urination. She stated that she completed the antibiotic 2 days ago. She has been taking ibuprofen to help with the cramping/pain.

## 2023-05-08 NOTE — Telephone Encounter (Signed)
Pt states she took all her antibiotic meds for her UTI but is still having symptoms. Please advise.

## 2023-05-09 ENCOUNTER — Other Ambulatory Visit: Payer: Self-pay | Admitting: *Deleted

## 2023-05-09 ENCOUNTER — Ambulatory Visit: Payer: Medicare Other | Admitting: Podiatry

## 2023-05-09 MED ORDER — SULFAMETHOXAZOLE-TRIMETHOPRIM 800-160 MG PO TABS: 1.0000 | ORAL_TABLET | Freq: Two times a day (BID) | ORAL | 0 refills | Status: AC

## 2023-05-09 NOTE — Telephone Encounter (Signed)
Patient stated definitely not a yeast infection, she is having pain, no discharge. Patient stated she was on Keflex, didn't seem to work or maybe she wasn't on it long enough. She is willing to try different antibiotic, would like to know which one. Please advise.

## 2023-05-09 NOTE — Telephone Encounter (Signed)
Rx sent to the pharmacy. Patient notified. 

## 2023-05-10 ENCOUNTER — Telehealth: Payer: Self-pay | Admitting: Adult Health

## 2023-05-10 NOTE — Telephone Encounter (Signed)
Per IB Message on 05/07/23; I called the patient and scheduled a SCP appointment. Patient is aware of the date and time of appointment.

## 2023-05-17 ENCOUNTER — Ambulatory Visit: Payer: No Typology Code available for payment source | Admitting: Hematology and Oncology

## 2023-05-20 ENCOUNTER — Ambulatory Visit (INDEPENDENT_AMBULATORY_CARE_PROVIDER_SITE_OTHER): Payer: No Typology Code available for payment source

## 2023-05-20 VITALS — Wt 208.0 lb

## 2023-05-20 DIAGNOSIS — Z0000 Encounter for general adult medical examination without abnormal findings: Secondary | ICD-10-CM | POA: Diagnosis not present

## 2023-05-20 NOTE — Patient Instructions (Signed)
Ms. Monica Rogers , Thank you for taking time to come for your Medicare Wellness Visit. I appreciate your ongoing commitment to your health goals. Please review the following plan we discussed and let me know if I can assist you in the future.   Referrals/Orders/Follow-Ups/Clinician Recommendations: Aim for 30 minutes of exercise or brisk walking, 6-8 glasses of water, and 5 servings of fruits and vegetables each day.   This is a list of the screening recommended for you and due dates:  Health Maintenance  Topic Date Due   Complete foot exam   Never done   Eye exam for diabetics  Never done   Yearly kidney health urinalysis for diabetes  Never done   DTaP/Tdap/Td vaccine (1 - Tdap) Never done   Zoster (Shingles) Vaccine (1 of 2) 08/01/2023*   Pneumonia Vaccine (1 of 1 - PCV) 08/28/2023*   Flu Shot  11/04/2023*   COVID-19 Vaccine (2 - Pfizer risk series) 04/29/2024*   Hemoglobin A1C  05/31/2023   Colon Cancer Screening  12/13/2023   Yearly kidney function blood test for diabetes  01/09/2024   Medicare Annual Wellness Visit  05/19/2024   Mammogram  12/13/2024   DEXA scan (bone density measurement)  Completed   Hepatitis C Screening  Completed   HPV Vaccine  Aged Out  *Topic was postponed. The date shown is not the original due date.    Advanced directives: (Copy Requested) Please bring a copy of your health care power of attorney and living will to the office to be added to your chart at your convenience.  Next Medicare Annual Wellness Visit scheduled for next year: Yes

## 2023-05-20 NOTE — Progress Notes (Signed)
Subjective:   Monica Rogers is a 72 y.o. female who presents for Medicare Annual (Subsequent) preventive examination.  Visit Complete: Virtual I connected with  Monica Rogers on 05/20/23 by a audio enabled telemedicine application and verified that I am speaking with the correct person using two identifiers.  Patient Location: Home  Provider Location: Office/Clinic  I discussed the limitations of evaluation and management by telemedicine. The patient expressed understanding and agreed to proceed.  Vital Signs: Because this visit was a virtual/telehealth visit, some criteria may be missing or patient reported. Any vitals not documented were not able to be obtained and vitals that have been documented are patient reported.  Because this visit was a virtual/telehealth visit, some criteria may be missing or patient reported. Any vitals not documented were not able to be obtained and vitals that have been documented are patient reported.    Cardiac Risk Factors include: advanced age (>41men, >54 women);diabetes mellitus;dyslipidemia;hypertension;obesity (BMI >30kg/m2)     Objective:    Today's Vitals   05/20/23 1504  Weight: 208 lb (94.3 kg)   Body mass index is 35.7 kg/m.     05/20/2023    3:14 PM 04/03/2023   10:39 AM 03/07/2023   11:05 AM 02/26/2023    9:26 AM 02/14/2023    2:08 PM 01/09/2023    5:35 PM 04/26/2022   10:40 AM  Advanced Directives  Does Patient Have a Medical Advance Directive? Yes Yes Yes Yes Yes Yes Yes  Type of Estate agent of [place];Living will Living will;Healthcare Power of [employer] Power of [place];Living will Healthcare Power of [place];Living will Living will;Healthcare Power of [employer] Power of [place];Living will Healthcare Power of [place];Living will  Does patient want to make changes to medical advance directive?   No - Patient declined No - Patient declined No - Patient declined No - Patient declined    Copy of Healthcare Power of Attorney in Chart? No - copy requested  No - copy requested  No - copy requested No - copy requested No - copy requested  Would patient like information on creating a medical advance directive?   No - Patient declined No - Patient declined No - Patient declined      Current Medications (verified) Outpatient Encounter Medications as of 05/20/2023  Medication Sig   AMBULATORY NON FORMULARY MEDICATION Bone Up 1 tablet in the morning and 1 in the afternoon   ARMOUR THYROID 120 MG tablet TAKE 1 TABLET BY MOUTH EVERY DAY   Ascorbic Acid (VITAMIN C) 100 MG CHEW    B Complex Vitamins (VITAMIN B COMPLEX 100 IJ)    Blood Glucose Monitoring Suppl (ONETOUCH VERIO FLEX SYSTEM) DEVI USE TO TEST BLOOD SUGAR DAILY   Blood Glucose Monitoring Suppl (ONETOUCH VERIO) w/Device KIT Use to test blood sugar daily   butalbital-acetaminophen-caffeine (FIORICET) 50-325-40 MG tablet Take 1 tablet by mouth every 6 (six) hours as needed for headache.   CALCIUM PO Take 1 tablet by mouth daily.   Carboxymethylcellulose Sodium (EYE DROPS OP) Apply 2 drops to eye 2 (two) times daily as needed (dry eyes).   Cholecalciferol (VITAMIN D3) 50 MCG (2000 UT) capsule    clotrimazole-betamethasone (LOTRISONE) cream Apply a small amount to affected area 1-2 times per day for 14 days, then stop and use as needed.   Coenzyme Q10 (COQ-10) 100 MG capsule    dexamethasone (DECADRON) 0.1 % ophthalmic solution SMARTSIG:2 Drop(s) In Ear(s)   Digestive Enzymes (ENZYME DIGEST) CAPS Take  2 capsules by mouth daily.   glucose blood (ONETOUCH VERIO) test strip Use as instructed   glucose blood test strip See admin instructions.   Lancets (ONETOUCH DELICA PLUS LANCET30G) MISC See admin instructions.   Lancets (ONETOUCH ULTRASOFT) lancets Use as instructed   Magnesium 110 MG CAPS Take 2 capsules by mouth 2 (two) times daily.   olmesartan (BENICAR) 20 MG tablet Take 1 tablet (20 mg total) by mouth daily.   Omega-3  Fatty Acids (FISH OIL PO) Take 1 tablet by mouth 2 (two) times daily.   QUERCETIN PO Take 2 tablets by mouth 3 (three) times daily.   Testosterone 1.62 % GEL    Zinc Sulfate 66 MG TABS Take 132 mg by mouth daily.   [DISCONTINUED] cephALEXin (KEFLEX) 500 MG capsule Take 1 capsule (500 mg total) by mouth 2 (two) times daily. (Patient not taking: Reported on 05/20/2023)   [DISCONTINUED] CHONDROITIN SULFATE PO Take 1 tablet by mouth daily.   [DISCONTINUED] Clobetasol Propionate 0.05 % shampoo Apply topically 2 (two) times a week.   [DISCONTINUED] estradiol (ESTRACE) 0.1 MG/GM vaginal cream Place 1 Applicatorful vaginally 2 (two) times a week.   [DISCONTINUED] OVER THE COUNTER MEDICATION Take 1 tablet by mouth daily. Bone up bone supplement   No facility-administered encounter medications on file as of 05/20/2023.    Allergies (verified) Lactase-lactobacillus, Timothy grass pollen allergen, and Wheat   History: Past Medical History:  Diagnosis Date   Allergy    Anemia    Arthritis    Blood transfusion without reported diagnosis    Breast cancer (HCC) 2024   R   Chronic headaches    Diverticulosis    Hyperlipidemia    Hypertension    Hypothyroidism    IBS (irritable bowel syndrome)    Prediabetes    Recurrent UTI    Past Surgical History:  Procedure Laterality Date   BREAST BIOPSY Right 2019   BREAST BIOPSY Right 12/25/2022   [place] RT BREAST BX W LOC DEV 1ST LESION IMG BX SPEC US GUIDE 12/25/2022 [facility]   BREAST BIOPSY Right 01/01/2023   [place] RT BREAST BX W LOC DEV 1ST LESION IMG BX SPEC US GUIDE 01/01/2023 [facility]   BREAST BIOPSY  02/25/2023   MM RT RADIOACTIVE SEED LOC MAMMO GUIDE 02/25/2023 [facility]   BREAST BIOPSY  02/25/2023   MM RT RADIOACTIVE SEED EA ADD LESION LOC MAMMO GUIDE 02/25/2023 [facility]   BREAST LUMPECTOMY WITH RADIOACTIVE SEED LOCALIZATION Right 02/26/2023   Procedure: RIGHT BREAST BRACKETED SEED LUMPECTOMY;  Surgeon: Harriette Bouillon, MD;  Location: [facility];  Service: General;  Laterality: Right;   IR ANGIOGRAM SELECTIVE EACH ADDITIONAL VESSEL  09/28/2020   IR ANGIOGRAM SELECTIVE EACH ADDITIONAL VESSEL  09/28/2020   IR ANGIOGRAM SELECTIVE EACH ADDITIONAL VESSEL  09/28/2020   IR ANGIOGRAM SELECTIVE EACH ADDITIONAL VESSEL  09/28/2020   IR ANGIOGRAM VISCERAL SELECTIVE  09/28/2020   IR ANGIOGRAM VISCERAL SELECTIVE  09/28/2020   IR EMBO ART  VEN HEMORR LYMPH EXTRAV  INC GUIDE ROADMAPPING  09/28/2020   IR US GUIDE VASC ACCESS RIGHT  09/28/2020   TONSILLECTOMY     Family History  Problem Relation Age of Onset   Hearing loss Mother    Miscarriages / [place] Mother    Alcohol abuse Father    Hearing loss Father    Hypertension Father    Diabetes Brother    Cancer Maternal Grandmother        possibly had breast  cancer dx. in her 30s   Diabetes Paternal Grandmother    Social History   Socioeconomic History   Marital status: Married    Spouse name: Not on file   Number of children: 3   Years of education: Not on file   Highest education level: Master's degree (e.g., MA, MS, MEng, MEd, MSW, MBA)  Occupational History   Occupation: Artist  Tobacco Use   Smoking status: Never   Smokeless tobacco: Never  Vaping Use   Vaping status: Never Used  Substance and Sexual Activity   Alcohol use: No   Drug use: No   Sexual activity: Not Currently    Birth control/protection: None  Other Topics Concern   Not on file  Social History Narrative   1 grand      Retired Runner, broadcasting/film/video.   Artist now   Social Determinants of Health   Financial Resource Strain: Low Risk  (05/20/2023)   Overall Financial Resource Strain (CARDIA)    Difficulty of Paying Living Expenses: Not hard at all  Recent Concern: Financial Resource Strain - Medium Risk (03/31/2023)   Overall Financial Resource Strain (CARDIA)    Difficulty of Paying Living Expenses: Somewhat hard  Food Insecurity: No Food Insecurity (05/20/2023)    Hunger Vital Sign    Worried About Running Out of Food in the Last Year: Never true    Ran Out of Food in the Last Year: Never true  Recent Concern: Food Insecurity - Food Insecurity Present (03/31/2023)   Hunger Vital Sign    Worried About Running Out of Food in the Last Year: Never true    Ran Out of Food in the Last Year: Often true  Transportation Needs: No Transportation Needs (05/20/2023)   PRAPARE - Administrator, Civil Service (Medical): No    Lack of Transportation (Non-Medical): No  Physical Activity: Insufficiently Active (05/20/2023)   Exercise Vital Sign    Days of Exercise per Week: 5 days    Minutes of Exercise per Session: 20 min  Stress: No Stress Concern Present (05/20/2023)   [employer] of Occupational Health - Occupational Stress Questionnaire    Feeling of Stress : Not at all  Social Connections: Socially Integrated (05/20/2023)   Social Connection and Isolation Panel [NHANES]    Frequency of Communication with Friends and Family: More than three times a week    Frequency of Social Gatherings with Friends and Family: More than three times a week    Attends Religious Services: More than 4 times per year    Active Member of [employer] or Organizations: Yes    Attends Banker Meetings: 1 to 4 times per year    Marital Status: Married    Tobacco Counseling Counseling given: Not Answered   Clinical Intake:  Pre-visit preparation completed: Yes  Pain : No/denies pain     BMI - recorded: 35.7 Nutritional Status: BMI > 30  Obese Nutritional Risks: None Diabetes: Yes CBG done?: No Did pt. bring in CBG monitor from home?: No  How often do you need to have someone help you when you read instructions, pamphlets, or other written materials from your doctor or pharmacy?: 1 - Never  Interpreter Needed?: No  Information entered by :: Lanier Ensign, LPN   Activities of Daily Living    05/20/2023    3:07 PM  In your present state  of health, do you have any difficulty performing the following activities:  Hearing? 0  Vision? 0  Difficulty  concentrating or making decisions? 0  Walking or climbing stairs? 0  Dressing or bathing? 0  Doing errands, shopping? 0  Preparing Food and eating ? N  Using the Toilet? N  In the past six months, have you accidently leaked urine? Y  Comment at times  Do you have problems with loss of bowel control? N  Managing your Medications? N  Managing your Finances? N  Housekeeping or managing your Housekeeping? N    Patient Care Team: Jeani Sow, MD as PCP - General (Family Medicine) Serena Croissant, MD as Consulting Physician (Hematology and Oncology) Harriette Bouillon, MD as Consulting Physician (General Surgery) Dorothy Puffer, MD as Consulting Physician (Radiation Oncology) Pershing Proud, RN as Oncology Nurse Navigator Donnelly Angelica, RN as Oncology Nurse Navigator  Indicate any recent Medical Services you may have received from other than [facility] providers in the past year (date may be approximate).     Assessment:   This is a routine wellness examination for Adley.  Hearing/Vision screen Hearing Screening - Comments:: Pt denies any hearing issues  Vision Screening - Comments:: Pt follows up with [facility]    Goals Addressed             This Visit's Progress    Patient Stated       Lose weight        Depression Screen    05/20/2023    3:11 PM 04/04/2023   11:42 AM 01/15/2023    8:41 AM 04/26/2022   10:38 AM 02/22/2022   11:20 AM  PHQ 2/9 Scores  PHQ - 2 Score 0 0 0 0 0  PHQ- 9 Score 0 3       Fall Risk    05/20/2023    3:13 PM 04/04/2023   11:41 AM 08/27/2022   10:59 AM 04/26/2022   10:41 AM 02/22/2022   11:02 AM  Fall Risk   Falls in the past year? 0 1 0 0 0  Number falls in past yr: 0 0 0 0 0  Injury with Fall? 0 1 0 0 0  Risk for fall due to : Impaired vision Impaired balance/gait No Fall Risks No Fall Risks;Impaired vision;Impaired  balance/gait No Fall Risks  Follow up Falls prevention discussed Falls prevention discussed Falls evaluation completed Falls prevention discussed     MEDICARE RISK AT HOME: Medicare Risk at Home Any stairs in or around the home?: Yes If so, are there any without handrails?: No Home free of loose throw rugs in walkways, pet beds, electrical cords, etc?: Yes Adequate lighting in your home to reduce risk of falls?: Yes Life alert?: No Use of a cane, walker or w/c?: No Grab bars in the bathroom?: No Shower chair or bench in shower?: No Elevated toilet seat or a handicapped toilet?: No  TIMED UP AND GO:  Was the test performed?  No    Cognitive Function:        05/20/2023    3:14 PM 04/26/2022   10:43 AM  6CIT Screen  What Year? 0 points 0 points  What month? 0 points 0 points  What time? 0 points 0 points  Count back from 20 0 points 0 points  Months in reverse 0 points 0 points  Repeat phrase 0 points 2 points  Total Score 0 points 2 points    Immunizations Immunization History  Administered Date(s) Administered   PFIZER(Purple Top)SARS-COV-2 Vaccination 09/21/2019    TDAP status: Due, Education has been provided regarding  the importance of this vaccine. Advised may receive this vaccine at local pharmacy or Health Dept. Aware to provide a copy of the vaccination record if obtained from local pharmacy or Health Dept. Verbalized acceptance and understanding.  Flu Vaccine status: Due, Education has been provided regarding the importance of this vaccine. Advised may receive this vaccine at local pharmacy or Health Dept. Aware to provide a copy of the vaccination record if obtained from local pharmacy or Health Dept. Verbalized acceptance and understanding.  Pneumococcal vaccine status: Due, Education has been provided regarding the importance of this vaccine. Advised may receive this vaccine at local pharmacy or Health Dept. Aware to provide a copy of the vaccination record if  obtained from local pharmacy or Health Dept. Verbalized acceptance and understanding.  Covid-19 vaccine status: Information provided on how to obtain vaccines.   Qualifies for Shingles Vaccine? Yes   Zostavax completed No   Shingrix Completed?: No.    Education has been provided regarding the importance of this vaccine. Patient has been advised to call insurance company to determine out of pocket expense if they have not yet received this vaccine. Advised may also receive vaccine at local pharmacy or Health Dept. Verbalized acceptance and understanding.  Screening Tests Health Maintenance  Topic Date Due   FOOT EXAM  Never done   OPHTHALMOLOGY EXAM  Never done   Diabetic kidney evaluation - Urine ACR  Never done   DTaP/Tdap/Td (1 - Tdap) Never done   Zoster Vaccines- Shingrix (1 of 2) 08/01/2023 (Originally 01/07/1970)   Pneumonia Vaccine 78+ Years old (1 of 1 - PCV) 08/28/2023 (Originally 01/06/2014)   INFLUENZA VACCINE  11/04/2023 (Originally 03/07/2023)   COVID-19 Vaccine (2 - Pfizer risk series) 04/29/2024 (Originally 10/12/2019)   HEMOGLOBIN A1C  05/31/2023   Colonoscopy  12/13/2023   Diabetic kidney evaluation - eGFR measurement  01/09/2024   Medicare Annual Wellness (AWV)  05/19/2024   MAMMOGRAM  12/13/2024   DEXA SCAN  Completed   Hepatitis C Screening  Completed   HPV VACCINES  Aged Out    Health Maintenance  Health Maintenance Due  Topic Date Due   FOOT EXAM  Never done   OPHTHALMOLOGY EXAM  Never done   Diabetic kidney evaluation - Urine ACR  Never done   DTaP/Tdap/Td (1 - Tdap) Never done    Colorectal cancer screening: Type of screening: Colonoscopy. Completed 12/12/20. Repeat every 3 years  Mammogram status: Completed 12/14/22. Repeat every year  Bone Density status: Completed 12/20/20. Results reflect: Bone density results: NORMAL. Repeat every 2 years.   Additional Screening:  Hepatitis C Screening: 02/22/22 Completed   Vision Screening: Recommended annual  ophthalmology exams for early detection of glaucoma and other disorders of the eye. Is the patient up to date with their annual eye exam?  No  Who is the provider or what is the name of the office in which the patient attends annual eye exams? [facility]  If pt is not established with a provider, would they like to be referred to a provider to establish care? No .   Dental Screening: Recommended annual dental exams for proper oral hygiene  Diabetic Foot Exam: Diabetic Foot Exam: Overdue, Pt has been advised about the importance in completing this exam. Pt is scheduled for diabetic foot exam on next appt .  Community Resource Referral / Chronic Care Management: CRR required this visit?  No   CCM required this visit?  No     Plan:  I have personally reviewed and noted the following in the patient's chart:   Medical and social history Use of alcohol, tobacco or illicit drugs  Current medications and supplements including opioid prescriptions. Patient is not currently taking opioid prescriptions. Functional ability and status Nutritional status Physical activity Advanced directives List of other physicians Hospitalizations, surgeries, and ER visits in previous 12 months Vitals Screenings to include cognitive, depression, and falls Referrals and appointments  In addition, I have reviewed and discussed with patient certain preventive protocols, quality metrics, and best practice recommendations. A written personalized care plan for preventive services as well as general preventive health recommendations were provided to patient.     Marzella Schlein, LPN   16/05/[id number]   After Visit Summary: (MyChart) Due to this being a telephonic visit, the after visit summary with patients personalized plan was offered to patient via MyChart   Nurse Notes: none

## 2023-06-12 ENCOUNTER — Ambulatory Visit: Payer: No Typology Code available for payment source | Admitting: Obstetrics and Gynecology

## 2023-06-12 DIAGNOSIS — H52223 Regular astigmatism, bilateral: Secondary | ICD-10-CM | POA: Diagnosis not present

## 2023-06-12 DIAGNOSIS — H5203 Hypermetropia, bilateral: Secondary | ICD-10-CM | POA: Diagnosis not present

## 2023-07-11 ENCOUNTER — Ambulatory Visit: Payer: No Typology Code available for payment source | Admitting: Family Medicine

## 2023-07-11 ENCOUNTER — Encounter: Payer: Self-pay | Admitting: Family Medicine

## 2023-07-11 VITALS — BP 126/76 | HR 81 | Temp 98.0°F | Resp 16 | Ht 64.0 in | Wt 208.5 lb

## 2023-07-11 DIAGNOSIS — E034 Atrophy of thyroid (acquired): Secondary | ICD-10-CM

## 2023-07-11 DIAGNOSIS — Z170 Estrogen receptor positive status [ER+]: Secondary | ICD-10-CM | POA: Diagnosis not present

## 2023-07-11 DIAGNOSIS — C50511 Malignant neoplasm of lower-outer quadrant of right female breast: Secondary | ICD-10-CM

## 2023-07-11 DIAGNOSIS — E782 Mixed hyperlipidemia: Secondary | ICD-10-CM

## 2023-07-11 DIAGNOSIS — E1165 Type 2 diabetes mellitus with hyperglycemia: Secondary | ICD-10-CM | POA: Diagnosis not present

## 2023-07-11 DIAGNOSIS — I10 Essential (primary) hypertension: Secondary | ICD-10-CM | POA: Diagnosis not present

## 2023-07-11 DIAGNOSIS — G43009 Migraine without aura, not intractable, without status migrainosus: Secondary | ICD-10-CM | POA: Diagnosis not present

## 2023-07-11 LAB — CBC WITH DIFFERENTIAL/PLATELET
Basophils Absolute: 0.1 10*3/uL (ref 0.0–0.1)
Basophils Relative: 0.6 % (ref 0.0–3.0)
Eosinophils Absolute: 0.4 10*3/uL (ref 0.0–0.7)
Eosinophils Relative: 5.4 % — ABNORMAL HIGH (ref 0.0–5.0)
HCT: 43.7 % (ref 36.0–46.0)
Hemoglobin: 15 g/dL (ref 12.0–15.0)
Lymphocytes Relative: 21.1 % (ref 12.0–46.0)
Lymphs Abs: 1.7 10*3/uL (ref 0.7–4.0)
MCHC: 34.4 g/dL (ref 30.0–36.0)
MCV: 96.4 fL (ref 78.0–100.0)
Monocytes Absolute: 0.8 10*3/uL (ref 0.1–1.0)
Monocytes Relative: 10.1 % (ref 3.0–12.0)
Neutro Abs: 5 10*3/uL (ref 1.4–7.7)
Neutrophils Relative %: 62.8 % (ref 43.0–77.0)
Platelets: 287 10*3/uL (ref 150.0–400.0)
RBC: 4.53 Mil/uL (ref 3.87–5.11)
RDW: 13.6 % (ref 11.5–15.5)
WBC: 8 10*3/uL (ref 4.0–10.5)

## 2023-07-11 LAB — COMPREHENSIVE METABOLIC PANEL
ALT: 62 U/L — ABNORMAL HIGH (ref 0–35)
AST: 36 U/L (ref 0–37)
Albumin: 4.5 g/dL (ref 3.5–5.2)
Alkaline Phosphatase: 67 U/L (ref 39–117)
BUN: 15 mg/dL (ref 6–23)
CO2: 29 meq/L (ref 19–32)
Calcium: 10 mg/dL (ref 8.4–10.5)
Chloride: 102 meq/L (ref 96–112)
Creatinine, Ser: 0.5 mg/dL (ref 0.40–1.20)
GFR: 92.34 mL/min (ref >60.00)
Glucose, Bld: 112 mg/dL — ABNORMAL HIGH (ref 70–99)
Potassium: 4.2 meq/L (ref 3.5–5.1)
Sodium: 138 meq/L (ref 135–145)
Total Bilirubin: 0.6 mg/dL (ref 0.2–1.2)
Total Protein: 7 g/dL (ref 6.0–8.3)

## 2023-07-11 LAB — LIPID PANEL
Cholesterol: 196 mg/dL (ref 0–200)
HDL: 39.4 mg/dL (ref >39.00)
LDL Cholesterol: 116 mg/dL — ABNORMAL HIGH (ref 0–99)
NonHDL: 156.82
Total CHOL/HDL Ratio: 5
Triglycerides: 204 mg/dL — ABNORMAL HIGH (ref 0.0–149.0)
VLDL: 40.8 mg/dL — ABNORMAL HIGH (ref 0.0–40.0)

## 2023-07-11 LAB — MICROALBUMIN / CREATININE URINE RATIO
Creatinine,U: 94.7 mg/dL
Microalb Creat Ratio: 1.1 mg/g (ref 0.0–30.0)
Microalb, Ur: 1 mg/dL (ref 0.0–1.9)

## 2023-07-11 LAB — T4, FREE: Free T4: 0.6 ng/dL (ref 0.60–1.60)

## 2023-07-11 LAB — HEMOGLOBIN A1C: Hgb A1c MFr Bld: 6.6 % — ABNORMAL HIGH (ref 4.6–6.5)

## 2023-07-11 LAB — TSH: TSH: 1.96 u[IU]/mL (ref 0.35–5.50)

## 2023-07-11 LAB — T3, FREE: T3, Free: 4.6 pg/mL — ABNORMAL HIGH (ref 2.3–4.2)

## 2023-07-11 MED ORDER — BUTALBITAL-APAP-CAFFEINE 50-300-40 MG PO CAPS: 1.0000 | ORAL_CAPSULE | Freq: Four times a day (QID) | ORAL | 0 refills | Status: DC | PRN

## 2023-07-11 NOTE — Assessment & Plan Note (Signed)
S/p lumpectomy.  No further tx

## 2023-07-11 NOTE — Patient Instructions (Addendum)
It was very nice to see you today!  Happy Holidays!  Check monthly blood pressure.  More often if needed  ideal goal <135/85.  For sure <140/90. Check sugars every 1-2 weeks. Fasting <130.  Before meal <140, 2 hrs after meal <160.    PLEASE NOTE:  If you had any lab tests please let us know if you have not heard back within a few days. You may see your results on MyChart before we have a chance to review them but we will give you a call once they are reviewed by [place]. If we ordered any referrals today, please let us know if you have not heard from their office within the next week.   Please try these tips to maintain a healthy lifestyle:  Eat most of your calories during the day when you are active. Eliminate processed foods including packaged sweets (pies, cakes, cookies), reduce intake of potatoes, white bread, white pasta, and white rice. Look for whole grain options, oat flour or almond flour.  Each meal should contain half fruits/vegetables, one quarter protein, and one quarter carbs (no bigger than a computer mouse).  Cut down on sweet beverages. This includes juice, soda, and sweet tea. Also watch fruit intake, though this is a healthier sweet option, it still contains natural sugar! Limit to 3 servings daily.  Drink at least 1 glass of water with each meal and aim for at least 8 glasses per day  Exercise at least 150 minutes every week.

## 2023-07-11 NOTE — Progress Notes (Signed)
Subjective:     Patient ID: Monica Rogers, female    DOB: October 07, 1950, 72 y.o.   MRN: [id number]  Chief Complaint  Patient presents with   Follow-up    DM type 2, HTN     HPI  HTN - Pt is on benicar 20 mg. Has not been checking Bp at home. No ha/dizziness/cp/palp/edema/cough/sob.  walking  DM - She reports her last A1C was 6.9 at oncology in June. Previously her A1C was 6.4 in 11/2022. She has been exercising regularly. She tends to eat plenty carbs, especially bread. States she often eats bread when she eats at restaurants. Also eats pizza at least once a week. Has been trying to be more intentional about limiting carb intake. She has a glucometer at home and plans to start monitor it more. Previously when she would check her sugars at home, it would run in the 120s or lower.   New dx of breast cancer - She reports a new dx of breast cancer in 2 spots of her right breast. She had lumpectomy  02/26/2023. She has been following up at the [facility]. Not doing chemo nor radiation.   Upper glute pain - She complains of upper left glute pain, starting about a year ago, but it is recently occurring more frequently.  The pain at time radiates down her left leg. Her pain worsens at the end of the day and when turning over in bed, can be bad.. She sometimes is unable to turn over in bed due to the pain. She also notes her glute is warm to touch at times. She has not done any PT, but last year did pelvic floor exercises. She was seen at [facility] thinking it may be a hip issue. She states that imaging revealed no abnormalities.   HLD - Trig elevated at 200 and LDL elevated at 112, per labs from 11/30/2022. Rosuvastatin was discontinued-pt concerns.   Hypothyroidism - On armour 120 mg daily. And on iodine  Migraines - She has migraines once every 1.5-2 months. Takes Fioricet as needed and is requesting a refill.   Has been using estrogen cream to help with hx of recurrent UTIs, which  has helped significantly.    Health Maintenance Due  Topic Date Due   Diabetic kidney evaluation - Urine ACR  Never done   DTaP/Tdap/Td (1 - Tdap) Never done   HEMOGLOBIN A1C  05/31/2023    Past Medical History:  Diagnosis Date   Allergy    Anemia    Arthritis    Blood transfusion without reported diagnosis    Breast cancer (HCC) 2024   R   Chronic headaches    Diverticulosis    Hyperlipidemia    Hypertension    Hypothyroidism    IBS (irritable bowel syndrome)    Prediabetes    Recurrent UTI     Past Surgical History:  Procedure Laterality Date   BREAST BIOPSY Right 2019   BREAST BIOPSY Right 12/25/2022   [place] RT BREAST BX W LOC DEV 1ST LESION IMG BX SPEC US GUIDE 12/25/2022 [facility]   BREAST BIOPSY Right 01/01/2023   [place] RT BREAST BX W LOC DEV 1ST LESION IMG BX SPEC US GUIDE 01/01/2023 [facility]   BREAST BIOPSY  02/25/2023   MM RT RADIOACTIVE SEED LOC MAMMO GUIDE 02/25/2023 [facility]   BREAST BIOPSY  02/25/2023   MM RT RADIOACTIVE SEED EA ADD LESION LOC MAMMO GUIDE 02/25/2023 [facility]   BREAST  LUMPECTOMY WITH RADIOACTIVE SEED LOCALIZATION Right 02/26/2023   Procedure: RIGHT BREAST BRACKETED SEED LUMPECTOMY;  Surgeon: Harriette Bouillon, MD;  Location: [facility];  Service: General;  Laterality: Right;   IR ANGIOGRAM SELECTIVE EACH ADDITIONAL VESSEL  09/28/2020   IR ANGIOGRAM SELECTIVE EACH ADDITIONAL VESSEL  09/28/2020   IR ANGIOGRAM SELECTIVE EACH ADDITIONAL VESSEL  09/28/2020   IR ANGIOGRAM SELECTIVE EACH ADDITIONAL VESSEL  09/28/2020   IR ANGIOGRAM VISCERAL SELECTIVE  09/28/2020   IR ANGIOGRAM VISCERAL SELECTIVE  09/28/2020   IR EMBO ART  VEN HEMORR LYMPH EXTRAV  INC GUIDE ROADMAPPING  09/28/2020   IR US GUIDE VASC ACCESS RIGHT  09/28/2020   TONSILLECTOMY       Current Outpatient Medications:    AMBULATORY NON FORMULARY MEDICATION, Bone Up 1 tablet in the morning and 1 in the afternoon, Disp: , Rfl:    ARMOUR THYROID 120  MG tablet, TAKE 1 TABLET BY MOUTH EVERY DAY, Disp: 90 tablet, Rfl: 4   Ascorbic Acid (VITAMIN C) 100 MG CHEW, , Disp: , Rfl:    B Complex Vitamins (VITAMIN B COMPLEX 100 IJ), , Disp: , Rfl:    Blood Glucose Monitoring Suppl (ONETOUCH VERIO FLEX SYSTEM) DEVI, USE TO TEST BLOOD SUGAR DAILY, Disp: , Rfl:    Butalbital-APAP-Caffeine 50-300-40 MG CAPS, Take 1 capsule by mouth every 6 (six) hours as needed., Disp: 14 capsule, Rfl: 0   CALCIUM PO, Take 1 tablet by mouth daily., Disp: , Rfl:    Carboxymethylcellulose Sodium (EYE DROPS OP), Apply 2 drops to eye 2 (two) times daily as needed (dry eyes)., Disp: , Rfl:    Cholecalciferol (VITAMIN D3) 50 MCG (2000 UT) capsule, , Disp: , Rfl:    clotrimazole-betamethasone (LOTRISONE) cream, Apply a small amount to affected area 1-2 times per day for 14 days, then stop and use as needed., Disp: , Rfl:    Coenzyme Q10 (COQ-10) 100 MG capsule, , Disp: , Rfl:    dexamethasone (DECADRON) 0.1 % ophthalmic solution, SMARTSIG:2 Drop(s) In Ear(s), Disp: , Rfl:    Digestive Enzymes (ENZYME DIGEST) CAPS, Take 2 capsules by mouth daily., Disp: , Rfl:    glucose blood (ONETOUCH VERIO) test strip, Use as instructed, Disp: 100 each, Rfl: 2   Lancets (ONETOUCH DELICA PLUS LANCET30G) MISC, See admin instructions., Disp: , Rfl:    Magnesium 110 MG CAPS, Take 2 capsules by mouth 2 (two) times daily., Disp: , Rfl:    olmesartan (BENICAR) 20 MG tablet, Take 1 tablet (20 mg total) by mouth daily., Disp: 90 tablet, Rfl: 3   Omega-3 Fatty Acids (FISH OIL PO), Take 1 tablet by mouth 2 (two) times daily., Disp: , Rfl:    QUERCETIN PO, Take 2 tablets by mouth 3 (three) times daily., Disp: , Rfl:    Testosterone 1.62 % GEL, , Disp: , Rfl:    Zinc Sulfate 66 MG TABS, Take 132 mg by mouth daily., Disp: , Rfl:   Allergies  Allergen Reactions   Bacid Cough   Timothy Grass Pollen Allergen Other (See Comments) and Cough   Wheat Cough   ROS neg/noncontributory except as noted  HPI/below      Objective:     BP 126/76   Pulse 81   Temp 98 F (36.7 C) (Oral)   Resp 16   Ht 5\' 4"  (1.626 m)   Wt 208 lb 8 oz (94.6 kg)   SpO2 96%   BMI 35.79 kg/m  Wt Readings from Last 3 Encounters:  07/11/23 208 lb 8 oz (94.6 kg)  05/20/23 208 lb (94.3 kg)  04/30/23 208 lb (94.3 kg)    Physical Exam   Gen: WDWN NAD HEENT: NCAT, conjunctiva not injected, sclera nonicteric NECK:  supple, no thyromegaly, no nodes, no carotid bruits CARDIAC: RRR, S1S2+, no murmur. DP 2+B LUNGS: CTAB. No wheezes ABDOMEN:  BS+, soft, NTND, No HSM, no masses EXT:  no edema MSK: no gross abnormalities.  NEURO: A&O x3.  CN II-XII intact.  PSYCH: normal mood. Good eye contact Diabetic Foot Exam - Simple   Simple Foot Form Diabetic Foot exam was performed with the following findings: Yes 07/11/2023 12:05 PM  Visual Inspection No deformities, no ulcerations, no other skin breakdown bilaterally: Yes Sensation Testing Intact to touch and monofilament testing bilaterally: Yes Pulse Check Posterior Tibialis and Dorsalis pulse intact bilaterally: Yes Comments        Assessment & Plan:  Type 2 diabetes mellitus with hyperglycemia, without long-term current use of insulin (HCC) Assessment & Plan: Chronic.  Controlled.  Working on diet/exercise.  Check every 1-2 wks  Orders: -     Comprehensive metabolic panel -     Hemoglobin A1c -     Microalbumin / creatinine urine ratio  Primary hypertension Assessment & Plan: Chronic.  Controlled.  Continue olmesartan 20mg  daily  Orders: -     CBC with Differential/Platelet -     Comprehensive metabolic panel  Hypothyroidism due to acquired atrophy of thyroid Assessment & Plan: Chronic.  Controlled.  Continue armour 120 daily  Orders: -     TSH -     T3, free -     T4, free  Mixed hyperlipidemia Assessment & Plan: Chronic.  Was "controlled", but now w/newer dx DM-goal LDL <100.  Will work on diet/exercise.  May need to restart  crestor  Orders: -     Lipid panel  Malignant neoplasm of lower-outer quadrant of right breast of female, estrogen receptor positive (HCC) Assessment & Plan: S/p lumpectomy.  No further tx   Migraine without aura and without status migrainosus, not intractable Assessment & Plan: Chronic.  Occ occurs.  Renewed fioricet(pt prefers caps).  PDMP checked  Orders: -     Butalbital-APAP-Caffeine; Take 1 capsule by mouth every 6 (six) hours as needed.  Dispense: 14 capsule; Refill: 0     Return in about 6 months (around 01/09/2024) for chronic follow-up.   Angelena Sole, MD

## 2023-07-11 NOTE — Assessment & Plan Note (Signed)
Chronic.  Was "controlled", but now w/newer dx DM-goal LDL <100.  Will work on diet/exercise.  May need to restart crestor

## 2023-07-11 NOTE — Assessment & Plan Note (Signed)
Chronic.  Controlled.  Continue armour 120 daily

## 2023-07-11 NOTE — Assessment & Plan Note (Signed)
Chronic.  Occ occurs.  Renewed fioricet(pt prefers caps).  PDMP checked

## 2023-07-11 NOTE — Assessment & Plan Note (Signed)
Chronic.  Controlled.  Continue olmesartan 20mg  daily

## 2023-07-11 NOTE — Progress Notes (Signed)
Sugars increased a little-continue diet/exercise.    Cholesterol should be better.  Restart rosuvastatin 10mg (send rx).   Repeat cmp,lipids in 2-3 months as I want to monitor liver since sl elevated

## 2023-07-11 NOTE — Assessment & Plan Note (Signed)
Chronic.  Controlled.  Working on diet/exercise.  Check every 1-2 wks

## 2023-07-12 ENCOUNTER — Other Ambulatory Visit: Payer: Self-pay | Admitting: *Deleted

## 2023-07-12 DIAGNOSIS — R7989 Other specified abnormal findings of blood chemistry: Secondary | ICD-10-CM

## 2023-07-12 DIAGNOSIS — E034 Atrophy of thyroid (acquired): Secondary | ICD-10-CM

## 2023-07-12 MED ORDER — ROSUVASTATIN CALCIUM 10 MG PO TABS: 10.0000 mg | ORAL_TABLET | Freq: Every day | ORAL | 0 refills | Status: DC

## 2023-07-14 NOTE — Progress Notes (Unsigned)
ASSESSMENT    Brief Narrative:  72 y.o.  female known to Dr. Lavon Paganini with a past medical history not limited to sigmoid diverticulitis /diverticular hemorrhage status post embolism of the left colic artery in February 2020, adenomatous colon polyps hypertension, hyperlipidemia, hypothyroidism, breast cancer,    History of adenomatous colon polyps.   See PMH below for additional history  PLAN     Three year surveillance colonoscopy due May 2025    HPI   Chief complaint :         Procedure risk assessment:  No history of CHF.  No supplemental 02 use at home.  Not a known difficult airway Anticoagulant:     GI History / Pertinent GI Studies    **All endoscopic studies may not be included here    May 2022 - One less than 1 mm polyp in the ascending colon, removed with a cold biopsy forceps. Resected and retrieved. - One 12 mm polyp in the transverse colon, removed piecemeal using a cold snare. Resected and retrieved. - Severe diverticulosis in the sigmoid colon, in the descending colon, in the transverse colon, in the ascending colon and in the cecum. Purulent discharge was seen in association with the diverticular opening, suspicious of diverticulitis. - Non-bleeding external and internal hemorrhoids.   Surgical [P], colon, transverse and ascending (cbx), polyp (2) - TUBULAR ADENOMA (X MULTIPLE). - NEGATIVE FOR HIGH GRADE DYSPLASIA.      Latest Ref Rng & Units 07/11/2023   12:25 PM 01/09/2023   12:19 PM 11/29/2022   11:38 AM  Hepatic Function  Total Protein 6.0 - 8.3 g/dL 7.0  7.6  7.1   Albumin 3.5 - 5.2 g/dL 4.5  4.6  4.4   AST 0 - 37 U/L 36  52  36   ALT 0 - 35 U/L 62  77  57   Alk Phosphatase 39 - 117 U/L 67  73  64   Total Bilirubin 0.2 - 1.2 mg/dL 0.6  0.7  0.7   Bilirubin, Direct 0.0 - 0.3 mg/dL   0.2        Latest Ref Rng & Units 07/11/2023   12:25 PM 01/09/2023   12:19 PM 08/27/2022   11:36 AM  CBC  WBC 4.0 - 10.5 K/uL 8.0  8.2  7.5    Hemoglobin 12.0 - 15.0 g/dL 91.4  78.2  95.6   Hematocrit 36.0 - 46.0 % 43.7  45.4  43.4   Platelets 150.0 - 400.0 K/uL 287.0  283  282.0      Past Medical History:  Diagnosis Date   Allergy    Anemia    Arthritis    Blood transfusion without reported diagnosis    Breast cancer (HCC) 2024   R   Chronic headaches    Diverticulosis    Hyperlipidemia    Hypertension    Hypothyroidism    IBS (irritable bowel syndrome)    Prediabetes    Recurrent UTI     Past Surgical History:  Procedure Laterality Date   BREAST BIOPSY Right 2019   BREAST BIOPSY Right 12/25/2022   [place] RT BREAST BX W LOC DEV 1ST LESION IMG BX SPEC US GUIDE 12/25/2022 [facility]   BREAST BIOPSY Right 01/01/2023   [place] RT BREAST BX W LOC DEV 1ST LESION IMG BX SPEC US GUIDE 01/01/2023 [facility]   BREAST BIOPSY  02/25/2023   MM RT RADIOACTIVE SEED LOC MAMMO GUIDE 02/25/2023 [facility]   BREAST BIOPSY  02/25/2023   MM RT RADIOACTIVE SEED EA ADD LESION LOC MAMMO GUIDE 02/25/2023 [facility]   BREAST LUMPECTOMY WITH RADIOACTIVE SEED LOCALIZATION Right 02/26/2023   Procedure: RIGHT BREAST BRACKETED SEED LUMPECTOMY;  Surgeon: Harriette Bouillon, MD;  Location: [facility];  Service: General;  Laterality: Right;   IR ANGIOGRAM SELECTIVE EACH ADDITIONAL VESSEL  09/28/2020   IR ANGIOGRAM SELECTIVE EACH ADDITIONAL VESSEL  09/28/2020   IR ANGIOGRAM SELECTIVE EACH ADDITIONAL VESSEL  09/28/2020   IR ANGIOGRAM SELECTIVE EACH ADDITIONAL VESSEL  09/28/2020   IR ANGIOGRAM VISCERAL SELECTIVE  09/28/2020   IR ANGIOGRAM VISCERAL SELECTIVE  09/28/2020   IR EMBO ART  VEN HEMORR LYMPH EXTRAV  INC GUIDE ROADMAPPING  09/28/2020   IR US GUIDE VASC ACCESS RIGHT  09/28/2020   TONSILLECTOMY      Family History  Problem Relation Age of Onset   Hearing loss Mother    Miscarriages / [place] Mother    Alcohol abuse Father    Hearing loss Father    Hypertension Father    Diabetes Brother    Cancer  Maternal Grandmother        possibly had breast cancer dx. in her 30s   Diabetes Paternal Grandmother     Current Medications, Allergies, Family History and Social History were reviewed in [employer] record.     Current Outpatient Medications  Medication Sig Dispense Refill   AMBULATORY NON FORMULARY MEDICATION Bone Up 1 tablet in the morning and 1 in the afternoon     ARMOUR THYROID 120 MG tablet TAKE 1 TABLET BY MOUTH EVERY DAY 90 tablet 4   Ascorbic Acid (VITAMIN C) 100 MG CHEW      B Complex Vitamins (VITAMIN B COMPLEX 100 IJ)      Blood Glucose Monitoring Suppl (ONETOUCH VERIO FLEX SYSTEM) DEVI USE TO TEST BLOOD SUGAR DAILY     Butalbital-APAP-Caffeine 50-300-40 MG CAPS Take 1 capsule by mouth every 6 (six) hours as needed. 14 capsule 0   CALCIUM PO Take 1 tablet by mouth daily.     Carboxymethylcellulose Sodium (EYE DROPS OP) Apply 2 drops to eye 2 (two) times daily as needed (dry eyes).     Cholecalciferol (VITAMIN D3) 50 MCG (2000 UT) capsule      clotrimazole-betamethasone (LOTRISONE) cream Apply a small amount to affected area 1-2 times per day for 14 days, then stop and use as needed.     Coenzyme Q10 (COQ-10) 100 MG capsule      dexamethasone (DECADRON) 0.1 % ophthalmic solution SMARTSIG:2 Drop(s) In Ear(s)     Digestive Enzymes (ENZYME DIGEST) CAPS Take 2 capsules by mouth daily.     glucose blood (ONETOUCH VERIO) test strip Use as instructed 100 each 2   Lancets (ONETOUCH DELICA PLUS LANCET30G) MISC See admin instructions.     Magnesium 110 MG CAPS Take 2 capsules by mouth 2 (two) times daily.     olmesartan (BENICAR) 20 MG tablet Take 1 tablet (20 mg total) by mouth daily. 90 tablet 3   Omega-3 Fatty Acids (FISH OIL PO) Take 1 tablet by mouth 2 (two) times daily.     QUERCETIN PO Take 2 tablets by mouth 3 (three) times daily.     rosuvastatin (CRESTOR) 10 MG tablet Take 1 tablet (10 mg total) by mouth daily. 90 tablet 0   Testosterone 1.62 % GEL       Zinc Sulfate 66 MG TABS Take 132 mg by mouth daily.  No current facility-administered medications for this visit.    Review of Systems: No chest pain. No shortness of breath. No urinary complaints.    Physical Exam  There were no vitals filed for this visit. Wt Readings from Last 3 Encounters:  07/11/23 208 lb 8 oz (94.6 kg)  05/20/23 208 lb (94.3 kg)  04/30/23 208 lb (94.3 kg)    There were no vitals taken for this visit. Constitutional:  Pleasant, generally well appearing ***female in no acute distress. Psychiatric: Normal mood and affect. Behavior is normal. EENT: Pupils normal.  Conjunctivae are normal. No scleral icterus. Neck supple.  Cardiovascular: Normal rate, regular rhythm.  Pulmonary/chest: Effort normal and breath sounds normal. No wheezing, rales or rhonchi. Abdominal: Soft, nondistended, nontender. Bowel sounds active throughout. There are no masses palpable. No hepatomegaly. Neurological: Alert and oriented to person place and time.  Extremities: *** edema  Willette Cluster, NP  07/14/2023, 7:37 PM  Cc:  Jeani Sow, MD

## 2023-07-15 ENCOUNTER — Encounter: Payer: Self-pay | Admitting: Nurse Practitioner

## 2023-07-15 ENCOUNTER — Ambulatory Visit (INDEPENDENT_AMBULATORY_CARE_PROVIDER_SITE_OTHER): Payer: No Typology Code available for payment source | Admitting: Nurse Practitioner

## 2023-07-15 VITALS — BP 116/80 | HR 96 | Ht 64.0 in | Wt 209.0 lb

## 2023-07-15 DIAGNOSIS — Z8719 Personal history of other diseases of the digestive system: Secondary | ICD-10-CM

## 2023-07-15 DIAGNOSIS — R1030 Lower abdominal pain, unspecified: Secondary | ICD-10-CM | POA: Diagnosis not present

## 2023-07-15 DIAGNOSIS — R7989 Other specified abnormal findings of blood chemistry: Secondary | ICD-10-CM

## 2023-07-15 DIAGNOSIS — Z860101 Personal history of adenomatous and serrated colon polyps: Secondary | ICD-10-CM

## 2023-07-15 MED ORDER — AMOXICILLIN-POT CLAVULANATE 875-125 MG PO TABS
1.0000 | ORAL_TABLET | Freq: Two times a day (BID) | ORAL | 0 refills | Status: AC
Start: 2023-07-15 — End: 2023-07-22

## 2023-07-15 NOTE — Patient Instructions (Addendum)
Start Miralax 1 capful daily in 8 ounces of liquid as needed.  Continue daily fiber.   We have sent the following medications to your pharmacy for you to pick up at your convenience: Augmentin 875 mg twice daily for 7 days, take if any recurrent diverticulitis symptoms.   Call or send Mychart message if you start antibiotics.   _______________________________________________________  If your blood pressure at your visit was 140/90 or greater, please contact your primary care physician to follow up on this.  _______________________________________________________  If you are age 49 or older, your body mass index should be between 23-30. Your Body mass index is 35.87 kg/m. If this is out of the aforementioned range listed, please consider follow up with your Primary Care Provider.  If you are age 90 or younger, your body mass index should be between 19-25. Your Body mass index is 35.87 kg/m. If this is out of the aformentioned range listed, please consider follow up with your Primary Care Provider.   ________________________________________________________  The [facility] providers would like to encourage you to use [facility] to communicate with providers for non-urgent requests or questions.  Due to long hold times on the telephone, sending your provider a message by [facility] may be a faster and more efficient way to get a response.  Please allow 48 business hours for a response.  Please remember that this is for non-urgent requests.  _______________________________________________________

## 2023-08-08 ENCOUNTER — Encounter: Payer: Self-pay | Admitting: Adult Health

## 2023-08-08 ENCOUNTER — Inpatient Hospital Stay: Payer: No Typology Code available for payment source | Attending: Adult Health | Admitting: Adult Health

## 2023-08-08 VITALS — BP 153/69 | HR 81 | Temp 98.0°F | Resp 20 | Wt 210.5 lb

## 2023-08-08 DIAGNOSIS — C50511 Malignant neoplasm of lower-outer quadrant of right female breast: Secondary | ICD-10-CM | POA: Diagnosis not present

## 2023-08-08 DIAGNOSIS — Z170 Estrogen receptor positive status [ER+]: Secondary | ICD-10-CM | POA: Insufficient documentation

## 2023-08-08 NOTE — Progress Notes (Signed)
 SURVIVORSHIP VISIT:  BRIEF ONCOLOGIC HISTORY:  Oncology History  Malignant neoplasm of lower-outer quadrant of right breast of female, estrogen receptor positive (HCC)  01/01/2023 Initial Diagnosis   Screening mammogram detected right breast mass: 2 adjacent masses at 7 o'clock position measuring 0.9 cm and 0.6 cm.  Biopsy of 0.9 cm mass: Grade 2 IDC with micropapillary features ER 90%, PR 90%, HER2 positive, Ki-67 20%; biopsy of the 0.6 cm mass is grade 2 IDC with micropapillary features ER 100%, PR 80%, HER2 negative, Ki67 20%, axilla negative   01/09/2023 Cancer Staging   Staging form: Breast, AJCC 8th Edition - Clinical stage from 01/09/2023: Stage IA (cT1b, cN0, cM0, G2, ER+, PR+, HER2+) - Signed by Lanell Donald Stagger, PA-C on 01/09/2023 Stage prefix: Initial diagnosis Method of lymph node assessment: Clinical Histologic grading system: 3 grade system    Genetic Testing   [facility] Custom Panel+RNA was Negative. Report date is 01/17/2023.  The Custom Hereditary Cancers Panel offered by [facility] includes sequencing and/or deletion duplication testing of the following 43 genes: APC, ATM, AXIN2, BAP1, BARD1, BMPR1A, BRCA1, BRCA2, BRIP1, CDH1, CDK4, CDKN2A (p14ARF and p16INK4a only), CHEK2, CTNNA1, EPCAM (Deletion/duplication testing only), FH, GREM1 (promoter region duplication testing only), HOXB13, KIT, MBD4, MEN1, MLH1, MSH2, MSH3, MSH6, MUTYH, NF1, NHTL1, PALB2, PDGFRA, PMS2, POLD1, POLE, PTEN, RAD51C, RAD51D, SMAD4, SMARCA4. STK11, TP53, TSC1, TSC2, and VHL.   02/26/2023 Surgery   Right lumpectomy: Grade 2 IDC with micropapillary features 1.8 cm, margins negative, lymphovascular invasion not identified, ER 100%, PR 80%, Ki67 20%, HER2 negative Smaller mass: ER 90%, PR 90%, Ki67 20%, HER2 positive Repeat prognostic panel ER 90%, PR 85%, Ki67 10%, HER2 1+   02/26/2023 Oncotype testing   13/4%, no chemo benefit   03/2023 -  Radiation Therapy   Opted to forego     INTERVAL HISTORY:     Discussed the use of AI scribe software for clinical note transcription with the patient, who gave verbal consent to proceed.  Monica Rogers to review her survivorship care plan detailing her treatment course for breast cancer, as well as monitoring long-term side effects of that treatment, education regarding health maintenance, screening, and overall wellness and health promotion.     Monica Rogers a history of stage 1A right-sided breast cancer, presents for a follow-up consultation post-surgery. She reports overall good health since the operation, with no new symptoms or complications. However, she has noticed a slight increase in warmth and swelling on the surgical side, which she attributes to possible inflammation.  The patient has decided against taking prescribed antiestrogen therapy after extensive personal research, citing more cons than pros. She expresses concerns about the food industry and maintains a vigilant approach towards her diet. She also engages in regular exercise, meeting the recommended 150-200 minutes per week.  The patient has been experiencing hot flashes, which have been particularly challenging to manage. She reports having to adjust the heat in her home frequently due to these episodes. Despite these challenges, the patient remains proactive in seeking solutions, expressing interest in products that could help manage her symptoms.  The patient is also mindful of her weight and is considering intermittent fasting as a potential strategy for weight management. She expresses a strong commitment to maintaining a healthy lifestyle, including regular primary care visits and staying up-to-date with other cancer screenings.  REVIEW OF SYSTEMS:  Review of Systems  Constitutional:  Negative for appetite change, chills, fatigue, fever and unexpected weight change.  HENT:   Negative for  hearing loss, lump/mass and trouble swallowing.   Eyes:  Negative for eye problems and  icterus.  Respiratory:  Negative for chest tightness, cough and shortness of breath.   Cardiovascular:  Negative for chest pain, leg swelling and palpitations.  Gastrointestinal:  Negative for abdominal distention, abdominal pain, constipation, diarrhea, nausea and vomiting.  Endocrine: Negative for hot flashes.  Genitourinary:  Negative for difficulty urinating.   Musculoskeletal:  Negative for arthralgias.  Skin:  Negative for itching and rash.  Neurological:  Negative for dizziness, extremity weakness, headaches and numbness.  Hematological:  Negative for adenopathy. Does not bruise/bleed easily.  Psychiatric/Behavioral:  Negative for depression. The patient is not nervous/anxious.    Breast: Denies any new nodularity, masses, tenderness, nipple changes, or nipple discharge.       PAST MEDICAL/SURGICAL HISTORY:  Past Medical History:  Diagnosis Date   Allergy    Anemia    Arthritis    Blood transfusion without reported diagnosis    Breast cancer (HCC) 2024   R   Chronic headaches    Diverticulosis    Hyperlipidemia    Hypertension    Hypothyroidism    IBS (irritable bowel syndrome)    Prediabetes    Recurrent UTI    Past Surgical History:  Procedure Laterality Date   BREAST BIOPSY Right 2019   BREAST BIOPSY Right 12/25/2022   US  RT BREAST BX W LOC DEV 1ST LESION IMG BX SPEC US  GUIDE 12/25/2022 [facility]   BREAST BIOPSY Right 01/01/2023   US  RT BREAST BX W LOC DEV 1ST LESION IMG BX SPEC US  GUIDE 01/01/2023 [facility]   BREAST BIOPSY  02/25/2023   MM RT RADIOACTIVE SEED LOC MAMMO GUIDE 02/25/2023 [facility]   BREAST BIOPSY  02/25/2023   MM RT RADIOACTIVE SEED EA ADD LESION LOC MAMMO GUIDE 02/25/2023 [facility]   BREAST LUMPECTOMY WITH RADIOACTIVE SEED LOCALIZATION Right 02/26/2023   Procedure: RIGHT BREAST BRACKETED SEED LUMPECTOMY;  Surgeon: Vanderbilt Ned, MD;  Location: [facility];  Service: General;  Laterality: Right;   IR  ANGIOGRAM SELECTIVE EACH ADDITIONAL VESSEL  09/28/2020   IR ANGIOGRAM SELECTIVE EACH ADDITIONAL VESSEL  09/28/2020   IR ANGIOGRAM SELECTIVE EACH ADDITIONAL VESSEL  09/28/2020   IR ANGIOGRAM SELECTIVE EACH ADDITIONAL VESSEL  09/28/2020   IR ANGIOGRAM VISCERAL SELECTIVE  09/28/2020   IR ANGIOGRAM VISCERAL SELECTIVE  09/28/2020   IR EMBO ART  VEN HEMORR LYMPH EXTRAV  INC GUIDE ROADMAPPING  09/28/2020   IR US  GUIDE VASC ACCESS RIGHT  09/28/2020   TONSILLECTOMY       ALLERGIES:  Allergies  Allergen Reactions   Bacid Cough   Timothy Grass Pollen Allergen Other (See Comments) and Cough   Wheat Cough     CURRENT MEDICATIONS:  Outpatient Encounter Medications as of 08/08/2023  Medication Sig   AMBULATORY NON FORMULARY MEDICATION Bone Up 1 tablet in the morning and 1 in the afternoon   ARMOUR THYROID  120 MG tablet TAKE 1 TABLET BY MOUTH EVERY DAY   Ascorbic Acid (VITAMIN C) 100 MG CHEW    B Complex Vitamins (VITAMIN B COMPLEX 100 IJ)    Blood Glucose Monitoring Suppl (ONETOUCH VERIO FLEX SYSTEM) DEVI USE TO TEST BLOOD SUGAR DAILY   Butalbital -APAP-Caffeine  50-300-40 MG CAPS Take 1 capsule by mouth every 6 (six) hours as needed.   CALCIUM  PO Take 1 tablet by mouth daily.   Carboxymethylcellulose Sodium (EYE DROPS OP) Apply 2 drops to eye 2 (two) times daily as needed (dry  eyes).   Cholecalciferol (VITAMIN D3) 50 MCG (2000 UT) capsule    clotrimazole-betamethasone  (LOTRISONE) cream Apply a small amount to affected area 1-2 times per day for 14 days, then stop and use as needed.   Coenzyme Q10 (COQ-10) 100 MG capsule    dexamethasone  (DECADRON ) 0.1 % ophthalmic solution SMARTSIG:2 Drop(s) In Ear(s)   Digestive Enzymes (ENZYME DIGEST) CAPS Take 2 capsules by mouth daily.   glucose blood (ONETOUCH VERIO) test strip Use as instructed   Lancets (ONETOUCH DELICA PLUS LANCET30G) MISC See admin instructions.   Magnesium 110 MG CAPS Take 2 capsules by mouth 2 (two) times daily.   olmesartan   (BENICAR ) 20 MG tablet Take 1 tablet (20 mg total) by mouth daily.   Omega-3 Fatty Acids (FISH OIL PO) Take 1 tablet by mouth 2 (two) times daily.   psyllium (METAMUCIL) 58.6 % packet Take 1 packet by mouth 2 (two) times daily.   QUERCETIN PO Take 2 tablets by mouth 3 (three) times daily.   rosuvastatin  (CRESTOR ) 10 MG tablet Take 1 tablet (10 mg total) by mouth daily.   Testosterone  1.62 % GEL    Zinc Sulfate 66 MG TABS Take 132 mg by mouth daily.   No facility-administered encounter medications on file as of 08/08/2023.     ONCOLOGIC FAMILY HISTORY:  Family History  Problem Relation Age of Onset   Hearing loss Mother    Miscarriages / Stillbirths Mother    Alcohol abuse Father    Hearing loss Father    Hypertension Father    Diabetes Brother    Cancer Maternal Grandmother        possibly had breast cancer dx. in her 30s   Diabetes Paternal Grandmother      SOCIAL HISTORY:  Social History   Socioeconomic History   Marital status: Married    Spouse name: Not on file   Number of children: 3   Years of education: Not on file   Highest education level: Master's degree (e.g., MA, MS, MEng, MEd, MSW, MBA)  Occupational History   Occupation: Artist  Tobacco Use   Smoking status: Never   Smokeless tobacco: Never  Vaping Use   Vaping status: Never Used  Substance and Sexual Activity   Alcohol use: No   Drug use: No   Sexual activity: Not Currently    Birth control/protection: None  Other Topics Concern   Not on file  Social History Narrative   1 grand      Retired runner, broadcasting/film/video.   Artist now   Social Drivers of Health   Financial Resource Strain: Medium Risk (07/10/2023)   Overall Financial Resource Strain (CARDIA)    Difficulty of Paying Living Expenses: Somewhat hard  Food Insecurity: Food Insecurity Present (07/10/2023)   Hunger Vital Sign    Worried About Running Out of Food in the Last Year: Never true    Ran Out of Food in the Last Year: Sometimes true   Transportation Needs: No Transportation Needs (07/10/2023)   PRAPARE - Administrator, Civil Service (Medical): No    Lack of Transportation (Non-Medical): No  Physical Activity: Insufficiently Active (07/10/2023)   Exercise Vital Sign    Days of Exercise per Week: 5 days    Minutes of Exercise per Session: 20 min  Stress: No Stress Concern Present (07/10/2023)   [employer] of Occupational Health - Occupational Stress Questionnaire    Feeling of Stress : Only a little  Social Connections: Socially Integrated (07/10/2023)  Social Advertising Account Executive [NHANES]    Frequency of Communication with Friends and Family: Twice a week    Frequency of Social Gatherings with Friends and Family: Twice a week    Attends Religious Services: More than 4 times per year    Active Member of [employer] or Organizations: Yes    Attends Engineer, Structural: More than 4 times per year    Marital Status: Married  Catering Manager Violence: Not At Risk (05/20/2023)   Humiliation, Afraid, Rape, and Kick questionnaire    Fear of Current or Ex-Partner: No    Emotionally Abused: No    Physically Abused: No    Sexually Abused: No     OBSERVATIONS/OBJECTIVE:  BP (!) 153/69 (BP Location: Left Arm, Patient Position: Sitting)   Pulse 81   Temp 98 F (36.7 C) (Oral)   Resp 20   Wt 210 lb 8 oz (95.5 kg)   SpO2 94%   BMI 36.13 kg/m  GENERAL: Patient is a well appearing female in no acute distress HEENT:  Sclerae anicteric.  Oropharynx clear and moist. No ulcerations or evidence of oropharyngeal candidiasis. Neck is supple.  NODES:  No cervical, supraclavicular, or axillary lymphadenopathy palpated.  BREAST EXAM: Right breast status postlumpectomy, I do not notice any swelling however there is a slight increase in warmth.  There is no tenderness or erythema noted on the breast.  Left breast is benign. LUNGS:  Clear to auscultation bilaterally.  No wheezes or rhonchi. HEART:   Regular rate and rhythm. No murmur appreciated. ABDOMEN:  Soft, nontender.  Positive, normoactive bowel sounds. No organomegaly palpated. MSK:  No focal spinal tenderness to palpation. Full range of motion bilaterally in the upper extremities. EXTREMITIES:  No peripheral edema.   SKIN:  Clear with no obvious rashes or skin changes. No nail dyscrasia. NEURO:  Nonfocal. Well oriented.  Appropriate affect.   LABORATORY DATA:  None for this visit.  DIAGNOSTIC IMAGING:  None for this visit.      ASSESSMENT AND PLAN:  Ms.. Rogers is a pleasant 73 y.o. female with Stage 1A right breast invasive ductal carcinoma, ER+/PR+/HER2-, diagnosed in June 2024, treated with lumpectomy, and subsequently declined radiation and antiestrogen therapy..  She presents to the Survivorship Clinic for our initial meeting and routine follow-up post-completion of treatment for breast cancer.    1. Stage 1A right breast cancer:  Ms. Rogers is continuing to recover from definitive treatment for breast cancer.  She will follow-up with her surgeon, Dr. Vanderbilt in February 2025.  Her mammogram is due 12/2023; orders placed today.   Today, a comprehensive survivorship care plan and treatment summary was reviewed with the patient today detailing her breast cancer diagnosis, treatment course, potential late/long-term effects of treatment, appropriate follow-up care with recommendations for the future, and patient education resources.  A copy of this summary, along with a letter will be sent to the patient's primary care provider via mail/fax/In Basket message after today's visit.    2. Bone health:  She was given education on specific activities to promote bone health.  3. Cancer screening:  Due to Monica Rogers history and her age, she should receive screening for skin cancers, colon cancer.  The information and recommendations are listed on the patient's comprehensive care plan/treatment summary and were reviewed in detail with the  patient.    4. Health maintenance and wellness promotion: Monica Rogers was encouraged to consume 5-7 servings of fruits and vegetables per day. We reviewed the  Nutrition Rainbow handout.  She was also encouraged to engage in moderate to vigorous exercise for 30 minutes per day most days of the week.  She was instructed to limit her alcohol consumption and continue to abstain from tobacco use.     5. Support services/counseling: It is not uncommon for this period of the patient's cancer care trajectory to be one of many emotions and stressors.   She was given information regarding our available services and encouraged to contact me with any questions or for help enrolling in any of our support group/programs.    Follow up instructions:    -Return to cancer center as needed -Mammogram due in 12/2023 -She is welcome to return back to the Survivorship Clinic at any time; no additional follow-up needed at this time.  -Consider referral back to survivorship as a long-term survivor for continued surveillance  The patient was provided an opportunity to ask questions and all were answered. The patient agreed with the plan and demonstrated an understanding of the instructions.   Total encounter time:40 minutes*in face-to-face visit time, chart review, lab review, care coordination, order entry, and documentation of the encounter time.    Morna Kendall, NP 08/08/23 3:43 PM Medical Oncology and Hematology [facility] [address] [place], [place] [address] Tel. [phone]    Fax. [phone]  *Total Encounter Time as defined by the Centers for Medicare and Medicaid Services includes, in addition to the face-to-face time of a patient visit (documented in the note above) non-face-to-face time: obtaining and reviewing outside history, ordering and reviewing medications, tests or procedures, care coordination (communications with other health care professionals or caregivers) and  documentation in the medical record.

## 2023-08-26 ENCOUNTER — Ambulatory Visit (INDEPENDENT_AMBULATORY_CARE_PROVIDER_SITE_OTHER): Payer: No Typology Code available for payment source | Admitting: Family Medicine

## 2023-08-26 ENCOUNTER — Ambulatory Visit: Payer: Self-pay | Admitting: Family Medicine

## 2023-08-26 ENCOUNTER — Encounter: Payer: Self-pay | Admitting: Family Medicine

## 2023-08-26 VITALS — BP 120/80 | HR 90 | Temp 97.3°F | Resp 16 | Ht 64.0 in | Wt 191.0 lb

## 2023-08-26 DIAGNOSIS — M545 Low back pain, unspecified: Secondary | ICD-10-CM

## 2023-08-26 DIAGNOSIS — R300 Dysuria: Secondary | ICD-10-CM

## 2023-08-26 DIAGNOSIS — N390 Urinary tract infection, site not specified: Secondary | ICD-10-CM | POA: Diagnosis not present

## 2023-08-26 DIAGNOSIS — E1165 Type 2 diabetes mellitus with hyperglycemia: Secondary | ICD-10-CM | POA: Diagnosis not present

## 2023-08-26 DIAGNOSIS — R110 Nausea: Secondary | ICD-10-CM

## 2023-08-26 LAB — POC URINALSYSI DIPSTICK (AUTOMATED)
Bilirubin, UA: POSITIVE
Blood, UA: NEGATIVE
Glucose, UA: POSITIVE — AB
Ketones, UA: POSITIVE
Nitrite, UA: POSITIVE
Protein, UA: POSITIVE — AB
Urobilinogen, UA: 2 U/dL — AB
pH, UA: 5 (ref 5.0–8.0)

## 2023-08-26 LAB — GLUCOSE, POCT (MANUAL RESULT ENTRY): POC Glucose: 77 mg/dL (ref 70–99)

## 2023-08-26 MED ORDER — SULFAMETHOXAZOLE-TRIMETHOPRIM 800-160 MG PO TABS: 1.0000 | ORAL_TABLET | Freq: Two times a day (BID) | ORAL | 0 refills | Status: AC

## 2023-08-26 NOTE — Progress Notes (Signed)
ACUTE VISIT Chief Complaint  Patient presents with   Urinary Tract Infection    Started Saturday evening, dysuria started Sunday. Has been using Phenazopyridine to help with the pain.    HPI: Monica Rogers is a 73 y.o. female with a PMHx significant for migraine, HTN, lDM II, hypothyroidism, breast cancer, HLD, and chronic pain, among some, who is here today complaining of urinary symptoms as described above.  Patient states she has been having pain while urinating since 1/18.  She also endorses some nausea and back pain, both started before dysuria onset.  Urinary Tract Infection  This is a new problem. The current episode started in the past 7 days. The problem has been gradually worsening. The quality of the pain is described as burning. The pain is moderate. There has been no fever. She is Not sexually active. There is No history of pyelonephritis. Associated symptoms include hesitancy and nausea. Pertinent negatives include no chills, discharge, flank pain, frequency, hematuria, sweats, urgency or vomiting.   States that she has some chronic back pain, exacerbated by prolonged standing but this back pain happens while seated. Not radiated and no saddle anesthesia.   She has been taking phenazopyridine 200 mg, she has some tabs left from old Rx.  Treated for UTI on 04/30/23.     Component Ref Range & Units (hover) 3 mo ago  MICRO NUMBER: [id number]  SPECIMEN QUALITY: Adequate  Sample Source URINE  STATUS: FINAL  ISOLATE 1: Escherichia coli Abnormal   Comment: Greater than 100,000 CFU/mL of Escherichia coli  Resulting Agency [facility]     Susceptibility   Escherichia coli    URINE CULTURE, REFLEX    AMOX/CLAVULANIC <=2 Sensitive    AMPICILLIN <=2 Sensitive    AMPICILLIN/SULBACTAM <=2 Sensitive    CEFAZOLIN <=4 Not Reportable 1    CEFEPIME <=1 Sensitive    CEFTAZIDIME <=1 Sensitive    CEFTRIAXONE <=1 Sensitive    CIPROFLOXACIN >=4 Resistant     GENTAMICIN <=1 Sensitive    IMIPENEM <=0.25 Sensitive    LEVOFLOXACIN >=8 Resistant    NITROFURANTOIN <=16 Sensitive    PIP/TAZO <=4 Sensitive    TOBRAMYCIN <=1 Sensitive    TRIMETH/SULFA <=20 Sensitive 2             Negative for abdominal pain or abdominal pain.  She mentions she has been fasting for religious reasons for about 2 weeks and will be continuing to do so until 08/29/2022. She has been drinking water, apple juice, tomato juice, and bone broth.   Her urine today was positive for glucose and ketones.  DM II on non pharmacologic treatment. She is not checking BS's at home.   Lab Results  Component Value Date   HGBA1C 6.6 (H) 07/11/2023   Review of Systems  Constitutional:  Negative for activity change, appetite change, chills and fever.  Respiratory:  Negative for cough and shortness of breath.   Cardiovascular:  Negative for chest pain.  Gastrointestinal:  Positive for nausea. Negative for vomiting.  Endocrine: Negative for polydipsia, polyphagia and polyuria.  Genitourinary:  Positive for hesitancy. Negative for flank pain, frequency, hematuria, urgency, vaginal bleeding and vaginal discharge.  Musculoskeletal:  Negative for gait problem.  Skin:  Negative for rash.  Neurological:  Negative for syncope, weakness and headaches.  Psychiatric/Behavioral:  Negative for confusion and hallucinations.   See other pertinent positives and negatives in HPI.  Current Outpatient Medications on File Prior to Visit  Medication Sig Dispense Refill  AMBULATORY NON FORMULARY MEDICATION Bone Up 1 tablet in the morning and 1 in the afternoon     ARMOUR THYROID 120 MG tablet TAKE 1 TABLET BY MOUTH EVERY DAY 90 tablet 4   Ascorbic Acid (VITAMIN C) 100 MG CHEW      B Complex Vitamins (VITAMIN B COMPLEX 100 IJ)      Blood Glucose Monitoring Suppl (ONETOUCH VERIO FLEX SYSTEM) DEVI USE TO TEST BLOOD SUGAR DAILY     Butalbital-APAP-Caffeine 50-300-40 MG CAPS Take 1 capsule by mouth every 6  (six) hours as needed. 14 capsule 0   CALCIUM PO Take 1 tablet by mouth daily.     Carboxymethylcellulose Sodium (EYE DROPS OP) Apply 2 drops to eye 2 (two) times daily as needed (dry eyes).     Cholecalciferol (VITAMIN D3) 50 MCG (2000 UT) capsule      clotrimazole-betamethasone (LOTRISONE) cream Apply a small amount to affected area 1-2 times per day for 14 days, then stop and use as needed.     Coenzyme Q10 (COQ-10) 100 MG capsule      dexamethasone (DECADRON) 0.1 % ophthalmic solution SMARTSIG:2 Drop(s) In Ear(s)     Digestive Enzymes (ENZYME DIGEST) CAPS Take 2 capsules by mouth daily.     glucose blood (ONETOUCH VERIO) test strip Use as instructed 100 each 2   Lancets (ONETOUCH DELICA PLUS LANCET30G) MISC See admin instructions.     Magnesium 110 MG CAPS Take 2 capsules by mouth 2 (two) times daily.     olmesartan (BENICAR) 20 MG tablet Take 1 tablet (20 mg total) by mouth daily. 90 tablet 3   Omega-3 Fatty Acids (FISH OIL PO) Take 1 tablet by mouth 2 (two) times daily.     psyllium (METAMUCIL) 58.6 % packet Take 1 packet by mouth 2 (two) times daily.     QUERCETIN PO Take 2 tablets by mouth 3 (three) times daily.     rosuvastatin (CRESTOR) 10 MG tablet Take 1 tablet (10 mg total) by mouth daily. 90 tablet 0   Testosterone 1.62 % GEL      Zinc Sulfate 66 MG TABS Take 132 mg by mouth daily.     No current facility-administered medications on file prior to visit.    Past Medical History:  Diagnosis Date   Allergy    Anemia    Arthritis    Blood transfusion without reported diagnosis    Breast cancer (HCC) 2024   R   Chronic headaches    Diverticulosis    Hyperlipidemia    Hypertension    Hypothyroidism    IBS (irritable bowel syndrome)    Prediabetes    Recurrent UTI    Allergies  Allergen Reactions   Bacid Cough   Timothy Grass Pollen Allergen Other (See Comments) and Cough   Wheat Cough    Social History   Socioeconomic History   Marital status: Married     Spouse name: Not on file   Number of children: 3   Years of education: Not on file   Highest education level: Master's degree (e.g., MA, MS, MEng, MEd, MSW, MBA)  Occupational History   Occupation: Artist  Tobacco Use   Smoking status: Never   Smokeless tobacco: Never  Vaping Use   Vaping status: Never Used  Substance and Sexual Activity   Alcohol use: No   Drug use: No   Sexual activity: Not Currently    Birth control/protection: None  Other Topics Concern   Not on file  Social History Narrative   1 grand      Retired Runner, broadcasting/film/video.   Artist now   Social Drivers of Health   Financial Resource Strain: Medium Risk (07/10/2023)   Overall Financial Resource Strain (CARDIA)    Difficulty of Paying Living Expenses: Somewhat hard  Food Insecurity: Food Insecurity Present (07/10/2023)   Hunger Vital Sign    Worried About Running Out of Food in the Last Year: Never true    Ran Out of Food in the Last Year: Sometimes true  Transportation Needs: No Transportation Needs (07/10/2023)   PRAPARE - Administrator, Civil Service (Medical): No    Lack of Transportation (Non-Medical): No  Physical Activity: Insufficiently Active (07/10/2023)   Exercise Vital Sign    Days of Exercise per Week: 5 days    Minutes of Exercise per Session: 20 min  Stress: No Stress Concern Present (07/10/2023)   [employer] of Occupational Health - Occupational Stress Questionnaire    Feeling of Stress : Only a little  Social Connections: Socially Integrated (07/10/2023)   Social Connection and Isolation Panel [NHANES]    Frequency of Communication with Friends and Family: Twice a week    Frequency of Social Gatherings with Friends and Family: Twice a week    Attends Religious Services: More than 4 times per year    Active Member of [employer] or Organizations: Yes    Attends Banker Meetings: More than 4 times per year    Marital Status: Married    Vitals:   08/26/23 1542  BP: 120/80   Pulse: 90  Resp: 16  Temp: (!) 97.3 F (36.3 C)  SpO2: 96%   Body mass index is 32.79 kg/m.  Physical Exam Vitals and nursing note reviewed.  Constitutional:      General: She is not in acute distress.    Appearance: She is well-developed.  HENT:     Head: Normocephalic and atraumatic.     Mouth/Throat:     Mouth: Mucous membranes are dry.     Pharynx: Oropharynx is clear. Uvula midline.  Eyes:     Conjunctiva/sclera: Conjunctivae normal.  Cardiovascular:     Rate and Rhythm: Normal rate and regular rhythm.  Pulmonary:     Effort: Pulmonary effort is normal. No respiratory distress.     Breath sounds: Normal breath sounds.  Abdominal:     Palpations: Abdomen is soft. There is no mass.     Tenderness: There is no abdominal tenderness. There is no right CVA tenderness or left CVA tenderness.  Musculoskeletal:     Lumbar back: No tenderness. Negative right straight leg raise test and negative left straight leg raise test.     Right lower leg: 1+ Pitting Edema present.     Left lower leg: 1+ Pitting Edema present.  Skin:    General: Skin is warm.     Findings: No erythema.  Neurological:     Mental Status: She is alert and oriented to person, place, and time.  Psychiatric:        Mood and Affect: Mood and affect normal.   ASSESSMENT AND PLAN:  Ms. Casida was seen today for possible UTI.   Bilateral low back pain without sciatica, unspecified chronicity Could be musculoskeletal. Pain was not elicited on examination. Monitor for new symptoms. I do not think imaging is needed today.  Dysuria Urine dipstick done today showed 3+ leukocytes and pos nitrite. Urine sent for Cx. Continue phenazopyridine 200 mg tid prn  for 2 more days if needed.  -     POCT Urinalysis Dipstick (Automated) -     Urine Culture; Future  Urinary tract infection without hematuria, site unspecified Empiric abx treatment started today based on sensitivity of Ucx done in 04/2023, Bactrim DS bid  x 5 d recommended. We will tailor treatment according to Ucx results and susceptibility report. Clearly instructed about warning signs. Continue adequate hydration. F/U with PCP if symptoms persist.  -     Sulfamethoxazole-Trimethoprim; Take 1 tablet by mouth 2 (two) times daily for 5 days.  Dispense: 10 tablet; Refill: 0  Nausea without vomiting She has had this problem since before dysuria started. Could be caused by prolonged fasting, she is planning on completing 21 days. Abx treatment could aggravate problem. Avoid taking abx on empty stomach.  Type 2 diabetes mellitus with hyperglycemia, without long-term current use of insulin (HCC) Urine dipstick positive for ketones and glucose, glucose today 77. Advised caution with prolonged fasting due to risk of hypoglycemia.  -     POCT glucose (manual entry)  Return if symptoms worsen or fail to improve.  I, Rolla Etienne Wierda, acting as a scribe for Oddie Bottger [place], MD., have documented all relevant documentation on the behalf of Diasia Henken [place], MD, as directed by  Viraj Liby [place], MD while in the presence of Timmy Cleverly [place], MD.   I, Tully Burgo [place], MD, have reviewed all documentation for this visit. The documentation on 08/26/23 for the exam, diagnosis, procedures, and orders are all accurate and complete.  Raquelle Pietro G. [place], MD  [facility]. [place] office.

## 2023-08-26 NOTE — Patient Instructions (Addendum)
A few things to remember from today's visit:  Dysuria - Plan: POCT Urinalysis Dipstick (Automated), Culture, Urine  Urinary tract infection without hematuria, site unspecified  Bilateral low back pain without sciatica, unspecified chronicity  Nausea without vomiting  Bactrim to start taking for possible UTI. Back pain could be muscular. Fasting could be aggravating or causing nausea.  Adequate fluid intake, avoid holding urine for long hours, and over the counter Vit C OR cranberry capsules might help.  Today we will treat empirically with antibiotic, which we might need to change when urine culture comes back depending of bacteria susceptibility.  Seek immediate medical attention if severe abdominal pain, vomiting, fever/chills, or worsening symptoms. F/U if symptomatic are not any better after 2-3 days of antibiotic treatment.  Do not use My Chart to request refills or for acute issues that need immediate attention. If you send a my chart message, it may take a few days to be addressed, specially if I am not in the office.  Please be sure medication list is accurate. If a new problem present, please set up appointment sooner than planned today.

## 2023-08-26 NOTE — Telephone Encounter (Signed)
See message below and advise.

## 2023-08-26 NOTE — Telephone Encounter (Addendum)
Copied from CRM [id number]. Topic: Clinical - Red Word Triage >> Aug 26, 2023  9:27 AM Leavy Cella D wrote: Red Word that prompted transfer to Nurse Triage:UTI for a few days . Patient is experiencing severe pain  Chief Complaint: UTI Symptoms: Painful urination, frequency, urgency, lower back discomfort Frequency: Ongoing since Saturday night Pertinent Negatives: Patient denies fever Disposition: [] ED /[] Urgent Care (no appt availability in office) / [x] Appointment(In office/virtual)/ []  [facility]/ [] Home Care/ [x] Refused Recommended Disposition /[] [facility]/ []  Follow-up with PCP Additional Notes: Patient reported possible UTI symptoms that started Saturday night. She reported painful urination, frequency, urgency, and lower back discomfort. This RN offered to schedule an appointment with the provider. Patient declined. She stated that she usually just comes in and leaves a urine sample and does not have to see a provider. Patient is asking to just provide a sample without having a provider visit. Informed patient that a message will be sent and the office will follow up with her.   Reason for Disposition  Urinating more frequently than usual (i.e., frequency)  Answer Assessment - Initial Assessment Questions 1. SYMPTOM: "What's the main symptom you're concerned about?" (e.g., frequency, incontinence)     Painful urination, frequency   2. ONSET: "When did the symptoms start?"     Saturday night  3. PAIN: "Is there any pain?"      Yes  4. CAUSE: "What do you think is causing the symptoms?"     UTI  5. OTHER SYMPTOMS: "Do you have any other symptoms?" (e.g., blood in urine, fever, flank pain, pain with urination)     Discomfort in lower back  Protocols used: Urinary Symptoms-A-AH

## 2023-08-27 ENCOUNTER — Ambulatory Visit: Payer: No Typology Code available for payment source | Admitting: Family Medicine

## 2023-08-27 LAB — URINE CULTURE
MICRO NUMBER:: 15977025
Result:: NO GROWTH
SPECIMEN QUALITY:: ADEQUATE

## 2023-08-30 ENCOUNTER — Encounter: Payer: Self-pay | Admitting: Family Medicine

## 2023-09-04 DIAGNOSIS — M545 Low back pain, unspecified: Secondary | ICD-10-CM | POA: Diagnosis not present

## 2023-09-09 ENCOUNTER — Emergency Department (HOSPITAL_COMMUNITY)
Admission: EM | Admit: 2023-09-09 | Discharge: 2023-09-09 | Disposition: A | Discharge status: Home / Self Care | Payer: No Typology Code available for payment source | Attending: Emergency Medicine | Admitting: Emergency Medicine

## 2023-09-09 ENCOUNTER — Emergency Department (HOSPITAL_COMMUNITY): Payer: No Typology Code available for payment source

## 2023-09-09 ENCOUNTER — Other Ambulatory Visit: Payer: Self-pay

## 2023-09-09 ENCOUNTER — Encounter (HOSPITAL_COMMUNITY): Payer: Self-pay | Admitting: Emergency Medicine

## 2023-09-09 DIAGNOSIS — D72829 Elevated white blood cell count, unspecified: Secondary | ICD-10-CM | POA: Insufficient documentation

## 2023-09-09 DIAGNOSIS — K8020 Calculus of gallbladder without cholecystitis without obstruction: Secondary | ICD-10-CM | POA: Diagnosis not present

## 2023-09-09 DIAGNOSIS — K5901 Slow transit constipation: Secondary | ICD-10-CM | POA: Insufficient documentation

## 2023-09-09 DIAGNOSIS — K561 Intussusception: Secondary | ICD-10-CM | POA: Diagnosis not present

## 2023-09-09 DIAGNOSIS — K5730 Diverticulosis of large intestine without perforation or abscess without bleeding: Secondary | ICD-10-CM | POA: Diagnosis not present

## 2023-09-09 DIAGNOSIS — M545 Low back pain, unspecified: Secondary | ICD-10-CM | POA: Diagnosis not present

## 2023-09-09 DIAGNOSIS — K429 Umbilical hernia without obstruction or gangrene: Secondary | ICD-10-CM | POA: Diagnosis not present

## 2023-09-09 DIAGNOSIS — R109 Unspecified abdominal pain: Secondary | ICD-10-CM | POA: Diagnosis present

## 2023-09-09 LAB — CBC WITH DIFFERENTIAL/PLATELET
Abs Immature Granulocytes: 0.1 10*3/uL — ABNORMAL HIGH (ref 0.00–0.07)
Basophils Absolute: 0.1 10*3/uL (ref 0.0–0.1)
Basophils Relative: 0 %
Eosinophils Absolute: 0.1 10*3/uL (ref 0.0–0.5)
Eosinophils Relative: 0 %
HCT: 48.6 % — ABNORMAL HIGH (ref 36.0–46.0)
Hemoglobin: 15.7 g/dL — ABNORMAL HIGH (ref 12.0–15.0)
Immature Granulocytes: 1 %
Lymphocytes Relative: 6 %
Lymphs Abs: 1.2 10*3/uL (ref 0.7–4.0)
MCH: 31.4 pg (ref 26.0–34.0)
MCHC: 32.3 g/dL (ref 30.0–36.0)
MCV: 97.2 fL (ref 80.0–100.0)
Monocytes Absolute: 1.2 10*3/uL — ABNORMAL HIGH (ref 0.1–1.0)
Monocytes Relative: 6 %
Neutro Abs: 16.4 10*3/uL — ABNORMAL HIGH (ref 1.7–7.7)
Neutrophils Relative %: 87 %
Platelets: 351 10*3/uL (ref 150–400)
RBC: 5 MIL/uL (ref 3.87–5.11)
RDW: 14 % (ref 11.5–15.5)
WBC: 19 10*3/uL — ABNORMAL HIGH (ref 4.0–10.5)
nRBC: 0 % (ref 0.0–0.2)

## 2023-09-09 LAB — COMPREHENSIVE METABOLIC PANEL
ALT: 70 U/L — ABNORMAL HIGH (ref 0–44)
AST: 42 U/L — ABNORMAL HIGH (ref 15–41)
Albumin: 3.7 g/dL (ref 3.5–5.0)
Alkaline Phosphatase: 79 U/L (ref 38–126)
Anion gap: 12 (ref 5–15)
BUN: 27 mg/dL — ABNORMAL HIGH (ref 8–23)
CO2: 25 mmol/L (ref 22–32)
Calcium: 9.7 mg/dL (ref 8.9–10.3)
Chloride: 98 mmol/L (ref 98–111)
Creatinine, Ser: 0.71 mg/dL (ref 0.44–1.00)
GFR, Estimated: >60 mL/min (ref >60)
Glucose, Bld: 188 mg/dL — ABNORMAL HIGH (ref 70–99)
Potassium: 4.5 mmol/L (ref 3.5–5.1)
Sodium: 135 mmol/L (ref 135–145)
Total Bilirubin: 0.6 mg/dL (ref 0.0–1.2)
Total Protein: 6.7 g/dL (ref 6.5–8.1)

## 2023-09-09 LAB — URINALYSIS, ROUTINE W REFLEX MICROSCOPIC
Bilirubin Urine: NEGATIVE
Glucose, UA: NEGATIVE mg/dL
Hgb urine dipstick: NEGATIVE
Ketones, ur: NEGATIVE mg/dL
Leukocytes,Ua: NEGATIVE
Nitrite: NEGATIVE
Protein, ur: NEGATIVE mg/dL
Specific Gravity, Urine: 1.014 (ref 1.005–1.030)
pH: 6 (ref 5.0–8.0)

## 2023-09-09 LAB — CBG MONITORING, ED: Glucose-Capillary: 184 mg/dL — ABNORMAL HIGH (ref 70–99)

## 2023-09-09 LAB — LIPASE, BLOOD: Lipase: 25 U/L (ref 11–51)

## 2023-09-09 MED ORDER — ONDANSETRON HCL 4 MG PO TABS: 4.0000 mg | ORAL_TABLET | Freq: Four times a day (QID) | ORAL | 0 refills | Status: DC

## 2023-09-09 MED ORDER — MORPHINE SULFATE (PF) 4 MG/ML IV SOLN
4.0000 mg | Freq: Once | INTRAVENOUS | Status: AC
  Administered 2023-09-09: 4 mg via INTRAVENOUS
  Filled 2023-09-09: qty 1

## 2023-09-09 MED ORDER — ONDANSETRON HCL 4 MG/2ML IJ SOLN
4.0000 mg | Freq: Once | INTRAMUSCULAR | Status: AC
  Administered 2023-09-09: 4 mg via INTRAVENOUS
  Filled 2023-09-09: qty 2

## 2023-09-09 MED ORDER — DOCUSATE SODIUM 250 MG PO CAPS: 250.0000 mg | ORAL_CAPSULE | Freq: Every day | ORAL | 0 refills | Status: AC

## 2023-09-09 MED ORDER — GOLYTELY 236 G PO SOLR: 4000.0000 mL | Freq: Once | ORAL | 0 refills | Status: AC

## 2023-09-09 MED ORDER — POLYETHYLENE GLYCOL 3350 17 GM/SCOOP PO POWD: 1.0000 | Freq: Once | ORAL | 0 refills | Status: AC

## 2023-09-09 MED ORDER — IOHEXOL 300 MG/ML  SOLN
100.0000 mL | Freq: Once | INTRAMUSCULAR | Status: AC | PRN
  Administered 2023-09-09: 100 mL via INTRAVENOUS

## 2023-09-09 MED ORDER — FLEET ENEMA RE ENEM: 1.0000 | ENEMA | Freq: Once | RECTAL | Status: DC

## 2023-09-09 MED ORDER — POLYETHYLENE GLYCOL 3350 17 G PO PACK
17.0000 g | PACK | Freq: Once | ORAL | Status: AC
Start: 2023-09-09 — End: 2023-09-09
  Administered 2023-09-09: 17 g via ORAL
  Filled 2023-09-09: qty 1

## 2023-09-09 MED ORDER — DOCUSATE SODIUM 100 MG PO CAPS
100.0000 mg | ORAL_CAPSULE | Freq: Once | ORAL | Status: AC
Start: 2023-09-09 — End: 2023-09-09
  Administered 2023-09-09: 100 mg via ORAL
  Filled 2023-09-09: qty 1

## 2023-09-09 MED ORDER — ONDANSETRON 4 MG PO TBDP
4.0000 mg | ORAL_TABLET | Freq: Once | ORAL | Status: DC
  Filled 2023-09-09: qty 1

## 2023-09-09 NOTE — ED Triage Notes (Signed)
Patient c/o lower back pain x 1 week. Patient report she hurt her back when she picked up something heavy last week. Patient report she was prescribed steroids and muscle relaxer.  Patient report worsening lower back pain today. Patient report abdominal pain and feels so constipated. Patient report nausea , denies vomitting.

## 2023-09-09 NOTE — ED Provider Triage Note (Signed)
Emergency Medicine Provider Triage Evaluation Note  DOMINIC RHOME , a 73 y.o. female  was evaluated in triage.  Pt complains of back pain, abdominal pain.  Patient reports that she initially had a mid back strain on the right side that was managed with muscle relaxers and steroids.  States that she then felt that the pain worsening to the lower back.  Since then, she also that she has had some worsening constipation left lower quadrant abdominal pain.  Prior history of diverticulitis.  Review of Systems  Positive: As above Negative: As above  Physical Exam  BP (!) 165/111   Pulse 98   Temp 98.3 F (36.8 C)   Resp 18   Ht 5\' 4"  (1.626 m)   Wt 86.6 kg   SpO2 91%   BMI 32.77 kg/m  Gen:   Awake, no distress   Resp:  Normal effort  MSK:   No significant midline lumbar spine tenderness, no paraspinal tenderness Other:  Left lower quadrant pain  Medical Decision Making  Medically screening exam initiated at 3:20 PM.  Appropriate orders placed.  KEELAN POMERLEAU was informed that the remainder of the evaluation will be completed by another provider, this initial triage assessment does not replace that evaluation, and the importance of remaining in the ED until their evaluation is complete.     Smitty Knudsen, PA-C 09/09/23 [phone]

## 2023-09-09 NOTE — ED Provider Notes (Signed)
[place] [facility]ERGENCY DEPARTMENT AT [facility] Provider Note   CSN: [id number] Arrival date & time: 09/09/23  1413     History  Chief Complaint  Patient presents with   Back Pain   Abdominal Pain    Monica Rogers is a 73 y.o. female.  73 year old female here today for abdominal pain, constipation over the last 1 day.  Patient currently taking prednisone for back pain after she strained her back 1 week ago.   Back Pain Associated symptoms: abdominal pain   Abdominal Pain      Home Medications Prior to Admission medications   Medication Sig Start Date End Date Taking? Authorizing Provider  docusate sodium (COLACE) 250 MG capsule Take 1 capsule (250 mg total) by mouth daily. 09/09/23  Yes Anders Simmonds T, DO  ondansetron (ZOFRAN) 4 MG tablet Take 1 tablet (4 mg total) by mouth every 6 (six) hours. 09/09/23  Yes Anders Simmonds T, DO  polyethylene glycol (GOLYTELY) 236 g solution Take 4,000 mLs by mouth once for 1 dose. 09/09/23 09/09/23 Yes Anders Simmonds T, DO  polyethylene glycol powder (MIRALAX) 17 GM/SCOOP powder Take 255 g by mouth once for 1 dose. 09/09/23 09/09/23 Yes Arletha Pili, DO  AMBULATORY NON FORMULARY MEDICATION Bone Up 1 tablet in the morning and 1 in the afternoon    [provider]  ARMOUR THYROID 120 MG tablet TAKE 1 TABLET BY MOUTH EVERY DAY 02/15/23   Jeani Sow, MD  Ascorbic Acid (VITAMIN C) 100 MG CHEW     [provider]  B Complex Vitamins (VITAMIN B COMPLEX 100 IJ)     [provider]  Blood Glucose Monitoring Suppl (ONETOUCH VERIO FLEX SYSTEM) DEVI USE TO TEST BLOOD SUGAR DAILY    [provider]  Butalbital-APAP-Caffeine 50-300-40 MG CAPS Take 1 capsule by mouth every 6 (six) hours as needed. 07/11/23   Jeani Sow, MD  CALCIUM PO Take 1 tablet by mouth daily.    [provider]  Carboxymethylcellulose Sodium (EYE DROPS OP) Apply 2 drops to eye 2 (two) times daily as needed (dry eyes).     [provider]  Cholecalciferol (VITAMIN D3) 50 MCG (2000 UT) capsule     [provider]  clotrimazole-betamethasone (LOTRISONE) cream Apply a small amount to affected area 1-2 times per day for 14 days, then stop and use as needed. 08/21/22   [provider]  Coenzyme Q10 (COQ-10) 100 MG capsule     [provider]  dexamethasone (DECADRON) 0.1 % ophthalmic solution SMARTSIG:2 Drop(s) In Ear(s) 02/12/22   [provider]  Digestive Enzymes (ENZYME DIGEST) CAPS Take 2 capsules by mouth daily.    [provider]  glucose blood (ONETOUCH VERIO) test strip Use as instructed 08/29/22   Jeani Sow, MD  Lancets [facility] DELICA PLUS [place]) MISC See admin instructions.    [provider]  Magnesium 110 MG CAPS Take 2 capsules by mouth 2 (two) times daily.    [provider]  olmesartan (BENICAR) 20 MG tablet Take 1 tablet (20 mg total) by mouth daily. 04/04/23   Jeani Sow, MD  Omega-3 Fatty Acids (FISH OIL PO) Take 1 tablet by mouth 2 (two) times daily.    [provider]  psyllium (METAMUCIL) 58.6 % packet Take 1 packet by mouth 2 (two) times daily.    [provider]  QUERCETIN PO Take 2 tablets by mouth 3 (three) times daily.    [provider]  rosuvastatin (CRESTOR) 10 MG tablet Take 1 tablet (10 mg total) by mouth daily. 07/12/23   Jeani Sow, MD  Testosterone 1.62 % GEL     [provider]  Zinc Sulfate 66 MG TABS Take 132 mg by mouth daily.    [provider]      Allergies    Bacid, Timothy grass pollen allergen, and Wheat    Review of Systems   Review of Systems  Gastrointestinal:  Positive for abdominal pain.  Musculoskeletal:  Positive for back pain.    Physical Exam Updated Vital Signs BP (!) 142/85   Pulse 78   Temp 98.3 F (36.8 C)   Resp 17   Ht 5\' 4"  (1.626 m)   Wt 86.6 kg   SpO2 91%   BMI 32.77 kg/m  Physical Exam Vitals reviewed.   Abdominal:     General: Abdomen is flat. Bowel sounds are normal. There is distension.     Palpations: Abdomen is soft.     Tenderness: There is no abdominal tenderness.     ED Results / Procedures / Treatments   Labs (all labs ordered are listed, but only abnormal results are displayed) Labs Reviewed  CBC WITH DIFFERENTIAL/PLATELET - Abnormal; Notable for the following components:      Result Value   WBC 19.0 (*)    Hemoglobin 15.7 (*)    HCT 48.6 (*)    Neutro Abs 16.4 (*)    Monocytes Absolute 1.2 (*)    Abs Immature Granulocytes 0.10 (*)    All other components within normal limits  COMPREHENSIVE METABOLIC PANEL - Abnormal; Notable for the following components:   Glucose, Bld 188 (*)    BUN 27 (*)    AST 42 (*)    ALT 70 (*)    All other components within normal limits  CBG MONITORING, ED - Abnormal; Notable for the following components:   Glucose-Capillary 184 (*)    All other components within normal limits  LIPASE, BLOOD  URINALYSIS, ROUTINE W REFLEX MICROSCOPIC    EKG None  Radiology CT ABDOMEN PELVIS W CONTRAST Result Date: 09/09/2023 CLINICAL DATA:  Left lower quadrant pain EXAM: CT ABDOMEN AND PELVIS WITH CONTRAST TECHNIQUE: Multidetector CT imaging of the abdomen and pelvis was performed using the standard protocol following bolus administration of intravenous contrast. RADIATION DOSE REDUCTION: This exam was performed according to the departmental dose-optimization program which includes automated exposure control, adjustment of the mA and/or kV according to patient size and/or use of iterative reconstruction technique. CONTRAST:  OMNIPAQUE IOHEXOL 300 MG/ML  SOLN COMPARISON:  CT 09/28/2020 FINDINGS: Lower chest: Lung bases demonstrate no acute airspace disease. Mild cardiomegaly. Mitral calcifications. Hepatobiliary: Gallstones.  No biliary dilatation Pancreas: Unremarkable. No pancreatic ductal dilatation or surrounding inflammatory changes. Spleen: Normal  in size without focal abnormality. Adrenals/Urinary Tract: Adrenal glands are normal. Kidneys show no hydronephrosis. The bladder is unremarkable. Stomach/Bowel: The stomach is nonenlarged. Diverticular disease of the colon without acute wall thickening. Negative appendix. Some fluid-filled borderline to mildly distended small bowel in the left mid and upper quadrant. These are associated with a short segment left lower quadrant enteroenteric intussusception, coronal series 8, image 60 and axial series 2 image 48. The bowel distal to the intussusception is decompressed suggesting a degree of obstruction. There may be some wall thickening of the left mid quadrant small bowel upstream to the intussusception. Vascular/Lymphatic: Moderate aortic atherosclerosis. No aneurysm. No suspicious lymph nodes Reproductive: Uterus and bilateral  adnexa are unremarkable. Other: Negative for pelvic effusion or free air. Small fat containing umbilical hernia. Musculoskeletal: Age indeterminate mild superior endplate deformity at L2. Trace anterolisthesis L3 on L4 and L4 on L5. IMPRESSION: 1. Short segment left lower quadrant enteric intussusception. There is some fluid-filled borderline to mildly distended small bowel in the left mid and upper quadrant upstream to the intussusception, suggesting that there may be a degree of obstruction associated with the intussusception. Elsewhere fluid-filled nondilated small bowel and questionable mild wall thickening of jejunal loops, correlate for any history of enteritis. 2. Diverticular disease of the colon without acute wall thickening. 3. Gallstones. 4. Age indeterminate mild superior endplate deformity at L2. 5. Aortic atherosclerosis. Aortic Atherosclerosis (ICD10-I70.0). Electronically Signed   By: Jasmine Pang M.D.   On: 09/09/2023 18:53    Procedures Procedures    Medications Ordered in ED Medications  ondansetron (ZOFRAN-ODT) disintegrating tablet 4 mg (4 mg Oral Patient  Refused/Not Given 09/09/23 2131)  iohexol (OMNIPAQUE) 300 MG/ML solution 100 mL (100 mLs Intravenous Contrast Given 09/09/23 1714)  morphine (PF) 4 MG/ML injection 4 mg (4 mg Intravenous Given 09/09/23 2213)  ondansetron (ZOFRAN) injection 4 mg (4 mg Intravenous Given 09/09/23 2213)  polyethylene glycol (MIRALAX / GLYCOLAX) packet 17 g (17 g Oral Given 09/09/23 2242)  docusate sodium (COLACE) capsule 100 mg (100 mg Oral Given 09/09/23 2242)    ED Course/ Medical Decision Making/ A&P                                 Medical Decision Making 73 year old female here today with abdominal pain.  Differential diagnoses include bowel obstruction, constipation, less likely acute intra-abdominal infection.  Plan # patient with labs and imaging done at triage.  Labs show a leukocytosis which is consistent with someone who is recently started on prednisone.  Her CT imaging was concerning for possible intussusception causing obstruction.  Did discuss these findings with the on-call general surgeon, Dr. Hillery Hunter.  We discussed the patient, and he reviewed the imaging.  Believe the patient's symptoms are likely constipation, and the imaging was transient.  Recommended p.o. trial and the patient with laxatives, and discharge if she was able to.  Patient was able to tolerate laxatives.  Will discharge, have her follow-up with her PCP.  Return precautions discussed at bedside.  Risk OTC drugs. Prescription drug management.           Final Clinical Impression(s) / ED Diagnoses Final diagnoses:  Slow transit constipation    Rx / DC Orders ED Discharge Orders          Ordered    polyethylene glycol powder (MIRALAX) 17 GM/SCOOP powder   Once        09/09/23 2251    docusate sodium (COLACE) 250 MG capsule  Daily        09/09/23 2251    polyethylene glycol (GOLYTELY) 236 g solution   Once        09/09/23 2251    ondansetron (ZOFRAN) 4 MG tablet  Every 6 hours        09/09/23 2252               Anders Simmonds T, DO 09/09/23 2252

## 2023-09-09 NOTE — Discharge Instructions (Addendum)
Based on your imaging today, and the discussion with the general surgeon, believe that your symptoms are related to constipation.  What I would like you do is take 2 scoops of MiraLAX with plenty of water, and Colace.  You may continue to take Colace each day, and 2 scoops of MiraLAX with plenty of water until you have a bowel movement.  If after 2 days, you have not had a bowel movement, I have sent your prescription for the colonoscopy prep GoLytely.  You can drink as much without as you are able to help facilitate a bowel movement.  If your abdominal pain worsens, or you are unable to eat or drink, please return to the emergency room.

## 2023-09-10 ENCOUNTER — Telehealth: Payer: Self-pay | Admitting: Nurse Practitioner

## 2023-09-10 NOTE — Telephone Encounter (Signed)
Inbound call from patient and patient's daughter stating patient was at the ED yesterday 2.3 due to constipation. Requesting a urgent call back to discuss further. Please advise, thank you.

## 2023-09-11 ENCOUNTER — Ambulatory Visit: Payer: No Typology Code available for payment source | Admitting: Family Medicine

## 2023-09-11 ENCOUNTER — Encounter: Payer: Self-pay | Admitting: Family Medicine

## 2023-09-11 VITALS — BP 136/81 | HR 78 | Temp 97.5°F | Resp 18 | Ht 64.0 in | Wt 192.4 lb

## 2023-09-11 DIAGNOSIS — R1030 Lower abdominal pain, unspecified: Secondary | ICD-10-CM

## 2023-09-11 MED ORDER — ONDANSETRON 4 MG PO TBDP: 4.0000 mg | ORAL_TABLET | Freq: Three times a day (TID) | ORAL | 1 refills | Status: DC | PRN

## 2023-09-11 NOTE — Progress Notes (Signed)
 Subjective:     Patient ID: Monica Rogers, female    DOB: Jul 13, 1951, 73 y.o.   MRN: [id number]  Chief Complaint  Patient presents with   Follow-up    ER follow-up on 09/09/23 for abdominal pain and back pain Still having pain    HPI Discussed the use of AI scribe software for clinical note transcription with the patient, who gave verbal consent to proceed.  History of Present Illness   Monica Rogers is a 73 year old female with diverticulitis who presents with abdominal pain and nausea. She is accompanied by her husband, Garrel.  For nearly two weeks, she has experienced constant abdominal pain located in the lower abdomen, which worsens in the evenings. She describes abdominal swelling, bloating, and nausea after eating. She has had difficulty passing gas and initially experienced constipation, which transitioned to diarrhea after using Miralax  and stool softeners. No fever or chills are present.  Two days ago, she visited the emergency room due to severe symptoms. A CT scan suggested possible intussusception, but surgery was not deemed necessary. She was advised to manage her symptoms with over-the-counter medications. Gallstones were identified, which may be contributing to her nausea. She has been taking Zofran  for nausea every six hours, but continues to experience significant nausea. There is no indication of acute cholecystitis or gallbladder inflammation, and she is not experiencing typical gallstone-related pain.  She has a history of arthritis and recently experienced back pain after pulling her back while reaching for her purse. The pain initially affected her mid-back and has since moved to her lower back, requiring the use of naproxen and prednisone for relief. She reports that the prednisone may be contributing to her nausea.  Her past medical history includes diverticulitis, high blood sugar, high blood pressure, arthritis, and fatty liver. She attempted to manage her high  blood sugar and blood pressure through fasting. Her symptoms began after a three-week water and juice fast, which she believes may have led to a bowel obstruction. A high white blood cell count was attributed to her recent use of prednisone.       Health Maintenance Due  Topic Date Due   DTaP/Tdap/Td (1 - Tdap) Never done   OPHTHALMOLOGY EXAM  08/31/2023   Colonoscopy  12/13/2023    Past Medical History:  Diagnosis Date   Allergy    Anemia    Arthritis    Blood transfusion without reported diagnosis    Breast cancer (HCC) 2024   R   Chronic headaches    Diverticulosis    Hyperlipidemia    Hypertension    Hypothyroidism    IBS (irritable bowel syndrome)    Prediabetes    Recurrent UTI     Past Surgical History:  Procedure Laterality Date   BREAST BIOPSY Right 2019   BREAST BIOPSY Right 12/25/2022   US  RT BREAST BX W LOC DEV 1ST LESION IMG BX SPEC US  GUIDE 12/25/2022 [facility]   BREAST BIOPSY Right 01/01/2023   US  RT BREAST BX W LOC DEV 1ST LESION IMG BX SPEC US  GUIDE 01/01/2023 [facility]   BREAST BIOPSY  02/25/2023   MM RT RADIOACTIVE SEED LOC MAMMO GUIDE 02/25/2023 [facility]   BREAST BIOPSY  02/25/2023   MM RT RADIOACTIVE SEED EA ADD LESION LOC MAMMO GUIDE 02/25/2023 [facility]   BREAST LUMPECTOMY WITH RADIOACTIVE SEED LOCALIZATION Right 02/26/2023   Procedure: RIGHT BREAST BRACKETED SEED LUMPECTOMY;  Surgeon: Vanderbilt Ned, MD;  Location: [facility];  Service: General;  Laterality: Right;   IR ANGIOGRAM SELECTIVE EACH ADDITIONAL VESSEL  09/28/2020   IR ANGIOGRAM SELECTIVE EACH ADDITIONAL VESSEL  09/28/2020   IR ANGIOGRAM SELECTIVE EACH ADDITIONAL VESSEL  09/28/2020   IR ANGIOGRAM SELECTIVE EACH ADDITIONAL VESSEL  09/28/2020   IR ANGIOGRAM VISCERAL SELECTIVE  09/28/2020   IR ANGIOGRAM VISCERAL SELECTIVE  09/28/2020   IR EMBO ART  VEN HEMORR LYMPH EXTRAV  INC GUIDE ROADMAPPING  09/28/2020   IR US  GUIDE VASC ACCESS RIGHT   09/28/2020   TONSILLECTOMY       Current Outpatient Medications:    AMBULATORY NON FORMULARY MEDICATION, Bone Up 1 tablet in the morning and 1 in the afternoon, Disp: , Rfl:    ARMOUR THYROID  120 MG tablet, TAKE 1 TABLET BY MOUTH EVERY DAY, Disp: 90 tablet, Rfl: 4   Ascorbic Acid (VITAMIN C) 100 MG CHEW, , Disp: , Rfl:    B Complex Vitamins (VITAMIN B COMPLEX 100 IJ), , Disp: , Rfl:    Blood Glucose Monitoring Suppl (ONETOUCH VERIO FLEX SYSTEM) DEVI, USE TO TEST BLOOD SUGAR DAILY, Disp: , Rfl:    Butalbital -APAP-Caffeine  50-300-40 MG CAPS, Take 1 capsule by mouth every 6 (six) hours as needed., Disp: 14 capsule, Rfl: 0   CALCIUM  PO, Take 1 tablet by mouth daily., Disp: , Rfl:    Carboxymethylcellulose Sodium (EYE DROPS OP), Apply 2 drops to eye 2 (two) times daily as needed (dry eyes)., Disp: , Rfl:    Cholecalciferol (VITAMIN D3) 50 MCG (2000 UT) capsule, , Disp: , Rfl:    Clobetasol Propionate 0.05 % shampoo, WASH SCALP 2X WEEKLY. APPLY TO SCALP, LET SIT 10 MINUTES AND RINSE, Disp: , Rfl:    clotrimazole-betamethasone  (LOTRISONE) cream, Apply a small amount to affected area 1-2 times per day for 14 days, then stop and use as needed., Disp: , Rfl:    Coenzyme Q10 (COQ-10) 100 MG capsule, , Disp: , Rfl:    dexamethasone  (DECADRON ) 0.1 % ophthalmic solution, SMARTSIG:2 Drop(s) In Ear(s), Disp: , Rfl:    Digestive Enzymes (ENZYME DIGEST) CAPS, Take 2 capsules by mouth daily., Disp: , Rfl:    docusate sodium  (COLACE) 250 MG capsule, Take 1 capsule (250 mg total) by mouth daily. (Patient taking differently: Take 100 mg by mouth daily.), Disp: 10 capsule, Rfl: 0   glucose blood (ONETOUCH VERIO) test strip, Use as instructed, Disp: 100 each, Rfl: 2   Lancets (ONETOUCH DELICA PLUS LANCET30G) MISC, See admin instructions., Disp: , Rfl:    Magnesium 110 MG CAPS, Take 2 capsules by mouth 2 (two) times daily., Disp: , Rfl:    methocarbamol (ROBAXIN) 500 MG tablet, PLEASE SEE ATTACHED FOR DETAILED  DIRECTIONS, Disp: , Rfl:    naproxen (NAPROSYN) 500 MG tablet, Take 500 mg by mouth 2 (two) times daily with a meal., Disp: , Rfl:    olmesartan  (BENICAR ) 20 MG tablet, Take 1 tablet (20 mg total) by mouth daily., Disp: 90 tablet, Rfl: 3   Omega-3 Fatty Acids (FISH OIL PO), Take 1 tablet by mouth 2 (two) times daily., Disp: , Rfl:    Polyethylene Glycol 3350  (MIRALAX  PO), Take by mouth as needed., Disp: , Rfl:    predniSONE (DELTASONE) 10 MG tablet, TAKE 6 TABLETS FOR 2 DAYS THEN 5 ,4,3,2,AND 1 FOR 2 DAYS EACH, Disp: , Rfl:    psyllium (METAMUCIL) 58.6 % packet, Take 1 packet by mouth 2 (two) times daily., Disp: , Rfl:    QUERCETIN PO, Take 2 tablets  by mouth 3 (three) times daily., Disp: , Rfl:    rosuvastatin  (CRESTOR ) 10 MG tablet, Take 1 tablet (10 mg total) by mouth daily., Disp: 90 tablet, Rfl: 0   Testosterone  1.62 % GEL, , Disp: , Rfl:    UNABLE TO FIND, Take 4 capsules by mouth daily. Med Name: ZYFLAMEND HERBAL PAIN RELIEF, Disp: , Rfl:    Zinc Sulfate 66 MG TABS, Take 132 mg by mouth daily., Disp: , Rfl:    ondansetron  (ZOFRAN -ODT) 4 MG disintegrating tablet, Take 1 tablet (4 mg total) by mouth every 8 (eight) hours as needed for nausea or vomiting., Disp: 20 tablet, Rfl: 1  Allergies  Allergen Reactions   Bacid Cough   Timothy Grass Pollen Allergen Other (See Comments) and Cough   Wheat Cough   ROS neg/noncontributory except as noted HPI/below      Objective:     BP 136/81   Pulse 78   Temp (!) 97.5 F (36.4 C) (Temporal)   Resp 18   Ht 5' 4 (1.626 m)   Wt 192 lb 6 oz (87.3 kg)   SpO2 94%   BMI 33.02 kg/m  Wt Readings from Last 3 Encounters:  09/11/23 192 lb 6 oz (87.3 kg)  09/09/23 190 lb 14.7 oz (86.6 kg)  08/26/23 191 lb (86.6 kg)    Physical Exam   Gen: WDWN NAD HEENT: NCAT, conjunctiva not injected, sclera nonicteric ABDOMEN:  BS decreased.   soft, mildly tender diffusely, distented but soft, No HSM, no masses EXT:  no edema MSK: no gross  abnormalities.  NEURO: A&O x3.  CN II-XII intact.  PSYCH: normal mood. Good eye contact Reviewed ER records and d/w pt and husband    Assessment & Plan:  Lower abdominal pain -     Ambulatory referral to General Surgery  Other orders -     Ondansetron ; Take 1 tablet (4 mg total) by mouth every 8 (eight) hours as needed for nausea or vomiting.  Dispense: 20 tablet; Refill: 1  Assessment and Plan    Abdominal Pain with Nausea Persistent abdominal pain and nausea have been present for two weeks, worsening with food intake. Recent fasting followed by rapid refeeding resulted in severe constipation and a possible bowel obstruction. A CT scan suggested intussusception, but surgery was not pursued. Elevated white blood cell count is likely due to prednisone use. Gallstones were found and may be contributing to nausea. The differential diagnosis includes partial bowel obstruction, gallstone-related symptoms, and residual effects of severe constipation. The risks of prolonged fasting, such as severe constipation and potential bowel obstruction, were discussed. Prolonged fasting is advised against due to health risks. Potential future issues with gallstones and the risks of gallbladder removal surgery, including chronic diarrhea, were explained. The plan is to refer to a surgeon for further evaluation, continue Miralax  to maintain bowel movements, and prescribe Zofran  for nausea every six hours as needed. A liquid and soft food diet is advised, avoiding high-fiber and greasy foods. Monitoring for high fevers or uncontrollable vomiting is essential, with instructions to return to the ER if these occur.  Lower Back Pain Lower back pain developed following recent fasting and bowel obstruction, managed with naproxen. A CT scan revealed an end plate deformity at L2, likely due to arthritis. Potential causes, including arthritis and a possible connection to bowel issues, were discussed. The plan is to continue  naproxen for pain management and consult an orthopedic specialist regarding the end plate deformity at L2 for further evaluation.  General Health Maintenance A three-week water and juice fast for faith and health reasons led to severe constipation and a possible bowel obstruction. Prolonged fasting is advised against due to health risks. A balanced diet with adequate protein and nutrition is recommended.  Follow-up An urgent appointment with [facility] is needed. Follow-up with the primary care physician as necessary.        Return if symptoms worsen or fail to improve.  Jenkins CHRISTELLA Carrel, MD

## 2023-09-11 NOTE — Telephone Encounter (Signed)
 Advised patient of upcoming appointment on 2/14 with Dr. Nandigam

## 2023-09-11 NOTE — Telephone Encounter (Signed)
 Lm on vm for patient to return call.  Patient has been scheduled for a hospital f/u with Dr. Leonia Raman on Friday, 09/20/23 at 1:30 pm.

## 2023-09-11 NOTE — Telephone Encounter (Signed)
 Noted, thanks!

## 2023-09-11 NOTE — Patient Instructions (Addendum)
 Age indeterminate mild superior endplate deformity at L2.   [facility] [address]. [place], [place] [phone]   Worse, ER

## 2023-09-13 DIAGNOSIS — M545 Low back pain, unspecified: Secondary | ICD-10-CM | POA: Diagnosis not present

## 2023-09-16 ENCOUNTER — Telehealth: Payer: Self-pay | Admitting: Family Medicine

## 2023-09-16 ENCOUNTER — Telehealth: Payer: Self-pay | Admitting: *Deleted

## 2023-09-16 ENCOUNTER — Telehealth: Payer: Self-pay

## 2023-09-16 DIAGNOSIS — M5459 Other low back pain: Secondary | ICD-10-CM | POA: Diagnosis not present

## 2023-09-16 DIAGNOSIS — I10 Essential (primary) hypertension: Secondary | ICD-10-CM

## 2023-09-16 DIAGNOSIS — M5451 Vertebrogenic low back pain: Secondary | ICD-10-CM | POA: Diagnosis not present

## 2023-09-16 NOTE — Telephone Encounter (Signed)
 Patient is requesting a order for a bone density test

## 2023-09-16 NOTE — Telephone Encounter (Signed)
Okay to order, please advise.

## 2023-09-16 NOTE — Progress Notes (Signed)
 Complex Care Management Note  Care Guide Note 09/16/2023 Name: Monica Rogers MRN: [id number] DOB: May 03, 1951  [name] is a 73 y.o. year old female who sees Christel Cousins, MD for primary care. I reached out to Laveda Ports by phone today to offer complex care management services.  [name] was given information about Complex Care Management services today including:   The Complex Care Management services include support from the care team which includes your Nurse Care Manager, Clinical Social Worker, or Pharmacist.  The Complex Care Management team is here to help remove barriers to the health concerns and goals most important to you. Complex Care Management services are voluntary, and the patient may decline or stop services at any time by request to their care team member.   Complex Care Management Consent Status: Patient did not agree to participate in complex care management services at this time.  Follow up plan:  pt appreciative but declined need for services at this time   Encounter Outcome:  Patient Refused  Kandis Ormond, CMA, Care Guide [facility]  [facility], [facility] Guide Direct Dial: [phone]  Fax: [phone] Website: [email or web address]

## 2023-09-17 ENCOUNTER — Other Ambulatory Visit: Payer: Self-pay | Admitting: *Deleted

## 2023-09-17 DIAGNOSIS — E039 Hypothyroidism, unspecified: Secondary | ICD-10-CM

## 2023-09-17 DIAGNOSIS — E2839 Other primary ovarian failure: Secondary | ICD-10-CM

## 2023-09-17 NOTE — Telephone Encounter (Signed)
Order placed

## 2023-09-18 ENCOUNTER — Other Ambulatory Visit (HOSPITAL_BASED_OUTPATIENT_CLINIC_OR_DEPARTMENT_OTHER): Payer: Self-pay | Admitting: General Surgery

## 2023-09-18 DIAGNOSIS — R1032 Left lower quadrant pain: Secondary | ICD-10-CM

## 2023-09-18 DIAGNOSIS — R1030 Lower abdominal pain, unspecified: Secondary | ICD-10-CM | POA: Diagnosis not present

## 2023-09-20 ENCOUNTER — Encounter: Payer: Self-pay | Admitting: Gastroenterology

## 2023-09-20 ENCOUNTER — Ambulatory Visit (INDEPENDENT_AMBULATORY_CARE_PROVIDER_SITE_OTHER): Payer: No Typology Code available for payment source | Admitting: Family

## 2023-09-20 ENCOUNTER — Ambulatory Visit (HOSPITAL_COMMUNITY): Payer: No Typology Code available for payment source

## 2023-09-20 ENCOUNTER — Encounter: Payer: Self-pay | Admitting: Family

## 2023-09-20 ENCOUNTER — Ambulatory Visit: Payer: No Typology Code available for payment source | Admitting: Gastroenterology

## 2023-09-20 VITALS — BP 148/78 | HR 74 | Temp 97.2°F | Ht 64.0 in | Wt 198.0 lb

## 2023-09-20 VITALS — BP 140/80 | HR 80 | Ht 65.0 in | Wt 197.0 lb

## 2023-09-20 DIAGNOSIS — K8020 Calculus of gallbladder without cholecystitis without obstruction: Secondary | ICD-10-CM

## 2023-09-20 DIAGNOSIS — G8929 Other chronic pain: Secondary | ICD-10-CM

## 2023-09-20 DIAGNOSIS — S32040S Wedge compression fracture of fourth lumbar vertebra, sequela: Secondary | ICD-10-CM

## 2023-09-20 DIAGNOSIS — R300 Dysuria: Secondary | ICD-10-CM

## 2023-09-20 DIAGNOSIS — R1013 Epigastric pain: Secondary | ICD-10-CM | POA: Diagnosis not present

## 2023-09-20 DIAGNOSIS — Z8719 Personal history of other diseases of the digestive system: Secondary | ICD-10-CM | POA: Diagnosis not present

## 2023-09-20 DIAGNOSIS — R110 Nausea: Secondary | ICD-10-CM

## 2023-09-20 LAB — POCT URINALYSIS DIPSTICK
Bilirubin, UA: NEGATIVE
Blood, UA: NEGATIVE
Glucose, UA: NEGATIVE
Ketones, UA: NEGATIVE
Leukocytes, UA: NEGATIVE
Nitrite, UA: NEGATIVE
Protein, UA: NEGATIVE
Spec Grav, UA: >=1.03 — AB (ref 1.010–1.025)
Urobilinogen, UA: 0.2 U/dL
pH, UA: 6 (ref 5.0–8.0)

## 2023-09-20 MED ORDER — ONDANSETRON HCL 4 MG PO TABS: 4.0000 mg | ORAL_TABLET | Freq: Three times a day (TID) | ORAL | 1 refills | Status: DC | PRN

## 2023-09-20 NOTE — Progress Notes (Signed)
                                                                                                                                                                                                                                                                                                                                                                                                                                                                                                                                                                                                                                                                                                                                                                                                                                                                                                                                                                                                                                                                                                                                                                                                                                                                                                                                                                                                                                                                                                                                                                                                                                                                                                                                                                                                                                                                                                                                                                                                                                                                                                                                                                                                                                                                                                                                                                                                                                                                                                                                                                                                                                                                                                                                                                                                                                                                                                                                                                                                                                                                                                                                                                                                                                                                                                                                                                                                                                                                                                                                                                                                                                                                                                                                                                                                                                                                                                                                                                                                                                                                                                                                                                                                                                                                                                                                                                                                                                                                                                                                                                                                                                                                                                                                                                                                                                                                                                                                                                                                                                                                                                                                                                                                                                                                                                                                                                                                                                                                                                                                                                                                                                                                                                                                                                                                                                                                                                                                                                                                                                                                                                                                                                                                                                                                                                                                                                                                                                                                                                                                                                                                                                                                                                                                                                                                                                                                                                                                                                                                                                                                                                                                                                                                                                                                                                                                                                                                                                                                                                                                                                                                                                                                                                                                                                                                                                                                                                                                                                                                                                                                                                                                                                                                                                                                                                                                                                                                                                                                                                                                                                                                                                                                                                                                                                                                                                                                                                                                                                                                                                                                                                                                                                                                                                                                                                                                                                                                                                                                                                                                                                                                                                                                                                                                                                                                                                                                                                                                                                                                                                                                                                                                                                                                                                                                                                                                                                                                                                                                                                                                                                                                                                                                                                                                                                                                                                                                                                                                                                                                                                                                                                                                                                                                                                                                                                          Monica Rogers    [id number]    [birth year]  Primary Care Physician:Kulik, Maryruth Hancock, MD  Referring Physician: Jeani Sow, MD [address] [place],  [place] [address]   Chief complaint:  Nausea, epigastric pain  Discussed the use of AI scribe software for clinical note transcription with the patient, who gave verbal consent to proceed.  History of Present Illness   Monica Rogers is a 73 year old very pleasant female who presents with persistent nausea and abdominal pain.  She has been experiencing persistent nausea and abdominal pain for the past two weeks, which began suddenly. She was admitted to the emergency room due to an inability to have a bowel movement and was diagnosed with an obstructive bowel. A CT scan suggested intussusception, and she was treated with medications that resolved the bowel  obstruction. Despite this, she continues to experience daily nausea and stomach aches, particularly after eating, in the upper abdomen.  During the emergency room visit, a CT scan revealed the presence of gallstones. She questions whether her symptoms could be related to her gallbladder, as she experiences nausea and discomfort in the upper abdomen, particularly after eating. She describes the nausea as being located in the upper abdomen.  She reports having regular bowel movements now, approximately three to four times a day, and has stopped using MiraLAX due to it causing diarrhea. She has a history of a colonoscopy in May 2022, which was normal except for the removal of polyps.  She mentions a history of a compressed disc diagnosed prior to the onset of her gastrointestinal symptoms. She experiences pain in the lower abdomen, which she describes as feeling like 'pins and needles' and 'a band.' She uses a walker to move around, especially at night, due to back pain. An MRI and DEXA scan have been scheduled to further evaluate her back condition.  No recent constipation and confirms having regular bowel movements. Occasional vomiting and abdominal swelling. Pain in the lower abdomen and back, uses a walker for mobility.     CT abd & pelvis 09/09/2023 1. Short segment left lower quadrant enteric intussusception. There is some fluid-filled borderline to mildly distended small bowel in the left mid and upper quadrant upstream to the intussusception, suggesting that there may be a degree of obstruction associated with the intussusception. Elsewhere fluid-filled nondilated small bowel and questionable mild wall thickening of jejunal loops, correlate for any history of enteritis. 2. Diverticular disease of the colon without acute wall thickening. 3. Gallstones. 4. Age indeterminate mild superior endplate deformity at L2. 5. Aortic atherosclerosis.  Colonoscopy 12/12/2020 - One less than 1 mm polyp in  the ascending colon, removed with a cold biopsy forceps. Resected and retrieved. - One 12 mm polyp in the transverse colon, removed piecemeal using a cold snare. Resected and retrieved. - Severe diverticulosis in the sigmoid colon, in the descending colon, in the transverse colon, in the ascending colon and in the cecum. Purulent discharge was seen in association with the diverticular opening, suspicious of diverticulitis. - Non- bleeding external and internal hemorrhoids. Surgical [P], colon, transverse and ascending (cbx), polyp (2) - TUBULAR ADENOMA (X MULTIPLE). - NEGATIVE FOR HIGH GRADE DYSPLASIA.  Outpatient Encounter Medications as of 09/20/2023  Medication Sig   AMBULATORY NON FORMULARY MEDICATION Bone Up 1 tablet in the morning and 1 in the afternoon   ARMOUR THYROID 120 MG tablet TAKE 1 TABLET BY MOUTH EVERY DAY   Ascorbic Acid (VITAMIN C) 100 MG CHEW  B Complex Vitamins (VITAMIN B COMPLEX 100 IJ)    Blood Glucose Monitoring Suppl (ONETOUCH VERIO FLEX SYSTEM) DEVI USE TO TEST BLOOD SUGAR DAILY   Butalbital-APAP-Caffeine 50-300-40 MG CAPS Take 1 capsule by mouth every 6 (six) hours as needed.   CALCIUM PO Take 1 tablet by mouth daily.   Carboxymethylcellulose Sodium (EYE DROPS OP) Apply 2 drops to eye 2 (two) times daily as needed (dry eyes).   Cholecalciferol (VITAMIN D3) 50 MCG (2000 UT) capsule    Clobetasol Propionate 0.05 % shampoo WASH SCALP 2X WEEKLY. APPLY TO SCALP, LET SIT 10 MINUTES AND RINSE   clotrimazole-betamethasone (LOTRISONE) cream Apply a small amount to affected area 1-2 times per day for 14 days, then stop and use as needed.   Coenzyme Q10 (COQ-10) 100 MG capsule    dexamethasone (DECADRON) 0.1 % ophthalmic solution SMARTSIG:2 Drop(s) In Ear(s)   Digestive Enzymes (ENZYME DIGEST) CAPS Take 2 capsules by mouth daily.   docusate sodium (COLACE) 250 MG capsule Take 1 capsule (250 mg total) by mouth daily. (Patient taking differently: Take 100 mg by mouth daily.)    glucose blood (ONETOUCH VERIO) test strip Use as instructed   Lancets (ONETOUCH DELICA PLUS LANCET30G) MISC See admin instructions.   Magnesium 110 MG CAPS Take 2 capsules by mouth 2 (two) times daily.   methocarbamol (ROBAXIN) 500 MG tablet PLEASE SEE ATTACHED FOR DETAILED DIRECTIONS   naproxen (NAPROSYN) 500 MG tablet Take 500 mg by mouth 2 (two) times daily with a meal.   olmesartan (BENICAR) 20 MG tablet Take 1 tablet (20 mg total) by mouth daily.   Omega-3 Fatty Acids (FISH OIL PO) Take 1 tablet by mouth 2 (two) times daily.   ondansetron (ZOFRAN) 4 MG tablet Take 1 tablet (4 mg total) by mouth every 8 (eight) hours as needed for nausea or vomiting.   ondansetron (ZOFRAN-ODT) 4 MG disintegrating tablet Take 1 tablet (4 mg total) by mouth every 8 (eight) hours as needed for nausea or vomiting.   Polyethylene Glycol 3350 (MIRALAX PO) Take by mouth as needed.   predniSONE (DELTASONE) 10 MG tablet    psyllium (METAMUCIL) 58.6 % packet Take 1 packet by mouth 2 (two) times daily.   QUERCETIN PO Take 2 tablets by mouth 3 (three) times daily.   rosuvastatin (CRESTOR) 10 MG tablet Take 1 tablet (10 mg total) by mouth daily.   Testosterone 1.62 % GEL    UNABLE TO FIND Take 4 capsules by mouth daily. Med Name: ZYFLAMEND HERBAL PAIN RELIEF   Zinc Sulfate 66 MG TABS Take 132 mg by mouth daily.   No facility-administered encounter medications on file as of 09/20/2023.    Allergies as of 09/20/2023 - Review Complete 09/20/2023  Allergen Reaction Noted   Bacid Cough 04/26/2022   Timothy grass pollen allergen Other (See Comments) and Cough 04/26/2022   Wheat Cough 04/26/2022    Past Medical History:  Diagnosis Date   Allergy    Anemia    Arthritis    Blood transfusion without reported diagnosis    Breast cancer (HCC) 2024   R   Chronic headaches    Diverticulosis    Hyperlipidemia    Hypertension    Hypothyroidism    IBS (irritable bowel syndrome)    Prediabetes    Recurrent UTI      Past Surgical History:  Procedure Laterality Date   BREAST BIOPSY Right 2019   BREAST BIOPSY Right 12/25/2022   [place] RT BREAST BX W LOC DEV  1ST LESION IMG BX SPEC US GUIDE 12/25/2022 [facility]   BREAST BIOPSY Right 01/01/2023   [place] RT BREAST BX W LOC DEV 1ST LESION IMG BX SPEC US GUIDE 01/01/2023 [facility]   BREAST BIOPSY  02/25/2023   MM RT RADIOACTIVE SEED LOC MAMMO GUIDE 02/25/2023 [facility]   BREAST BIOPSY  02/25/2023   MM RT RADIOACTIVE SEED EA ADD LESION LOC MAMMO GUIDE 02/25/2023 [facility]   BREAST LUMPECTOMY WITH RADIOACTIVE SEED LOCALIZATION Right 02/26/2023   Procedure: RIGHT BREAST BRACKETED SEED LUMPECTOMY;  Surgeon: Harriette Bouillon, MD;  Location: [facility];  Service: General;  Laterality: Right;   IR ANGIOGRAM SELECTIVE EACH ADDITIONAL VESSEL  09/28/2020   IR ANGIOGRAM SELECTIVE EACH ADDITIONAL VESSEL  09/28/2020   IR ANGIOGRAM SELECTIVE EACH ADDITIONAL VESSEL  09/28/2020   IR ANGIOGRAM SELECTIVE EACH ADDITIONAL VESSEL  09/28/2020   IR ANGIOGRAM VISCERAL SELECTIVE  09/28/2020   IR ANGIOGRAM VISCERAL SELECTIVE  09/28/2020   IR EMBO ART  VEN HEMORR LYMPH EXTRAV  INC GUIDE ROADMAPPING  09/28/2020   IR US GUIDE VASC ACCESS RIGHT  09/28/2020   TONSILLECTOMY      Family History  Problem Relation Age of Onset   Hearing loss Mother    Miscarriages / [place] Mother    Alcohol abuse Father    Hearing loss Father    Hypertension Father    Diabetes Brother    Cancer Maternal Grandmother        possibly had breast cancer dx. in her 30s   Diabetes Paternal Grandmother     Social History   Socioeconomic History   Marital status: Married    Spouse name: Not on file   Number of children: 3   Years of education: Not on file   Highest education level: Master's degree (e.g., MA, MS, MEng, MEd, MSW, MBA)  Occupational History   Occupation: Artist  Tobacco Use   Smoking status: Never   Smokeless tobacco: Never  Vaping Use    Vaping status: Never Used  Substance and Sexual Activity   Alcohol use: No   Drug use: No   Sexual activity: Not Currently    Birth control/protection: None  Other Topics Concern   Not on file  Social History Narrative   1 grand      Retired Runner, broadcasting/film/video.   Artist now   Social Drivers of Corporate investment banker Strain: Low Risk  (09/19/2023)   Overall Financial Resource Strain (CARDIA)    Difficulty of Paying Living Expenses: Not hard at all  Recent Concern: Financial Resource Strain - Medium Risk (07/10/2023)   Overall Financial Resource Strain (CARDIA)    Difficulty of Paying Living Expenses: Somewhat hard  Food Insecurity: No Food Insecurity (09/19/2023)   Hunger Vital Sign    Worried About Running Out of Food in the Last Year: Never true    Ran Out of Food in the Last Year: Never true  Recent Concern: Food Insecurity - Food Insecurity Present (07/10/2023)   Hunger Vital Sign    Worried About Running Out of Food in the Last Year: Never true    Ran Out of Food in the Last Year: Sometimes true  Transportation Needs: No Transportation Needs (09/19/2023)   PRAPARE - Administrator, Civil Service (Medical): No    Lack of Transportation (Non-Medical): No  Physical Activity: Insufficiently Active (09/19/2023)   Exercise Vital Sign    Days of Exercise per Week: 5 days    Minutes  of Exercise per Session: 20 min  Stress: No Stress Concern Present (09/19/2023)   [employer] of Occupational Health - Occupational Stress Questionnaire    Feeling of Stress : Not at all  Social Connections: Socially Integrated (09/19/2023)   Social Connection and Isolation Panel [NHANES]    Frequency of Communication with Friends and Family: Three times a week    Frequency of Social Gatherings with Friends and Family: Twice a week    Attends Religious Services: More than 4 times per year    Active Member of [employer] or Organizations: Yes    Attends Banker Meetings: 1 to 4 times per  year    Marital Status: Married  Catering manager Violence: Not At Risk (05/20/2023)   Humiliation, Afraid, Rape, and Kick questionnaire    Fear of Current or Ex-Partner: No    Emotionally Abused: No    Physically Abused: No    Sexually Abused: No      Review of systems: All other review of systems negative except as mentioned in the HPI.   Physical Exam: Vitals:   09/20/23 1321  BP: (!) 140/80  Pulse: 80   Body mass index is 32.78 kg/m. Gen:      No acute distress HEENT:  sclera anicteric CV: s1s2 rrr, no murmur Lungs: B/l clear. Abd:      soft, non-tender; no palpable masses, no distension Ext:    No edema Neuro: alert and oriented x 3 Psych: normal mood and affect  Data Reviewed:  Reviewed labs, radiology imaging, old records and pertinent past GI work up     Assessment and Plan    Intussusception Acute bowel obstruction likely due to intussusception, initially confirmed by CT showing telescoping of the bowel, now resolved. Differential includes small polyp, lesions, stromal tumor or other extrinsic obstruction. Symptoms included nausea, vomiting, and abdominal distension. Discussed potential need for surgery if recurrence occurs. - follow-up CT scan ordered by Dr. Romie Levee -Will consider small bowel video capsule if needed based on CT findings - Monitor bowel habits and ensure daily bowel movements - Advise to return to ER if symptoms recur  Gallstones Gallstones noted on CT scan, causing nausea, abdominal pain, and postprandial discomfort. Differential includes chronic cholecystitis, gallbladder dysfunction. Discussed potential need for surgery if gallbladder dysfunction is confirmed. - Order HIDA scan - Advise small frequent meals with low-fat content - Prescribe Zofran for nausea, as needed  Lumbar Compression Fracture Compression fracture at L4 causing lower abdominal pain and radiculopathy. Symptoms include paresthesia and pain exacerbated by  movement. MRI will assess nerve compression. DEXA scan to rule out osteoporosis. -Follow-up MRI of the lumbar spine, was requested by her orthopedic surgeon -Follow-up DEXA scan - Follow-up with back surgeon on March 7 for MRI results  General Health Maintenance Due for repeat colonoscopy May 2025 for surveillance following the previous one in May 2022. To be scheduled post back condition improvement. - Schedule colonoscopy after back condition improves  Follow-up - Follow-up appointment in 2-3 months - Review scan results and adjust management as needed.       This visit required 40 minutes of patient care (this includes precharting, chart review, review of results, face-to-face time used for counseling as well as treatment plan and follow-up. The patient was provided an opportunity to ask questions and all were answered. The patient agreed with the plan and demonstrated an understanding of the instructions.  Iona Beard , MD    CC: Jeani Sow, MD

## 2023-09-20 NOTE — Progress Notes (Signed)
Patient ID: Monica Rogers, female    DOB: 08/07/1950, 73 y.o.   MRN: [id number]  Chief Complaint  Patient presents with   Dysuria    Pt c/o dysuria, Present for 4 days.      HPI: Urinary sx:  pt reports frequent urination, burning, foul odor. Denies pelvic pain, low back pain, fever, hematuria or any vaginal sx. She reports having a UTI a couple of months ago and has been given vaginal estrogen cream but has not started yet. Reports decreased water intake but denies any dark urine.  Assessment & Plan:  1. Dysuria (Primary) UA neg, mild concentration. Advised pt on increased water intake, at least 2L qd. Advised can try AZO for 3d only if sx in future. Advised on the estrogen cream as beneficial for UTI prevention and encourage starting.  - POCT Urinalysis Dipstick  Subjective:    Outpatient Medications Prior to Visit  Medication Sig Dispense Refill   AMBULATORY NON FORMULARY MEDICATION Bone Up 1 tablet in the morning and 1 in the afternoon     ARMOUR THYROID 120 MG tablet TAKE 1 TABLET BY MOUTH EVERY DAY 90 tablet 4   Ascorbic Acid (VITAMIN C) 100 MG CHEW      B Complex Vitamins (VITAMIN B COMPLEX 100 IJ)      Blood Glucose Monitoring Suppl (ONETOUCH VERIO FLEX SYSTEM) DEVI USE TO TEST BLOOD SUGAR DAILY     Butalbital-APAP-Caffeine 50-300-40 MG CAPS Take 1 capsule by mouth every 6 (six) hours as needed. 14 capsule 0   CALCIUM PO Take 1 tablet by mouth daily.     Carboxymethylcellulose Sodium (EYE DROPS OP) Apply 2 drops to eye 2 (two) times daily as needed (dry eyes).     Cholecalciferol (VITAMIN D3) 50 MCG (2000 UT) capsule      Clobetasol Propionate 0.05 % shampoo WASH SCALP 2X WEEKLY. APPLY TO SCALP, LET SIT 10 MINUTES AND RINSE     clotrimazole-betamethasone (LOTRISONE) cream Apply a small amount to affected area 1-2 times per day for 14 days, then stop and use as needed.     Coenzyme Q10 (COQ-10) 100 MG capsule      dexamethasone (DECADRON) 0.1 % ophthalmic solution SMARTSIG:2  Drop(s) In Ear(s)     Digestive Enzymes (ENZYME DIGEST) CAPS Take 2 capsules by mouth daily.     docusate sodium (COLACE) 250 MG capsule Take 1 capsule (250 mg total) by mouth daily. (Patient taking differently: Take 100 mg by mouth daily.) 10 capsule 0   glucose blood (ONETOUCH VERIO) test strip Use as instructed 100 each 2   Lancets (ONETOUCH DELICA PLUS LANCET30G) MISC See admin instructions.     Magnesium 110 MG CAPS Take 2 capsules by mouth 2 (two) times daily.     methocarbamol (ROBAXIN) 500 MG tablet PLEASE SEE ATTACHED FOR DETAILED DIRECTIONS     naproxen (NAPROSYN) 500 MG tablet Take 500 mg by mouth 2 (two) times daily with a meal.     olmesartan (BENICAR) 20 MG tablet Take 1 tablet (20 mg total) by mouth daily. 90 tablet 3   Omega-3 Fatty Acids (FISH OIL PO) Take 1 tablet by mouth 2 (two) times daily.     ondansetron (ZOFRAN-ODT) 4 MG disintegrating tablet Take 1 tablet (4 mg total) by mouth every 8 (eight) hours as needed for nausea or vomiting. 20 tablet 1   Polyethylene Glycol 3350 (MIRALAX PO) Take by mouth as needed.     psyllium (METAMUCIL) 58.6 % packet Take 1  packet by mouth 2 (two) times daily.     QUERCETIN PO Take 2 tablets by mouth 3 (three) times daily.     rosuvastatin (CRESTOR) 10 MG tablet Take 1 tablet (10 mg total) by mouth daily. 90 tablet 0   Testosterone 1.62 % GEL      UNABLE TO FIND Take 4 capsules by mouth daily. Med Name: ZYFLAMEND HERBAL PAIN RELIEF     Zinc Sulfate 66 MG TABS Take 132 mg by mouth daily.     predniSONE (DELTASONE) 10 MG tablet TAKE 6 TABLETS FOR 2 DAYS THEN 5 ,4,3,2,AND 1 FOR 2 DAYS EACH (Patient not taking: Reported on 09/20/2023)     No facility-administered medications prior to visit.   Past Medical History:  Diagnosis Date   Allergy    Anemia    Arthritis    Blood transfusion without reported diagnosis    Breast cancer (HCC) 2024   R   Chronic headaches    Diverticulosis    Hyperlipidemia    Hypertension    Hypothyroidism     IBS (irritable bowel syndrome)    Prediabetes    Recurrent UTI    Past Surgical History:  Procedure Laterality Date   BREAST BIOPSY Right 2019   BREAST BIOPSY Right 12/25/2022   [place] RT BREAST BX W LOC DEV 1ST LESION IMG BX SPEC US GUIDE 12/25/2022 [facility]   BREAST BIOPSY Right 01/01/2023   [place] RT BREAST BX W LOC DEV 1ST LESION IMG BX SPEC US GUIDE 01/01/2023 [facility]   BREAST BIOPSY  02/25/2023   MM RT RADIOACTIVE SEED LOC MAMMO GUIDE 02/25/2023 [facility]   BREAST BIOPSY  02/25/2023   MM RT RADIOACTIVE SEED EA ADD LESION LOC MAMMO GUIDE 02/25/2023 [facility]   BREAST LUMPECTOMY WITH RADIOACTIVE SEED LOCALIZATION Right 02/26/2023   Procedure: RIGHT BREAST BRACKETED SEED LUMPECTOMY;  Surgeon: Harriette Bouillon, MD;  Location: [facility];  Service: General;  Laterality: Right;   IR ANGIOGRAM SELECTIVE EACH ADDITIONAL VESSEL  09/28/2020   IR ANGIOGRAM SELECTIVE EACH ADDITIONAL VESSEL  09/28/2020   IR ANGIOGRAM SELECTIVE EACH ADDITIONAL VESSEL  09/28/2020   IR ANGIOGRAM SELECTIVE EACH ADDITIONAL VESSEL  09/28/2020   IR ANGIOGRAM VISCERAL SELECTIVE  09/28/2020   IR ANGIOGRAM VISCERAL SELECTIVE  09/28/2020   IR EMBO ART  VEN HEMORR LYMPH EXTRAV  INC GUIDE ROADMAPPING  09/28/2020   IR US GUIDE VASC ACCESS RIGHT  09/28/2020   TONSILLECTOMY     Allergies  Allergen Reactions   Bacid Cough   Timothy Grass Pollen Allergen Other (See Comments) and Cough   Wheat Cough      Objective:    Physical Exam Vitals and nursing note reviewed.  Constitutional:      Appearance: Normal appearance.  Cardiovascular:     Rate and Rhythm: Normal rate and regular rhythm.  Pulmonary:     Effort: Pulmonary effort is normal.     Breath sounds: Normal breath sounds.  Musculoskeletal:        General: Normal range of motion.  Skin:    General: Skin is warm and dry.  Neurological:     Mental Status: She is alert.  Psychiatric:        Mood and Affect: Mood normal.         Behavior: Behavior normal.    BP (!) 148/78 (BP Location: Left Arm, Patient Position: Sitting)   Pulse 74   Temp (!) 97.2 F (36.2 C) (Temporal)   Ht 5'  4" (1.626 m)   Wt 198 lb (89.8 kg)   SpO2 95%   BMI 33.99 kg/m  Wt Readings from Last 3 Encounters:  09/20/23 198 lb (89.8 kg)  09/11/23 192 lb 6 oz (87.3 kg)  09/09/23 190 lb 14.7 oz (86.6 kg)       Dulce Sellar, NP

## 2023-09-20 NOTE — Patient Instructions (Signed)
We have sent the following medications to your pharmacy for you to pick up at your convenience: Zofran.   You have been scheduled for a HIDA scan at [facility] (1st floor) on 10/02/23. Please arrive 30 minutes prior to your scheduled appointment at  8:00 am. Make certain not to have anything to eat or drink at least 6 hours prior to your test. Should this appointment date or time not work well for you, please call radiology scheduling at [phone].  _____________________________________________________________________ hepatobiliary (HIDA) scan is an imaging procedure used to diagnose problems in the liver, gallbladder and bile ducts. In the HIDA scan, a radioactive chemical or tracer is injected into a vein in your arm. The tracer is handled by the liver like bile. Bile is a fluid produced and excreted by your liver that helps your digestive system break down fats in the foods you eat. Bile is stored in your gallbladder and the gallbladder releases the bile when you eat a meal. A special nuclear medicine scanner (gamma camera) tracks the flow of the tracer from your liver into your gallbladder and small intestine.  During your HIDA scan  You'll be asked to change into a hospital gown before your HIDA scan begins. Your health care team will position you on a table, usually on your back. The radioactive tracer is then injected into a vein in your arm.The tracer travels through your bloodstream to your liver, where it's taken up by the bile-producing cells. The radioactive tracer travels with the bile from your liver into your gallbladder and through your bile ducts to your small intestine.You may feel some pressure while the radioactive tracer is injected into your vein. As you lie on the table, a special gamma camera is positioned over your abdomen taking pictures of the tracer as it moves through your body. The gamma camera takes pictures continually for about an hour. You'll need to keep still  during the HIDA scan. This can become uncomfortable, but you may find that you can lessen the discomfort by taking deep breaths and thinking about other things. Tell your health care team if you're uncomfortable. The radiologist will watch on a computer the progress of the radioactive tracer through your body. The HIDA scan may be stopped when the radioactive tracer is seen in the gallbladder and enters your small intestine. This typically takes about an hour. In some cases extra imaging will be performed if original images aren't satisfactory, if morphine is given to help visualize the gallbladder or if the medication CCK is given to look at the contraction of the gallbladder. This test typically takes 2 hours to complete. ________________________________________________________________________      VISIT SUMMARY:  During today's visit, we discussed your ongoing symptoms of nausea and abdominal pain, which have persisted despite treatment for a bowel obstruction. We reviewed your recent CT scan findings, which showed gallstones, and discussed your back pain related to a lumbar compression fracture. We have outlined a plan to address each of these issues and scheduled necessary follow-up tests and appointments.  YOUR PLAN:  -INTUSSUSCEPTION: Intussusception is a condition where part of the intestine folds into another section, causing a blockage. Your initial CT scan showed this, but it has since resolved. We will monitor your bowel habits and order a follow-up CT scan to ensure there is no recurrence. If symptoms return, you should go to the emergency room immediately.  -GALLSTONES: Gallstones are hard deposits that form in the gallbladder and can cause nausea and  abdominal pain, especially after eating. We will order a HIDA scan to check your gallbladder function. In the meantime, eat small, low-fat meals and take Zofran as needed for nausea.  -LUMBAR COMPRESSION FRACTURE: A lumbar compression  fracture is a break in one of the bones in your lower back, which can cause pain and nerve issues. We will perform an MRI to check for nerve compression and a DEXA scan to rule out osteoporosis. You have a follow-up appointment with a back surgeon on March 7 to review the MRI results.  -GENERAL HEALTH MAINTENANCE: You are due for a repeat colonoscopy following your last one in May 2022. We will schedule this after your back condition improves.  INSTRUCTIONS:  Please follow up in 2-3 months for a review of your scan results and to adjust your management plan as needed. Ensure you monitor your bowel habits and return to the emergency room if your symptoms recur.

## 2023-09-23 ENCOUNTER — Telehealth: Payer: Self-pay | Admitting: Family Medicine

## 2023-09-23 NOTE — Telephone Encounter (Signed)
Urine was abnormal only for elevated specific gravity which indicates dehydration which we discussed during her visit and the need for more water intake - at least 2 liters daily.

## 2023-09-23 NOTE — Telephone Encounter (Signed)
Patient seen on Friday by Judeth Cornfield. Please advise.

## 2023-09-23 NOTE — Telephone Encounter (Signed)
Copied from CRM [id number]. Topic: Clinical - Medical Advice >> Sep 23, 2023  9:00 AM Monica Rogers wrote: Reason for CRM: Patient states she was seen on 2/14 for Rogers UTI and everything was normal - she received Rogers message through MyChart that's its abnormal, would like to discuss this with someone.

## 2023-09-24 NOTE — Telephone Encounter (Signed)
I called pt in regards to lab, pt verbalized understanding.

## 2023-09-25 ENCOUNTER — Ambulatory Visit (INDEPENDENT_AMBULATORY_CARE_PROVIDER_SITE_OTHER)
Admission: RE | Admit: 2023-09-25 | Discharge: 2023-09-25 | Disposition: A | Discharge status: Home / Self Care | Payer: No Typology Code available for payment source | Source: Ambulatory Visit | Attending: Family Medicine | Admitting: Family Medicine

## 2023-09-25 DIAGNOSIS — E2839 Other primary ovarian failure: Secondary | ICD-10-CM | POA: Diagnosis not present

## 2023-09-25 DIAGNOSIS — E039 Hypothyroidism, unspecified: Secondary | ICD-10-CM

## 2023-09-25 LAB — HM DEXA SCAN: HM Dexa Scan: NORMAL

## 2023-09-26 ENCOUNTER — Other Ambulatory Visit (HOSPITAL_COMMUNITY): Payer: No Typology Code available for payment source

## 2023-09-26 ENCOUNTER — Ambulatory Visit (HOSPITAL_COMMUNITY): Payer: No Typology Code available for payment source

## 2023-09-26 ENCOUNTER — Encounter: Payer: Self-pay | Admitting: Family Medicine

## 2023-09-26 ENCOUNTER — Encounter (HOSPITAL_COMMUNITY): Payer: Self-pay

## 2023-10-02 ENCOUNTER — Ambulatory Visit (HOSPITAL_COMMUNITY): Payer: No Typology Code available for payment source

## 2023-10-04 ENCOUNTER — Encounter (HOSPITAL_COMMUNITY): Payer: Self-pay

## 2023-10-04 ENCOUNTER — Ambulatory Visit (HOSPITAL_COMMUNITY): Payer: No Typology Code available for payment source

## 2023-10-08 ENCOUNTER — Other Ambulatory Visit: Payer: Self-pay | Admitting: Family Medicine

## 2023-10-08 DIAGNOSIS — E034 Atrophy of thyroid (acquired): Secondary | ICD-10-CM

## 2023-10-10 ENCOUNTER — Other Ambulatory Visit: Payer: No Typology Code available for payment source

## 2023-10-10 DIAGNOSIS — E669 Obesity, unspecified: Secondary | ICD-10-CM | POA: Diagnosis not present

## 2023-10-10 DIAGNOSIS — I10 Essential (primary) hypertension: Secondary | ICD-10-CM | POA: Diagnosis not present

## 2023-10-10 DIAGNOSIS — G43909 Migraine, unspecified, not intractable, without status migrainosus: Secondary | ICD-10-CM | POA: Diagnosis not present

## 2023-10-10 DIAGNOSIS — R2681 Unsteadiness on feet: Secondary | ICD-10-CM | POA: Diagnosis not present

## 2023-10-10 DIAGNOSIS — E039 Hypothyroidism, unspecified: Secondary | ICD-10-CM | POA: Diagnosis not present

## 2023-10-10 DIAGNOSIS — M545 Low back pain, unspecified: Secondary | ICD-10-CM | POA: Diagnosis not present

## 2023-10-10 DIAGNOSIS — Z6831 Body mass index (BMI) 31.0-31.9, adult: Secondary | ICD-10-CM | POA: Diagnosis not present

## 2023-10-10 DIAGNOSIS — M5459 Other low back pain: Secondary | ICD-10-CM | POA: Diagnosis not present

## 2023-10-10 DIAGNOSIS — E1169 Type 2 diabetes mellitus with other specified complication: Secondary | ICD-10-CM | POA: Diagnosis not present

## 2023-10-10 DIAGNOSIS — Z008 Encounter for other general examination: Secondary | ICD-10-CM | POA: Diagnosis not present

## 2023-10-10 DIAGNOSIS — E785 Hyperlipidemia, unspecified: Secondary | ICD-10-CM | POA: Diagnosis not present

## 2023-10-17 DIAGNOSIS — M4856XD Collapsed vertebra, not elsewhere classified, lumbar region, subsequent encounter for fracture with routine healing: Secondary | ICD-10-CM | POA: Diagnosis not present

## 2023-10-17 DIAGNOSIS — M5451 Vertebrogenic low back pain: Secondary | ICD-10-CM | POA: Diagnosis not present

## 2023-10-29 DIAGNOSIS — M4856XD Collapsed vertebra, not elsewhere classified, lumbar region, subsequent encounter for fracture with routine healing: Secondary | ICD-10-CM | POA: Diagnosis not present

## 2023-11-12 DIAGNOSIS — M4856XD Collapsed vertebra, not elsewhere classified, lumbar region, subsequent encounter for fracture with routine healing: Secondary | ICD-10-CM | POA: Diagnosis not present

## 2023-11-12 DIAGNOSIS — M5459 Other low back pain: Secondary | ICD-10-CM | POA: Diagnosis not present

## 2023-11-12 DIAGNOSIS — M5451 Vertebrogenic low back pain: Secondary | ICD-10-CM | POA: Diagnosis not present

## 2023-11-21 ENCOUNTER — Ambulatory Visit (HOSPITAL_COMMUNITY): Payer: No Typology Code available for payment source

## 2023-11-25 ENCOUNTER — Ambulatory Visit: Admitting: Physical Therapy

## 2023-12-06 ENCOUNTER — Ambulatory Visit

## 2023-12-10 ENCOUNTER — Encounter: Payer: Self-pay | Admitting: Family Medicine

## 2023-12-10 ENCOUNTER — Ambulatory Visit: Admitting: Family Medicine

## 2023-12-10 VITALS — BP 114/71 | HR 97 | Temp 97.2°F | Ht 65.0 in | Wt 198.8 lb

## 2023-12-10 DIAGNOSIS — N390 Urinary tract infection, site not specified: Secondary | ICD-10-CM

## 2023-12-10 LAB — POCT URINALYSIS DIPSTICK
Bilirubin, UA: NEGATIVE
Blood, UA: NEGATIVE
Glucose, UA: NEGATIVE
Ketones, UA: NEGATIVE
Nitrite, UA: NEGATIVE
Protein, UA: NEGATIVE
Spec Grav, UA: >=1.03 — AB (ref 1.010–1.025)
Urobilinogen, UA: 0.2 U/dL
pH, UA: 5.5 (ref 5.0–8.0)

## 2023-12-10 MED ORDER — NITROFURANTOIN MONOHYD MACRO 100 MG PO CAPS
100.0000 mg | ORAL_CAPSULE | Freq: Two times a day (BID) | ORAL | 0 refills | Status: DC
Start: 2023-12-10 — End: 2024-01-09

## 2023-12-10 NOTE — Patient Instructions (Signed)
 It was nice to see you!  You have a urinary tract infection. Please start the antibiotic.  We will check a urine culture to make sure you do not have a resistant bacteria. We will call you if we need to change your medications.   Please make sure you are drinking plenty of fluids over the next few days.  If your symptoms do not improve over the next 5-7 days, or if they worsen, please let us know. Please also let us know if you have worsening back pain, fevers, chills, or body aches.   Take care, Dr Jimmey Ralph

## 2023-12-10 NOTE — Progress Notes (Signed)
   KAYA CAJAS is a 73 y.o. female who presents today for an office visit.  Assessment/Plan:  Urinary frequency History and UA consistent with UTI.  We will empirically start Macrobid  100 mg twice daily while we await culture results.  Encouraged hydration.  No signs of systemic illness.  We discussed reasons to return to care or seek emergent care.    Subjective:  HPI:  See A/P for status of chronic conditions.  Patient is here today with concern for UTI.  Started 4 days ago. Symptoms include a lot of cramping and frequent urination. She is not having any dysuria. No fevers or chills. Feels like a typical urinary tract infection.        Objective:  Physical Exam: BP 114/71   Pulse 97   Temp (!) 97.2 F (36.2 C) (Temporal)   Ht 5\' 5"  (1.651 m)   Wt 198 lb 12.8 oz (90.2 kg)   SpO2 95%   BMI 33.08 kg/m   Gen: No acute distress, resting comfortably CV: Regular rate and rhythm with no murmurs appreciated Pulm: Normal work of breathing, clear to auscultation bilaterally with no crackles, wheezes, or rhonchi Neuro: Grossly normal, moves all extremities Psych: Normal affect and thought content      Vanna Shavers M. Daneil Dunker, MD 12/10/2023 11:56 AM

## 2023-12-12 LAB — URINE CULTURE
MICRO NUMBER:: 16419325
SPECIMEN QUALITY:: ADEQUATE

## 2023-12-12 NOTE — Progress Notes (Signed)
 Her urine culture confirms UTI however unfortunately the antibiotic we have her on may not be effective.  Recommend that we switch to Keflex  500 mg twice daily x 7 days.  Please send a new prescription.  She should let us  know if symptoms are not improving.

## 2023-12-13 ENCOUNTER — Other Ambulatory Visit: Payer: Self-pay | Admitting: *Deleted

## 2023-12-13 MED ORDER — CEPHALEXIN 500 MG PO CAPS: 500.0000 mg | ORAL_CAPSULE | Freq: Two times a day (BID) | ORAL | 0 refills | Status: AC

## 2023-12-16 ENCOUNTER — Ambulatory Visit
Admission: RE | Admit: 2023-12-16 | Discharge: 2023-12-16 | Disposition: A | Discharge status: Home / Self Care | Payer: No Typology Code available for payment source | Source: Ambulatory Visit | Attending: Adult Health | Admitting: Adult Health

## 2023-12-16 DIAGNOSIS — C50511 Malignant neoplasm of lower-outer quadrant of right female breast: Secondary | ICD-10-CM

## 2023-12-16 DIAGNOSIS — Z853 Personal history of malignant neoplasm of breast: Secondary | ICD-10-CM | POA: Diagnosis not present

## 2023-12-16 DIAGNOSIS — Z08 Encounter for follow-up examination after completed treatment for malignant neoplasm: Secondary | ICD-10-CM | POA: Diagnosis not present

## 2023-12-24 ENCOUNTER — Ambulatory Visit: Payer: No Typology Code available for payment source | Admitting: Gastroenterology

## 2023-12-26 ENCOUNTER — Other Ambulatory Visit: Payer: Self-pay | Admitting: *Deleted

## 2023-12-26 DIAGNOSIS — E034 Atrophy of thyroid (acquired): Secondary | ICD-10-CM

## 2023-12-26 MED ORDER — ROSUVASTATIN CALCIUM 10 MG PO TABS
10.0000 mg | ORAL_TABLET | Freq: Every day | ORAL | 0 refills | Status: DC
Start: 2023-12-26 — End: 2024-02-22

## 2024-01-07 DIAGNOSIS — H938X3 Other specified disorders of ear, bilateral: Secondary | ICD-10-CM | POA: Diagnosis not present

## 2024-01-07 DIAGNOSIS — H93293 Other abnormal auditory perceptions, bilateral: Secondary | ICD-10-CM | POA: Diagnosis not present

## 2024-01-07 DIAGNOSIS — H9203 Otalgia, bilateral: Secondary | ICD-10-CM | POA: Diagnosis not present

## 2024-01-09 ENCOUNTER — Ambulatory Visit: Payer: No Typology Code available for payment source | Admitting: Family Medicine

## 2024-01-09 ENCOUNTER — Encounter: Payer: Self-pay | Admitting: Family Medicine

## 2024-01-09 VITALS — BP 104/62 | HR 89 | Temp 97.5°F | Resp 18 | Ht 65.0 in | Wt 201.5 lb

## 2024-01-09 DIAGNOSIS — R011 Cardiac murmur, unspecified: Secondary | ICD-10-CM

## 2024-01-09 DIAGNOSIS — E038 Other specified hypothyroidism: Secondary | ICD-10-CM

## 2024-01-09 DIAGNOSIS — E7800 Pure hypercholesterolemia, unspecified: Secondary | ICD-10-CM | POA: Diagnosis not present

## 2024-01-09 DIAGNOSIS — R0789 Other chest pain: Secondary | ICD-10-CM | POA: Diagnosis not present

## 2024-01-09 DIAGNOSIS — E1165 Type 2 diabetes mellitus with hyperglycemia: Secondary | ICD-10-CM

## 2024-01-09 DIAGNOSIS — I10 Essential (primary) hypertension: Secondary | ICD-10-CM | POA: Diagnosis not present

## 2024-01-09 DIAGNOSIS — R0609 Other forms of dyspnea: Secondary | ICD-10-CM

## 2024-01-09 LAB — CBC WITH DIFFERENTIAL/PLATELET
Basophils Absolute: 0.1 10*3/uL (ref 0.0–0.1)
Basophils Relative: 0.7 % (ref 0.0–3.0)
Eosinophils Absolute: 0.3 10*3/uL (ref 0.0–0.7)
Eosinophils Relative: 3 % (ref 0.0–5.0)
HCT: 42.3 % (ref 36.0–46.0)
Hemoglobin: 13.7 g/dL (ref 12.0–15.0)
Lymphocytes Relative: 14.1 % (ref 12.0–46.0)
Lymphs Abs: 1.6 10*3/uL (ref 0.7–4.0)
MCHC: 32.4 g/dL (ref 30.0–36.0)
MCV: 90.8 fl (ref 78.0–100.0)
Monocytes Absolute: 1.2 10*3/uL — ABNORMAL HIGH (ref 0.1–1.0)
Monocytes Relative: 10.2 % (ref 3.0–12.0)
Neutro Abs: 8.3 10*3/uL — ABNORMAL HIGH (ref 1.4–7.7)
Neutrophils Relative %: 72 % (ref 43.0–77.0)
Platelets: 316 10*3/uL (ref 150.0–400.0)
RBC: 4.66 Mil/uL (ref 3.87–5.11)
RDW: 14.9 % (ref 11.5–15.5)
WBC: 11.5 10*3/uL — ABNORMAL HIGH (ref 4.0–10.5)

## 2024-01-09 LAB — HEMOGLOBIN A1C: Hgb A1c MFr Bld: 6.7 % — ABNORMAL HIGH (ref 4.6–6.5)

## 2024-01-09 LAB — TSH: TSH: 3.06 u[IU]/mL (ref 0.35–5.50)

## 2024-01-09 NOTE — Progress Notes (Signed)
 Subjective:     Patient ID: Monica Rogers, female    DOB: [birth year], 73 y.o.   MRN: [id number]  Chief Complaint  Patient presents with   Medical Management of Chronic Issues    6 month follow-up Not fasting    HPI Hhdm Discussed the use of AI scribe software for clinical note transcription with the patient, who gave verbal consent to proceed.  History of Present Illness Monica Rogers is a 73 year old female who presents for follow-up of diabetes, hypertension, hyperlipidemia, and hypothyroidism.  She is managing her diabetes through diet and exercise but finds it challenging to adhere to her regimen due to her husband's recent illness. She is not on any medication for diabetes. Her hypertension is controlled with olmesartan  20 mg daily. She discontinued rosuvastatin  10 mg for hyperlipidemia due to concerns about side effects, although she has not experienced any. Taking co Q 10 bid.  She takes Armour Thyroid  120 mg for hypothyroidism.  Her husband's illness, involving heart arrhythmia and COPD, has increased her responsibilities at home, affecting her ability to focus on her diet. She eats healthily but consumes too many carbohydrates and has lost about 9-10 pounds, possibly due to increased activity around the house.  She has been experiencing severe back pain for three to four months, diagnosed as a compressed disc with a possible fracture. She is under the care of an orthopedic doctor and takes prednisone 10 mg daily for pain management. The pain affects her daily activities, sometimes subsiding around 3-4 pm when the prednisone takes effect, but it returns later. She has not had any falls or injuries that could have caused the condition.  She experiences occasional chest pressure, particularly when lying down, but no radiation to the arm or associated shortness of breath. Her blood pressure readings at home have been low, around 101/75, and she reports occasional shortness of breath  during activities. She has a history of a high calcium  score of 150 from a CT scan in 2022, but no family history of heart disease.  She recently celebrated her 73th birthday and is planning to attend a party with friends. Her faith and support from friends help her cope with her current challenges.     Health Maintenance Due  Topic Date Due   OPHTHALMOLOGY EXAM  08/31/2023   Colonoscopy  12/13/2023   HEMOGLOBIN A1C  01/09/2024    Past Medical History:  Diagnosis Date   Allergy    Anemia    Arthritis    Blood transfusion without reported diagnosis    Breast cancer (HCC) 2024   R   Chronic headaches    Diverticulosis    Hyperlipidemia    Hypertension    Hypothyroidism    IBS (irritable bowel syndrome)    Prediabetes    Recurrent UTI     Past Surgical History:  Procedure Laterality Date   BREAST BIOPSY Right 2019   BREAST BIOPSY Right 12/25/2022   US  RT BREAST BX W LOC DEV 1ST LESION IMG BX SPEC US  GUIDE 12/25/2022 [facility]   BREAST BIOPSY Right 01/01/2023   US  RT BREAST BX W LOC DEV 1ST LESION IMG BX SPEC US  GUIDE 01/01/2023 [facility]   BREAST BIOPSY  02/25/2023   MM RT RADIOACTIVE SEED LOC MAMMO GUIDE 02/25/2023 [facility]   BREAST BIOPSY  02/25/2023   MM RT RADIOACTIVE SEED EA ADD LESION LOC MAMMO GUIDE 02/25/2023 [facility]   BREAST LUMPECTOMY WITH RADIOACTIVE SEED LOCALIZATION Right  02/26/2023   Procedure: RIGHT BREAST BRACKETED SEED LUMPECTOMY;  Surgeon: Sim Dryer, MD;  Location: [facility];  Service: General;  Laterality: Right;   IR ANGIOGRAM SELECTIVE EACH ADDITIONAL VESSEL  09/28/2020   IR ANGIOGRAM SELECTIVE EACH ADDITIONAL VESSEL  09/28/2020   IR ANGIOGRAM SELECTIVE EACH ADDITIONAL VESSEL  09/28/2020   IR ANGIOGRAM SELECTIVE EACH ADDITIONAL VESSEL  09/28/2020   IR ANGIOGRAM VISCERAL SELECTIVE  09/28/2020   IR ANGIOGRAM VISCERAL SELECTIVE  09/28/2020   IR EMBO ART  VEN HEMORR LYMPH EXTRAV  INC GUIDE ROADMAPPING   09/28/2020   IR US  GUIDE VASC ACCESS RIGHT  09/28/2020   TONSILLECTOMY       Current Outpatient Medications:    AMBULATORY NON FORMULARY MEDICATION, Bone Up 1 tablet in the morning and 1 in the afternoon, Disp: , Rfl:    ARMOUR THYROID  120 MG tablet, TAKE 1 TABLET BY MOUTH EVERY DAY, Disp: 90 tablet, Rfl: 4   Ascorbic Acid (VITAMIN C) 100 MG CHEW, , Disp: , Rfl:    B Complex Vitamins (VITAMIN B COMPLEX 100 IJ), , Disp: , Rfl:    Blood Glucose Monitoring Suppl (ONETOUCH VERIO FLEX SYSTEM) DEVI, USE TO TEST BLOOD SUGAR DAILY, Disp: , Rfl:    Butalbital -APAP-Caffeine  50-300-40 MG CAPS, Take 1 capsule by mouth every 6 (six) hours as needed., Disp: 14 capsule, Rfl: 0   CALCIUM  PO, Take 1 tablet by mouth daily., Disp: , Rfl:    Carboxymethylcellulose Sodium (EYE DROPS OP), Apply 2 drops to eye 2 (two) times daily as needed (dry eyes)., Disp: , Rfl:    Cholecalciferol (VITAMIN D3) 50 MCG (2000 UT) capsule, , Disp: , Rfl:    clotrimazole-betamethasone  (LOTRISONE) cream, Apply a small amount to affected area 1-2 times per day for 14 days, then stop and use as needed., Disp: , Rfl:    Coenzyme Q10 (COQ-10) 100 MG capsule, , Disp: , Rfl:    dexamethasone  (DECADRON ) 0.1 % ophthalmic solution, SMARTSIG:2 Drop(s) In Ear(s), Disp: , Rfl:    Digestive Enzymes (ENZYME DIGEST) CAPS, Take 2 capsules by mouth daily., Disp: , Rfl:    docusate sodium  (COLACE) 250 MG capsule, Take 1 capsule (250 mg total) by mouth daily. (Patient taking differently: Take 100 mg by mouth daily.), Disp: 10 capsule, Rfl: 0   glucose blood (ONETOUCH VERIO) test strip, Use as instructed, Disp: 100 each, Rfl: 2   Lancets (ONETOUCH DELICA PLUS LANCET30G) MISC, See admin instructions., Disp: , Rfl:    Magnesium 110 MG CAPS, Take 2 capsules by mouth 2 (two) times daily., Disp: , Rfl:    methocarbamol (ROBAXIN) 500 MG tablet, PLEASE SEE ATTACHED FOR DETAILED DIRECTIONS, Disp: , Rfl:    naproxen (NAPROSYN) 500 MG tablet, Take 500 mg by mouth  2 (two) times daily with a meal., Disp: , Rfl:    olmesartan  (BENICAR ) 20 MG tablet, Take 1 tablet (20 mg total) by mouth daily., Disp: 90 tablet, Rfl: 3   Omega-3 Fatty Acids (FISH OIL PO), Take 1 tablet by mouth 2 (two) times daily., Disp: , Rfl:    ondansetron  (ZOFRAN -ODT) 4 MG disintegrating tablet, Take 1 tablet (4 mg total) by mouth every 8 (eight) hours as needed for nausea or vomiting., Disp: 20 tablet, Rfl: 1   Polyethylene Glycol 3350  (MIRALAX  PO), Take by mouth as needed., Disp: , Rfl:    predniSONE (DELTASONE) 10 MG tablet, TAKE 1 DOSE PK BY ORAL ROUTE AS DIRECTED FOR 6 DAYS., Disp: , Rfl:    psyllium (  METAMUCIL) 58.6 % packet, Take 1 packet by mouth 2 (two) times daily., Disp: , Rfl:    QUERCETIN PO, Take 2 tablets by mouth 3 (three) times daily., Disp: , Rfl:    rosuvastatin  (CRESTOR ) 10 MG tablet, Take 1 tablet (10 mg total) by mouth daily., Disp: 90 tablet, Rfl: 0   Testosterone  1.62 % GEL, , Disp: , Rfl:    UNABLE TO FIND, Take 4 capsules by mouth daily. Med Name: ZYFLAMEND HERBAL PAIN RELIEF, Disp: , Rfl:    Zinc Sulfate 66 MG TABS, Take 132 mg by mouth daily., Disp: , Rfl:   Allergies  Allergen Reactions   Bacid Cough   Timothy Grass Pollen Allergen Other (See Comments) and Cough   Wheat Cough   ROS neg/noncontributory except as noted HPI/below      Objective:      BP 104/62 (BP Location: Left Arm, Patient Position: Sitting, Cuff Size: Large)   Pulse 89   Temp (!) 97.5 F (36.4 C) (Temporal)   Resp 18   Ht 5\' 5"  (1.651 m)   Wt 201 lb 8 oz (91.4 kg)   SpO2 93%   BMI 33.53 kg/m  Wt Readings from Last 3 Encounters:  01/09/24 201 lb 8 oz (91.4 kg)  12/10/23 198 lb 12.8 oz (90.2 kg)  09/20/23 197 lb (89.4 kg)    Physical Exam   Gen: WDWN NAD HEENT: NCAT, conjunctiva not injected, sclera nonicteric NECK:  supple, no thyromegaly, no nodes, no carotid bruits CARDIAC: RRR, S1S2+, +1/6 murmur. DP 2+B LUNGS: CTAB. No wheezes ABDOMEN:  BS+, soft, NTND, No HSM,  no masses EXT:  no edema MSK: no gross abnormalities.  NEURO: A&O x3.  CN II-XII intact.  PSYCH: normal mood. Good eye contact     Assessment & Plan:  Type 2 diabetes mellitus with hyperglycemia, without long-term current use of insulin  (HCC) -     Comprehensive metabolic panel with GFR -     Hemoglobin A1c  Primary hypertension -     Comprehensive metabolic panel with GFR -     CBC with Differential/Platelet  Pure hypercholesterolemia -     Lipid panel -     Comprehensive metabolic panel with GFR  Other specified hypothyroidism -     TSH  Atypical chest pain -     ECHOCARDIOGRAM COMPLETE; Future -     Ambulatory referral to Cardiology  Murmur, cardiac -     ECHOCARDIOGRAM COMPLETE; Future -     Ambulatory referral to Cardiology  DOE (dyspnea on exertion) -     ECHOCARDIOGRAM COMPLETE; Future -     Ambulatory referral to Cardiology  Assessment and Plan Assessment & Plan Compression Fracture of L4 Vertebra   Chronic back pain persists for 3-4 months due to an L4 vertebra compression fracture, with recent increased pain despite initial improvement. She is on prednisone 10 mg daily for pain management and is under the care of an orthopedic specialist. An x-ray is scheduled to further assess the condition. She is hesitant about vertebroplasty due to concerns about procedural pain. Continue prednisone 10 mg daily, obtain a lumbar spine x-ray, and consult with the orthopedic specialist regarding treatment options. Consider vertebroplasty if recommended.  Hypertension   Long-standing hypertension is managed with olmesartan  20 mg daily. Recent office blood pressure readings are low, with a reading of 93/59 mmHg. She reports occasional shortness of breath and chest pressure, possibly related to low blood pressure or other cardiac issues. The low blood pressure is  unexpected given her current pain levels. Monitor blood pressure at home 2-3 times per week and re-evaluate management  based on home readings. Consider a cardiology referral if symptoms persist.  Hyperlipidemia   Hyperlipidemia was previously managed with rosuvastatin  10 mg, which she discontinued due to concerns about side effects. Despite no family history of heart disease, a calcium  score of 150 indicates some coronary artery disease risk. Consider restarting rosuvastatin  after discussing risks and benefits, as she is concerned about potential side effects. Consider a cardiology referral for further cardiovascular risk evaluation.  Type 2 Diabetes Mellitus   Type 2 diabetes is managed with diet and exercise. She reports difficulty adhering to dietary recommendations due to personal circumstances, including caring for an ill spouse, and acknowledges consuming too many carbohydrates. She is concerned about potential increases in blood glucose levels and is not on any diabetes medication. Encourage dietary modifications to reduce carbohydrate intake, reinforce the importance of regular exercise, and monitor blood glucose levels.  Hypothyroidism   Hypothyroidism is well-managed with Armour Thyroid  120 mg daily. Continue the current medication regimen.    Return in about 6 months (around 07/10/2024).  Ellsworth Haas, MD

## 2024-01-09 NOTE — Patient Instructions (Signed)
 It was very nice to see you today!  Echo Referred to Card  Check bp 2-3x/wk and let me know in 2-3 wks.    PLEASE NOTE:  If you had any lab tests please let us  know if you have not heard back within a few days. You may see your results on MyChart before we have a chance to review them but we will give you a call once they are reviewed by us . If we ordered any referrals today, please let us  know if you have not heard from their office within the next week.   Please try these tips to maintain a healthy lifestyle:  Eat most of your calories during the day when you are active. Eliminate processed foods including packaged sweets (pies, cakes, cookies), reduce intake of potatoes, white bread, white pasta, and white rice. Look for whole grain options, oat flour or almond flour.  Each meal should contain half fruits/vegetables, one quarter protein, and one quarter carbs (no bigger than a computer mouse).  Cut down on sweet beverages. This includes juice, soda, and sweet tea. Also watch fruit intake, though this is a healthier sweet option, it still contains natural sugar! Limit to 3 servings daily.  Drink at least 1 glass of water with each meal and aim for at least 8 glasses per day  Exercise at least 150 minutes every week.

## 2024-01-10 DIAGNOSIS — M791 Myalgia, unspecified site: Secondary | ICD-10-CM | POA: Diagnosis not present

## 2024-01-10 DIAGNOSIS — M4856XD Collapsed vertebra, not elsewhere classified, lumbar region, subsequent encounter for fracture with routine healing: Secondary | ICD-10-CM | POA: Diagnosis not present

## 2024-01-10 DIAGNOSIS — M5451 Vertebrogenic low back pain: Secondary | ICD-10-CM | POA: Diagnosis not present

## 2024-01-10 LAB — COMPREHENSIVE METABOLIC PANEL WITH GFR
ALT: 30 U/L (ref 0–35)
AST: 23 U/L (ref 0–37)
Albumin: 4.5 g/dL (ref 3.5–5.2)
Alkaline Phosphatase: 63 U/L (ref 39–117)
BUN: 22 mg/dL (ref 6–23)
CO2: 25 meq/L (ref 19–32)
Calcium: 10.5 mg/dL (ref 8.4–10.5)
Chloride: 100 meq/L (ref 96–112)
Creatinine, Ser: 0.68 mg/dL (ref 0.40–1.20)
GFR: 85.44 mL/min (ref >60.00)
Glucose, Bld: 114 mg/dL — ABNORMAL HIGH (ref 70–99)
Potassium: 3.9 meq/L (ref 3.5–5.1)
Sodium: 139 meq/L (ref 135–145)
Total Bilirubin: 0.4 mg/dL (ref 0.2–1.2)
Total Protein: 7.5 g/dL (ref 6.0–8.3)

## 2024-01-10 LAB — LIPID PANEL
Cholesterol: 218 mg/dL — ABNORMAL HIGH (ref 0–200)
HDL: 54.6 mg/dL (ref >39.00)
LDL Cholesterol: 111 mg/dL — ABNORMAL HIGH (ref 0–99)
NonHDL: 163.73
Total CHOL/HDL Ratio: 4
Triglycerides: 264 mg/dL — ABNORMAL HIGH (ref 0.0–149.0)
VLDL: 52.8 mg/dL — ABNORMAL HIGH (ref 0.0–40.0)

## 2024-01-12 ENCOUNTER — Ambulatory Visit: Payer: Self-pay | Admitting: Family Medicine

## 2024-01-12 NOTE — Progress Notes (Signed)
 1.  Cholesterol is elevated/unchanged.  D/t DM and the elevated calcium  score done in past, she should start the rosuvastatin -is she willing? 2.  Wbc a little elevated which is probably from the prednisone-have her sch f/u w/me in 3 month to reck labs(DM,chol as well) 3.  Sugars a little worse, still at goal.  Understand all the stress, etc, but try to improve diet and do a little exercise.  I don't think she needs meds but I'm happy to do if she wants.

## 2024-01-13 NOTE — Telephone Encounter (Signed)
-----   Message from Ellsworth Haas sent at 01/12/2024  4:48 PM EDT ----- 1.  Cholesterol is elevated/unchanged.  D/t DM and the elevated calcium  score done in past, she should start the rosuvastatin -is she willing? 2.  Wbc a little elevated which is probably from the prednisone-have her sch f/u w/me in 3 month to reck labs(DM,chol as well) 3.  Sugars a little worse, still at goal.  Understand all the stress, etc, but try to improve diet and do a little exercise.  I don't think she needs meds but I'm happy to do if she wants.

## 2024-01-13 NOTE — Telephone Encounter (Signed)
 Called and spoke with pt about recent lab results and instructions per Dr Waldo Guitar. Pt acknowledged understanding.

## 2024-01-16 DIAGNOSIS — Z008 Encounter for other general examination: Secondary | ICD-10-CM | POA: Diagnosis not present

## 2024-01-16 DIAGNOSIS — R252 Cramp and spasm: Secondary | ICD-10-CM | POA: Diagnosis not present

## 2024-01-20 ENCOUNTER — Ambulatory Visit (INDEPENDENT_AMBULATORY_CARE_PROVIDER_SITE_OTHER): Admitting: Family

## 2024-01-20 VITALS — BP 122/76 | HR 68 | Temp 97.4°F | Ht 64.5 in | Wt 203.2 lb

## 2024-01-20 DIAGNOSIS — R252 Cramp and spasm: Secondary | ICD-10-CM

## 2024-01-20 NOTE — Progress Notes (Signed)
 Patient ID: Monica Rogers, female    DOB: 01-04-1951, 73 y.o.   MRN: [id number]  Chief Complaint  Patient presents with   Knee Pain    Legs cramp form knees to feet with a lot of pain. 10 on pain scale. Started happening months ago and has got worst. Started taking more magnessium glyconate and that helps a bit. Hasn't discussed with nay specialist or anything about this issue.   Discussed the use of AI scribe software for clinical note transcription with the patient, who gave verbal consent to proceed.  History of Present Illness Monica Rogers is a 72 year old female who presents with severe nocturnal leg cramps.  She experiences severe cramping pain in her legs from the knees to the feet, primarily at night, waking her around 1:30 AM. The pain is intense enough to cause falls and temporary immobility, lasting five to six minutes. She has tried remedies such as leg rubbing, mustard, bananas, electrolytes, and increased magnesium intake, with slight relief from doubling her magnesium glycinate dosage. She recalls a previous prescription for methocarbamol but is unsure of its use. She denies current diuretic use and recalls normal electrolytes on her last metabolic panel. Her room is cooler at night, which she prefers, but she wonders if it contributes to her symptoms. She has difficulty walking to the bathroom at night due to the cramps.  Assessment & Plan Leg cramps  Severe nocturnal leg cramps with low normal potassium. Magnesium supplementation provides partial relief. Cold temperature in room overnight may contribute. - Ensure hydration with at least 2 liters of water daily. - Continue magnesium supplementation, monitor for loose stools. - Apply heat to legs before bedtime for 20-19minutes and during cramps to relieve spasm. - Perform leg pumps and calf stretches before bedtime. - Consider a low dose OTC potassium supplement from a natural food store. - Use methocarbamol (Robaxin) at  night if available, 500-1,000mg . - Provided handout on managing leg cramps. - Call office if no improvement after another 1-2 weeks.    Subjective:    Outpatient Medications Prior to Visit  Medication Sig Dispense Refill   AMBULATORY NON FORMULARY MEDICATION Bone Up 1 tablet in the morning and 1 in the afternoon     ARMOUR THYROID  120 MG tablet TAKE 1 TABLET BY MOUTH EVERY DAY 90 tablet 4   Ascorbic Acid (VITAMIN C) 100 MG CHEW      B Complex Vitamins (VITAMIN B COMPLEX 100 IJ)      Blood Glucose Monitoring Suppl (ONETOUCH VERIO FLEX SYSTEM) DEVI USE TO TEST BLOOD SUGAR DAILY     CALCIUM  PO Take 1 tablet by mouth daily.     Carboxymethylcellulose Sodium (EYE DROPS OP) Apply 2 drops to eye 2 (two) times daily as needed (dry eyes).     Cholecalciferol (VITAMIN D3) 50 MCG (2000 UT) capsule      clotrimazole-betamethasone  (LOTRISONE) cream Apply a small amount to affected area 1-2 times per day for 14 days, then stop and use as needed.     Coenzyme Q10 (COQ-10) 100 MG capsule      dexamethasone  (DECADRON ) 0.1 % ophthalmic solution SMARTSIG:2 Drop(s) In Ear(s)     Digestive Enzymes (ENZYME DIGEST) CAPS Take 2 capsules by mouth daily.     docusate sodium  (COLACE) 250 MG capsule Take 1 capsule (250 mg total) by mouth daily. 10 capsule 0   Lancets (ONETOUCH DELICA PLUS LANCET30G) MISC See admin instructions.     Magnesium 110 MG CAPS  Take 2 capsules by mouth 2 (two) times daily.     methocarbamol (ROBAXIN) 500 MG tablet PLEASE SEE ATTACHED FOR DETAILED DIRECTIONS     naproxen (NAPROSYN) 500 MG tablet Take 500 mg by mouth 2 (two) times daily with a meal.     olmesartan  (BENICAR ) 20 MG tablet Take 1 tablet (20 mg total) by mouth daily. 90 tablet 3   Omega-3 Fatty Acids (FISH OIL PO) Take 1 tablet by mouth 2 (two) times daily.     predniSONE (DELTASONE) 10 MG tablet TAKE 1 DOSE PK BY ORAL ROUTE AS DIRECTED FOR 6 DAYS.     QUERCETIN PO Take 2 tablets by mouth 3 (three) times daily.     Testosterone   1.62 % GEL      UNABLE TO FIND Take 4 capsules by mouth daily. Med Name: ZYFLAMEND HERBAL PAIN RELIEF     Zinc Sulfate 66 MG TABS Take 132 mg by mouth daily.     Butalbital -APAP-Caffeine  50-300-40 MG CAPS Take 1 capsule by mouth every 6 (six) hours as needed. (Patient not taking: Reported on 01/20/2024) 14 capsule 0   glucose blood (ONETOUCH VERIO) test strip Use as instructed (Patient not taking: Reported on 01/20/2024) 100 each 2   ondansetron  (ZOFRAN -ODT) 4 MG disintegrating tablet Take 1 tablet (4 mg total) by mouth every 8 (eight) hours as needed for nausea or vomiting. (Patient not taking: Reported on 01/20/2024) 20 tablet 1   Polyethylene Glycol 3350  (MIRALAX  PO) Take by mouth as needed. (Patient not taking: Reported on 01/20/2024)     psyllium (METAMUCIL) 58.6 % packet Take 1 packet by mouth 2 (two) times daily. (Patient not taking: Reported on 01/20/2024)     rosuvastatin  (CRESTOR ) 10 MG tablet Take 1 tablet (10 mg total) by mouth daily. (Patient not taking: Reported on 01/20/2024) 90 tablet 0   No facility-administered medications prior to visit.   Past Medical History:  Diagnosis Date   Allergy    Anemia    Arthritis    Blood transfusion without reported diagnosis    Breast cancer (HCC) 2024   R   Chronic headaches    Diverticulosis    Hyperlipidemia    Hypertension    Hypothyroidism    IBS (irritable bowel syndrome)    Prediabetes    Recurrent UTI    Past Surgical History:  Procedure Laterality Date   BREAST BIOPSY Right 2019   BREAST BIOPSY Right 12/25/2022   US  RT BREAST BX W LOC DEV 1ST LESION IMG BX SPEC US  GUIDE 12/25/2022 [facility]   BREAST BIOPSY Right 01/01/2023   US  RT BREAST BX W LOC DEV 1ST LESION IMG BX SPEC US  GUIDE 01/01/2023 [facility]   BREAST BIOPSY  02/25/2023   MM RT RADIOACTIVE SEED LOC MAMMO GUIDE 02/25/2023 [facility]   BREAST BIOPSY  02/25/2023   MM RT RADIOACTIVE SEED EA ADD LESION LOC MAMMO GUIDE 02/25/2023 [facility]    BREAST LUMPECTOMY WITH RADIOACTIVE SEED LOCALIZATION Right 02/26/2023   Procedure: RIGHT BREAST BRACKETED SEED LUMPECTOMY;  Surgeon: Sim Dryer, MD;  Location: [facility];  Service: General;  Laterality: Right;   IR ANGIOGRAM SELECTIVE EACH ADDITIONAL VESSEL  09/28/2020   IR ANGIOGRAM SELECTIVE EACH ADDITIONAL VESSEL  09/28/2020   IR ANGIOGRAM SELECTIVE EACH ADDITIONAL VESSEL  09/28/2020   IR ANGIOGRAM SELECTIVE EACH ADDITIONAL VESSEL  09/28/2020   IR ANGIOGRAM VISCERAL SELECTIVE  09/28/2020   IR ANGIOGRAM VISCERAL SELECTIVE  09/28/2020   IR EMBO ART  VEN  HEMORR LYMPH EXTRAV  INC GUIDE ROADMAPPING  09/28/2020   IR US  GUIDE VASC ACCESS RIGHT  09/28/2020   TONSILLECTOMY     Allergies  Allergen Reactions   Bacid Cough   Timothy Grass Pollen Allergen Other (See Comments) and Cough   Wheat Cough      Objective:    Physical Exam Vitals and nursing note reviewed.  Constitutional:      Appearance: Normal appearance.   Cardiovascular:     Rate and Rhythm: Normal rate and regular rhythm.  Pulmonary:     Effort: Pulmonary effort is normal.     Breath sounds: Normal breath sounds.   Musculoskeletal:        General: Normal range of motion.   Skin:    General: Skin is warm and dry.   Neurological:     Mental Status: She is alert.   Psychiatric:        Mood and Affect: Mood normal.        Behavior: Behavior normal.    BP 122/76   Pulse 68   Temp (!) 97.4 F (36.3 C) (Temporal)   Ht 5' 4.5 (1.638 m)   Wt 203 lb 3.2 oz (92.2 kg)   SpO2 93%   BMI 34.34 kg/m  Wt Readings from Last 3 Encounters:  01/20/24 203 lb 3.2 oz (92.2 kg)  01/09/24 201 lb 8 oz (91.4 kg)  12/10/23 198 lb 12.8 oz (90.2 kg)       Versa Gore, NP

## 2024-01-20 NOTE — Patient Instructions (Signed)
 It was very nice to see you today!   For your leg cramps:  Be sure to get in 2 liters = 64oz = 8 cups of 8oz water daily, starting in the morning and stopping at evening dinner. Continue taking the magnesium glycinate, if you only take once a day, take at bedtime. Apply heat for about 20-30 minutes to your calves prior to bedtime. Stretch your calf muscles prior to bedtime. Ok to take over the counter potassium once a day.  Call the office back if above is not helping.      PLEASE NOTE:  If you had any lab tests please let us  know if you have not heard back within a few days. You may see your results on MyChart before we have a chance to review them but we will give you a call once they are reviewed by us . If we ordered any referrals today, please let us  know if you have not heard from their office within the next week.

## 2024-01-23 ENCOUNTER — Other Ambulatory Visit: Payer: Self-pay | Admitting: Family Medicine

## 2024-01-31 DIAGNOSIS — H903 Sensorineural hearing loss, bilateral: Secondary | ICD-10-CM | POA: Diagnosis not present

## 2024-02-11 ENCOUNTER — Ambulatory Visit (HOSPITAL_BASED_OUTPATIENT_CLINIC_OR_DEPARTMENT_OTHER)

## 2024-02-11 DIAGNOSIS — R0609 Other forms of dyspnea: Secondary | ICD-10-CM | POA: Diagnosis not present

## 2024-02-11 DIAGNOSIS — R0789 Other chest pain: Secondary | ICD-10-CM

## 2024-02-11 DIAGNOSIS — R011 Cardiac murmur, unspecified: Secondary | ICD-10-CM

## 2024-02-11 LAB — ECHOCARDIOGRAM COMPLETE
Area-P 1/2: 3.48 cm2
S' Lateral: 1.65 cm

## 2024-02-11 NOTE — Progress Notes (Signed)
 Echo-some impaired relaxation of heart-needs to keep bp controlled.  Has she ever heard from Cardiology about referral/appt-please check

## 2024-02-13 ENCOUNTER — Other Ambulatory Visit: Payer: Self-pay | Admitting: Family Medicine

## 2024-02-13 DIAGNOSIS — E034 Atrophy of thyroid (acquired): Secondary | ICD-10-CM

## 2024-02-22 ENCOUNTER — Other Ambulatory Visit: Payer: Self-pay | Admitting: Family Medicine

## 2024-02-22 DIAGNOSIS — E034 Atrophy of thyroid (acquired): Secondary | ICD-10-CM

## 2024-02-25 DIAGNOSIS — Z6835 Body mass index (BMI) 35.0-35.9, adult: Secondary | ICD-10-CM | POA: Diagnosis not present

## 2024-02-25 DIAGNOSIS — N952 Postmenopausal atrophic vaginitis: Secondary | ICD-10-CM | POA: Diagnosis not present

## 2024-02-25 DIAGNOSIS — L900 Lichen sclerosus et atrophicus: Secondary | ICD-10-CM | POA: Diagnosis not present

## 2024-02-25 DIAGNOSIS — Z01419 Encounter for gynecological examination (general) (routine) without abnormal findings: Secondary | ICD-10-CM | POA: Diagnosis not present

## 2024-03-06 DIAGNOSIS — S32020A Wedge compression fracture of second lumbar vertebra, initial encounter for closed fracture: Secondary | ICD-10-CM | POA: Diagnosis not present

## 2024-03-06 DIAGNOSIS — M5451 Vertebrogenic low back pain: Secondary | ICD-10-CM | POA: Diagnosis not present

## 2024-03-06 DIAGNOSIS — M791 Myalgia, unspecified site: Secondary | ICD-10-CM | POA: Diagnosis not present

## 2024-03-17 DIAGNOSIS — M47816 Spondylosis without myelopathy or radiculopathy, lumbar region: Secondary | ICD-10-CM | POA: Diagnosis not present

## 2024-03-28 DIAGNOSIS — M5459 Other low back pain: Secondary | ICD-10-CM | POA: Diagnosis not present

## 2024-03-28 DIAGNOSIS — M5416 Radiculopathy, lumbar region: Secondary | ICD-10-CM | POA: Diagnosis not present

## 2024-04-03 ENCOUNTER — Ambulatory Visit: Payer: Self-pay

## 2024-04-03 ENCOUNTER — Ambulatory Visit: Payer: Self-pay | Admitting: Medical

## 2024-04-03 ENCOUNTER — Ambulatory Visit: Admitting: Medical

## 2024-04-03 VITALS — BP 122/80 | HR 63 | Temp 97.9°F | Resp 16 | Ht 64.5 in | Wt 209.2 lb

## 2024-04-03 DIAGNOSIS — N390 Urinary tract infection, site not specified: Secondary | ICD-10-CM

## 2024-04-03 DIAGNOSIS — R350 Frequency of micturition: Secondary | ICD-10-CM | POA: Diagnosis not present

## 2024-04-03 LAB — POCT URINALYSIS DIPSTICK
Bilirubin, UA: NEGATIVE
Blood, UA: NEGATIVE
Glucose, UA: NEGATIVE
Ketones, UA: NEGATIVE
Nitrite, UA: NEGATIVE
Protein, UA: NEGATIVE
Spec Grav, UA: 1.01 (ref 1.010–1.025)
Urobilinogen, UA: 0.2 U/dL
pH, UA: 6.5 (ref 5.0–8.0)

## 2024-04-03 MED ORDER — CIPROFLOXACIN HCL 500 MG PO TABS: 500.0000 mg | ORAL_TABLET | Freq: Two times a day (BID) | ORAL | 0 refills | Status: DC

## 2024-04-03 NOTE — Telephone Encounter (Signed)
 FYI Only or Action Required?: FYI only for provider.  Patient was last seen in primary care on 01/20/2024 by Lucius Krabbe, NP.  Called Nurse Triage reporting Dysuria.  Symptoms began several days ago.  Interventions attempted: Nothing.  Symptoms are: gradually worsening.  Triage Disposition: See Physician Within 24 Hours  Patient/caregiver understands and will follow disposition?: Yes                 Copied from CRM #[id number]. Topic: Clinical - Red Word Triage >> Apr 03, 2024  9:17 AM Adelita E wrote: [facility] that prompted transfer to Nurse Triage: UTI symptoms. Patient stated she has pain when urinating, burning, and discomfort throughout the day, going on for a couple of days. Reason for Disposition  Age > 50 years  Answer Assessment - Initial Assessment Questions 1. SEVERITY: How bad is the pain?  (e.g., Scale 1-10; mild, moderate, or severe)     5/10 2. FREQUENCY: How many times have you had painful urination today?      Cramping and burning 3. PATTERN: Is pain present every time you urinate or just sometimes?      Each time 4. ONSET: When did the painful urination start?      A  couple of days 5. FEVER: Do you have a fever? If Yes, ask: What is your temperature, how was it measured, and when did it start?     no 6. PAST UTI: Have you had a urine infection before? If Yes, ask: When was the last time? and What happened that time?      yes 7. CAUSE: What do you think is causing the painful urination?  (e.g., UTI, scratch, Herpes sore)     UTI 8. OTHER SYMPTOMS: Do you have any other symptoms? (e.g., blood in urine, flank pain, genital sores, urgency, vaginal discharge)     urgency  Protocols used: Urination Pain - Female-A-AH

## 2024-04-03 NOTE — Patient Instructions (Signed)
 Urinary tract infection likely with urinary frequency and dysuria Recurrent UTIs.. Previous cultures showed Klebsiella resistant to some antibiotics. Current urinalysis indicates infection with large leukocytes and cloudy urine. Likely UTI pending culture results. - Prescribed ciprofloxacin  500 mg twice daily for 7 days. - Discussed risk of C. diff with ciprofloxacin  and benefits of its potency. She prefers a potent antibiotic to avoid recurrence. - Advised increased intake of probiotic-rich foods and continuation of probiotic tablets. - Encouraged adequate hydration. - Update on culture results via MyChart or phone call if resistance is detected.  Follow up date to be determined after study result or sooner if needed

## 2024-04-03 NOTE — Progress Notes (Signed)
 Subjective:    Patient ID: [name], female    DOB: 1951-05-20, 73 y.o.   MRN: [id number]  HPI Monica Rogers is a 73 year old female with recurrent urinary tract infections who presents with urinary frequency and cramping.  She has been experiencing urinary frequency and cramping for the past three days. The cramping is described as a pressure over the bladder that intensifies after urination, accompanied by a burning sensation.  She has a history of recurrent urinary tract infections, previously experiencing eight to nine infections annually, which has decreased to two to three infections per year with the use of estrogen cream. Her last UTI was three months ago, treated with antibiotics after initial resistance was noted. The bacteria identified was Klebsiella, and she was eventually treated successfully with a different antibiotic.  She has been on ciprofloxacin  and a sulfa -based antibiotic in the past. During her last infection, she was initially prescribed Cefalexin, which was not effective, and then switched to another antibiotic. She recalls being on ciprofloxacin  multiple times in the past.  She takes a probiotic daily. She is concerned about dehydration and the upcoming three-day weekend, emphasizing her desire for effective treatment to avoid returning for further care.   Review of Systems  Constitutional:  Negative for chills and fever.  Respiratory:  Negative for chest tightness, shortness of breath and wheezing.   Cardiovascular:  Negative for chest pain and palpitations.  Gastrointestinal:  Negative for abdominal pain, diarrhea and nausea.  Genitourinary:  Positive for dysuria and frequency. Negative for pelvic pain and urgency.  Musculoskeletal:  Negative for back pain and myalgias.  Neurological:  Negative for dizziness, weakness and light-headedness.  Hematological:  Negative for adenopathy. Does not bruise/bleed easily.  Psychiatric/Behavioral:  Negative for  behavioral problems and confusion.     Past Medical History:  Diagnosis Date   Allergy    Anemia    Arthritis    Blood transfusion without reported diagnosis    Breast cancer (HCC) 2024   R   Chronic headaches    Diverticulosis    Hyperlipidemia    Hypertension    Hypothyroidism    IBS (irritable bowel syndrome)    Prediabetes    Recurrent UTI      Social History   Socioeconomic History   Marital status: Married    Spouse name: Not on file   Number of children: 3   Years of education: Not on file   Highest education level: Master's degree (e.g., MA, MS, MEng, MEd, MSW, MBA)  Occupational History   Occupation: Artist  Tobacco Use   Smoking status: Never   Smokeless tobacco: Never  Vaping Use   Vaping status: Never Used  Substance and Sexual Activity   Alcohol use: No   Drug use: No   Sexual activity: Not Currently    Birth control/protection: None  Other Topics Concern   Not on file  Social History Narrative   1 grand      Retired Runner, broadcasting/film/video.   Artist now   Social Drivers of Corporate investment banker Strain: Low Risk  (09/19/2023)   Overall Financial Resource Strain (CARDIA)    Difficulty of Paying Living Expenses: Not hard at all  Recent Concern: Financial Resource Strain - Medium Risk (07/10/2023)   Overall Financial Resource Strain (CARDIA)    Difficulty of Paying Living Expenses: Somewhat hard  Food Insecurity: Low Risk  (01/07/2024)   Received from [facility]   Hunger Vital Sign  Within the past 12 months, you worried that your food would run out before you got money to buy more: Never true    Within the past 12 months, the food you bought just didn't last and you didn't have money to get more. : Never true  Transportation Needs: No Transportation Needs (01/07/2024)   Received from [facility]    In the past 12 months, has lack of reliable transportation kept you from medical appointments, meetings, work or from getting things  needed for daily living? : No  Physical Activity: Insufficiently Active (09/19/2023)   Exercise Vital Sign    Days of Exercise per Week: 5 days    Minutes of Exercise per Session: 20 min  Stress: No Stress Concern Present (09/19/2023)   [employer] of Occupational Health - Occupational Stress Questionnaire    Feeling of Stress : Not at all  Social Connections: Socially Integrated (09/19/2023)   Social Connection and Isolation Panel    Frequency of Communication with Friends and Family: Three times a week    Frequency of Social Gatherings with Friends and Family: Twice a week    Attends Religious Services: More than 4 times per year    Active Member of Clubs or Organizations: Yes    Attends Banker Meetings: 1 to 4 times per year    Marital Status: Married  Catering manager Violence: Not At Risk (05/20/2023)   Humiliation, Afraid, Rape, and Kick questionnaire    Fear of Current or Ex-Partner: No    Emotionally Abused: No    Physically Abused: No    Sexually Abused: No    Past Surgical History:  Procedure Laterality Date   BREAST BIOPSY Right 2019   BREAST BIOPSY Right 12/25/2022   US  RT BREAST BX W LOC DEV 1ST LESION IMG BX SPEC US  GUIDE 12/25/2022 [facility]   BREAST BIOPSY Right 01/01/2023   US  RT BREAST BX W LOC DEV 1ST LESION IMG BX SPEC US  GUIDE 01/01/2023 [facility]   BREAST BIOPSY  02/25/2023   MM RT RADIOACTIVE SEED LOC MAMMO GUIDE 02/25/2023 [facility]   BREAST BIOPSY  02/25/2023   MM RT RADIOACTIVE SEED EA ADD LESION LOC MAMMO GUIDE 02/25/2023 [facility]   BREAST LUMPECTOMY WITH RADIOACTIVE SEED LOCALIZATION Right 02/26/2023   Procedure: RIGHT BREAST BRACKETED SEED LUMPECTOMY;  Surgeon: Vanderbilt Ned, MD;  Location: [facility];  Service: General;  Laterality: Right;   IR ANGIOGRAM SELECTIVE EACH ADDITIONAL VESSEL  09/28/2020   IR ANGIOGRAM SELECTIVE EACH ADDITIONAL VESSEL  09/28/2020   IR ANGIOGRAM SELECTIVE EACH  ADDITIONAL VESSEL  09/28/2020   IR ANGIOGRAM SELECTIVE EACH ADDITIONAL VESSEL  09/28/2020   IR ANGIOGRAM VISCERAL SELECTIVE  09/28/2020   IR ANGIOGRAM VISCERAL SELECTIVE  09/28/2020   IR EMBO ART  VEN HEMORR LYMPH EXTRAV  INC GUIDE ROADMAPPING  09/28/2020   IR US  GUIDE VASC ACCESS RIGHT  09/28/2020   TONSILLECTOMY      Family History  Problem Relation Age of Onset   Hearing loss Mother    Miscarriages / [place] Mother    Alcohol abuse Father    Hearing loss Father    Hypertension Father    Diabetes Brother    Cancer Maternal Grandmother        possibly had breast cancer dx. in her 30s   Diabetes Paternal Grandmother     Allergies  Allergen Reactions   Bacid Cough   Timothy Grass Pollen Allergen Other (See Comments)  and Cough   Wheat Cough    Current Outpatient Medications on File Prior to Visit  Medication Sig Dispense Refill   AMBULATORY NON FORMULARY MEDICATION Bone Up 1 tablet in the morning and 1 in the afternoon     ARMOUR THYROID  120 MG tablet TAKE 1 TABLET BY MOUTH EVERY DAY 30 tablet 14   Ascorbic Acid (VITAMIN C) 100 MG CHEW      B Complex Vitamins (VITAMIN B COMPLEX 100 IJ)      Blood Glucose Monitoring Suppl (ONETOUCH VERIO FLEX SYSTEM) DEVI USE TO TEST BLOOD SUGAR DAILY     CALCIUM  PO Take 1 tablet by mouth daily.     Carboxymethylcellulose Sodium (EYE DROPS OP) Apply 2 drops to eye 2 (two) times daily as needed (dry eyes).     Cholecalciferol (VITAMIN D3) 50 MCG (2000 UT) capsule      clotrimazole-betamethasone  (LOTRISONE) cream Apply a small amount to affected area 1-2 times per day for 14 days, then stop and use as needed.     Coenzyme Q10 (COQ-10) 100 MG capsule      dexamethasone  (DECADRON ) 0.1 % ophthalmic solution SMARTSIG:2 Drop(s) In Ear(s)     Digestive Enzymes (ENZYME DIGEST) CAPS Take 2 capsules by mouth daily.     docusate sodium  (COLACE) 250 MG capsule Take 1 capsule (250 mg total) by mouth daily. 10 capsule 0   Lancets (ONETOUCH DELICA PLUS  LANCET30G) MISC See admin instructions.     Magnesium 110 MG CAPS Take 2 capsules by mouth 2 (two) times daily.     methocarbamol (ROBAXIN) 500 MG tablet PLEASE SEE ATTACHED FOR DETAILED DIRECTIONS     naproxen (NAPROSYN) 500 MG tablet Take 500 mg by mouth 2 (two) times daily with a meal.     olmesartan  (BENICAR ) 20 MG tablet TAKE 1 TABLET BY MOUTH EVERY DAY 90 tablet 3   Omega-3 Fatty Acids (FISH OIL PO) Take 1 tablet by mouth 2 (two) times daily.     QUERCETIN PO Take 2 tablets by mouth 3 (three) times daily.     rosuvastatin  (CRESTOR ) 10 MG tablet TAKE 1 TABLET BY MOUTH EVERY DAY 90 tablet 0   Testosterone  1.62 % GEL      UNABLE TO FIND Take 4 capsules by mouth daily. Med Name: ZYFLAMEND HERBAL PAIN RELIEF     Zinc Sulfate 66 MG TABS Take 132 mg by mouth daily.     No current facility-administered medications on file prior to visit.    BP 122/80   Pulse 63   Temp 97.9 F (36.6 C) (Oral)   Resp 16   Ht 5' 4.5 (1.638 m)   Wt 209 lb 3.2 oz (94.9 kg)   SpO2 94%   BMI 35.35 kg/m        Objective:   Physical Exam  General Mental Status- Alert. General Appearance- Not in acute distress.   Skin General: Color- Normal Color. Moisture- Normal Moisture.  Neck  No carotid bruits. No JVD.  Chest and Lung Exam Auscultation: Breath Sounds:-CTA  Cardiovascular Auscultation:Rythm- RRR Murmurs & Other Heart Sounds:Auscultation of the heart reveals- No Murmurs.  Abdomen Inspection:-Inspeection Normal. Palpation/Percussion:Note:No mass. Palpation and Percussion of the abdomen reveal- Non Tender, Non Distended + BS, no rebound or guarding. Mild suprapubic tenderness   Neurologic Cranial Nerve exam:- CN III-XII intact(No nystagmus), symmetric smile. Strength:- 5/5 equal and symmetric strength both upper and lower extremities.       Assessment & Plan:   Patient Instructions  Urinary  tract infection likely with urinary frequency and dysuria Recurrent UTIs.. Previous  cultures showed Klebsiella resistant to some antibiotics. Current urinalysis indicates infection with large leukocytes and cloudy urine. Likely UTI pending culture results. - Prescribed ciprofloxacin  500 mg twice daily for 7 days. - Discussed risk of C. diff with ciprofloxacin  and benefits of its potency. She prefers a potent antibiotic to avoid recurrence. - Advised increased intake of probiotic-rich foods and continuation of probiotic tablets. - Encouraged adequate hydration. - Update on culture results via MyChart or phone call if resistance is detected.  Follow up date to be determined after study result or sooner if needed   Galvin Aversa, PA-C

## 2024-04-05 LAB — URINE CULTURE
MICRO NUMBER:: 16902316
SPECIMEN QUALITY:: ADEQUATE

## 2024-04-10 ENCOUNTER — Other Ambulatory Visit: Payer: Self-pay | Admitting: Family Medicine

## 2024-04-10 DIAGNOSIS — E034 Atrophy of thyroid (acquired): Secondary | ICD-10-CM

## 2024-04-10 MED ORDER — ROSUVASTATIN CALCIUM 10 MG PO TABS: 10.0000 mg | ORAL_TABLET | Freq: Every day | ORAL | 0 refills | Status: DC

## 2024-04-10 NOTE — Progress Notes (Signed)
 Got request from [facility] health plan to fill meds.  sent

## 2024-04-16 DIAGNOSIS — M5416 Radiculopathy, lumbar region: Secondary | ICD-10-CM | POA: Diagnosis not present

## 2024-05-11 NOTE — Progress Notes (Signed)
 Monica Rogers                                          MRN: [id number]   05/11/2024   The [facility] Quality Team Specialist reviewed this patient medical record for the purposes of chart review for care gap closure. The following were reviewed: chart review for care gap closure-kidney health evaluation for diabetes:eGFR  and uACR.    [facility] Quality Team

## 2024-05-12 DIAGNOSIS — L400 Psoriasis vulgaris: Secondary | ICD-10-CM | POA: Diagnosis not present

## 2024-05-25 ENCOUNTER — Ambulatory Visit: Payer: No Typology Code available for payment source

## 2024-05-25 VITALS — Ht 64.5 in | Wt 209.0 lb

## 2024-05-25 DIAGNOSIS — Z0000 Encounter for general adult medical examination without abnormal findings: Secondary | ICD-10-CM

## 2024-05-25 NOTE — Progress Notes (Signed)
 Subjective:   Monica Rogers is a 73 y.o. who presents for a Medicare Wellness preventive visit.  As a reminder, Annual Wellness Visits don't include a physical exam, and some assessments may be limited, especially if this visit is performed virtually. We may recommend an in-person follow-up visit with your provider if needed.  Visit Complete: Virtual I connected with  Monica Rogers on 05/25/24 by a video and audio enabled telemedicine application and verified that I am speaking with the correct person using two identifiers.  Patient Location: Home  Provider Location: Office/Clinic  I discussed the limitations of evaluation and management by telemedicine. The patient expressed understanding and agreed to proceed.  Vital Signs: Because this visit was a virtual/telehealth visit, some criteria may be missing or patient reported. Any vitals not documented were not able to be obtained and vitals that have been documented are patient reported.    Persons Participating in Visit: Patient.  AWV Questionnaire: Yes: Patient Medicare AWV questionnaire was completed by the patient on 05/24/24; I have confirmed that all information answered by patient is correct and no changes since this date.  Cardiac Risk Factors include: advanced age (>79men, >64 women);dyslipidemia;diabetes mellitus;obesity (BMI >30kg/m2);hypertension     Objective:    Today's Vitals   05/24/24 1531 05/25/24 1504  Weight:  209 lb (94.8 kg)  Height:  5' 4.5 (1.638 m)  PainSc: 0-No pain    Body mass index is 35.32 kg/m.     05/25/2024    3:13 PM 05/20/2023    3:14 PM 04/03/2023   10:39 AM 03/07/2023   11:05 AM 02/26/2023    9:26 AM 02/14/2023    2:08 PM 01/09/2023    5:35 PM  Advanced Directives  Does Patient Have a Medical Advance Directive? Yes Yes Yes Yes Yes Yes Yes  Type of Estate agent of [place];Living will Healthcare Power of [place];Living will Living will;Healthcare Power of [employer] Power of [place];Living will Healthcare Power of [place];Living will Living will;Healthcare Power of [employer] Power of [place];Living will  Does patient want to make changes to medical advance directive?    No - Patient declined No - Patient declined No - Patient declined No - Patient declined  Copy of Healthcare Power of Attorney in Chart? No - copy requested No - copy requested  No - copy requested  No - copy requested No - copy requested  Would patient like information on creating a medical advance directive?    No - Patient declined No - Patient declined No - Patient declined     Current Medications (verified) Outpatient Encounter Medications as of 05/25/2024  Medication Sig   AMBULATORY NON FORMULARY MEDICATION Bone Up 1 tablet in the morning and 1 in the afternoon   ARMOUR THYROID  120 MG tablet TAKE 1 TABLET BY MOUTH EVERY DAY   Ascorbic Acid (VITAMIN C) 100 MG CHEW    B Complex Vitamins (VITAMIN B COMPLEX 100 IJ)    Blood Glucose Monitoring Suppl (ONETOUCH VERIO FLEX SYSTEM) DEVI USE TO TEST BLOOD SUGAR DAILY   CALCIUM  PO Take 1 tablet by mouth daily.   Carboxymethylcellulose Sodium (EYE DROPS OP) Apply 2 drops to eye 2 (two) times daily as needed (dry eyes).   Cholecalciferol (VITAMIN D3) 50 MCG (2000 UT) capsule    clotrimazole-betamethasone  (LOTRISONE) cream Apply a small amount to affected area 1-2 times per day for 14 days, then stop and use as needed.   Coenzyme Q10 (COQ-10) 100 MG capsule  dexamethasone  (DECADRON ) 0.1 % ophthalmic solution SMARTSIG:2 Drop(s) In Ear(s)   Digestive Enzymes (ENZYME DIGEST) CAPS Take 2 capsules by mouth daily.   docusate sodium  (COLACE) 250 MG capsule Take 1 capsule (250 mg total) by mouth daily.   Lancets (ONETOUCH DELICA PLUS LANCET30G) MISC See admin instructions.   Magnesium 110 MG CAPS Take 2 capsules by mouth 2 (two) times daily.   methocarbamol (ROBAXIN) 500 MG tablet PLEASE SEE ATTACHED FOR DETAILED DIRECTIONS    naproxen (NAPROSYN) 500 MG tablet Take 500 mg by mouth 2 (two) times daily with a meal.   olmesartan  (BENICAR ) 20 MG tablet TAKE 1 TABLET BY MOUTH EVERY DAY   Omega-3 Fatty Acids (FISH OIL PO) Take 1 tablet by mouth 2 (two) times daily.   QUERCETIN PO Take 2 tablets by mouth 3 (three) times daily.   rosuvastatin  (CRESTOR ) 10 MG tablet Take 1 tablet (10 mg total) by mouth daily.   Testosterone  1.62 % GEL    UNABLE TO FIND Take 4 capsules by mouth daily. Med Name: ZYFLAMEND HERBAL PAIN RELIEF   Zinc Sulfate 66 MG TABS Take 132 mg by mouth daily.   estradiol  (ESTRACE ) 0.01 % CREA vaginal cream PLACE 0.5 G VAGINALLY 2 (TWO) TIMES A WEEK. PLACE 0.5G NIGHTLY FOR TWO WEEKS THEN TWICE A WEEK AFTER   [DISCONTINUED] ciprofloxacin  (CIPRO ) 500 MG tablet Take 1 tablet (500 mg total) by mouth 2 (two) times daily.   No facility-administered encounter medications on file as of 05/25/2024.    Allergies (verified) Bacid, Grass pollen([id number]-o-[id number]-swt vern), Timothy grass pollen allergen, and Wheat   History: Past Medical History:  Diagnosis Date   Allergy    Anemia    Arthritis    Blood transfusion without reported diagnosis    Breast cancer (HCC) 2024   R   Chronic headaches    Diverticulosis    Hyperlipidemia    Hypertension    Hypothyroidism    IBS (irritable bowel syndrome)    Prediabetes    Recurrent UTI    Past Surgical History:  Procedure Laterality Date   BREAST BIOPSY Right 2019   BREAST BIOPSY Right 12/25/2022   US  RT BREAST BX W LOC DEV 1ST LESION IMG BX SPEC US  GUIDE 12/25/2022 [facility]   BREAST BIOPSY Right 01/01/2023   US  RT BREAST BX W LOC DEV 1ST LESION IMG BX SPEC US  GUIDE 01/01/2023 [facility]   BREAST BIOPSY  02/25/2023   MM RT RADIOACTIVE SEED LOC MAMMO GUIDE 02/25/2023 [facility]   BREAST BIOPSY  02/25/2023   MM RT RADIOACTIVE SEED EA ADD LESION LOC MAMMO GUIDE 02/25/2023 [facility]   BREAST LUMPECTOMY WITH RADIOACTIVE SEED LOCALIZATION Right  02/26/2023   Procedure: RIGHT BREAST BRACKETED SEED LUMPECTOMY;  Surgeon: Vanderbilt Ned, MD;  Location: [facility];  Service: General;  Laterality: Right;   IR ANGIOGRAM SELECTIVE EACH ADDITIONAL VESSEL  09/28/2020   IR ANGIOGRAM SELECTIVE EACH ADDITIONAL VESSEL  09/28/2020   IR ANGIOGRAM SELECTIVE EACH ADDITIONAL VESSEL  09/28/2020   IR ANGIOGRAM SELECTIVE EACH ADDITIONAL VESSEL  09/28/2020   IR ANGIOGRAM VISCERAL SELECTIVE  09/28/2020   IR ANGIOGRAM VISCERAL SELECTIVE  09/28/2020   IR EMBO ART  VEN HEMORR LYMPH EXTRAV  INC GUIDE ROADMAPPING  09/28/2020   IR US  GUIDE VASC ACCESS RIGHT  09/28/2020   TONSILLECTOMY     Family History  Problem Relation Age of Onset   Hearing loss Mother    Miscarriages / [place] Mother    Alcohol abuse  Father    Hearing loss Father    Hypertension Father    Diabetes Brother    Cancer Maternal Grandmother        possibly had breast cancer dx. in her 30s   Diabetes Paternal Grandmother    Social History   Socioeconomic History   Marital status: Married    Spouse name: Not on file   Number of children: 3   Years of education: Not on file   Highest education level: Master's degree (e.g., MA, MS, MEng, MEd, MSW, MBA)  Occupational History   Occupation: Artist  Tobacco Use   Smoking status: Never   Smokeless tobacco: Never  Vaping Use   Vaping status: Never Used  Substance and Sexual Activity   Alcohol use: No   Drug use: No   Sexual activity: Not Currently    Birth control/protection: None  Other Topics Concern   Not on file  Social History Narrative   1 grand      Retired Runner, broadcasting/film/video.   Artist now   Social Drivers of Corporate investment banker Strain: Low Risk  (05/25/2024)   Overall Financial Resource Strain (CARDIA)    Difficulty of Paying Living Expenses: Not hard at all  Food Insecurity: No Food Insecurity (05/25/2024)   Hunger Vital Sign    Worried About Running Out of Food in the Last Year: Never true    Ran  Out of Food in the Last Year: Never true  Transportation Needs: No Transportation Needs (05/25/2024)   PRAPARE - Administrator, Civil Service (Medical): No    Lack of Transportation (Non-Medical): No  Physical Activity: Inactive (05/25/2024)   Exercise Vital Sign    Days of Exercise per Week: 0 days    Minutes of Exercise per Session: 0 min  Stress: No Stress Concern Present (05/25/2024)   [employer] of Occupational Health - Occupational Stress Questionnaire    Feeling of Stress: Not at all  Social Connections: Moderately Integrated (05/25/2024)   Social Connection and Isolation Panel    Frequency of Communication with Friends and Family: More than three times a week    Frequency of Social Gatherings with Friends and Family: More than three times a week    Attends Religious Services: More than 4 times per year    Active Member of [employer] or Organizations: No    Attends Engineer, structural: Never    Marital Status: Married    Tobacco Counseling Counseling given: Not Answered    Clinical Intake:  Pre-visit preparation completed: Yes  Pain : No/denies pain Pain Score: 0-No pain     BMI - recorded: 35.32 Nutritional Status: BMI > 30  Obese Diabetes: Yes CBG done?: No Did pt. bring in CBG monitor from home?: No  Lab Results  Component Value Date   HGBA1C 6.7 (H) 01/09/2024   HGBA1C 6.6 (H) 07/11/2023   HGBA1C 6.4 11/29/2022     How often do you need to have someone help you when you read instructions, pamphlets, or other written materials from your doctor or pharmacy?: 1 - Never  Interpreter Needed?: No  Information entered by :: Ellouise Haws, LPN   Activities of Daily Living     05/24/2024    3:31 PM  In your present state of health, do you have any difficulty performing the following activities:  Hearing? 0  Vision? 1  Comment uses a magnifer as well  Difficulty concentrating or making decisions? 0  Walking or climbing  stairs?  1  Comment weak and sob  Dressing or bathing? 0  Doing errands, shopping? 0  Preparing Food and eating ? Y  Using the Toilet? N  In the past six months, have you accidently leaked urine? Y  Do you have problems with loss of bowel control? Y  Managing your Medications? N  Managing your Finances? N  Housekeeping or managing your Housekeeping? Y    Patient Care Team: Wendolyn Jenkins Jansky, MD as PCP - General (Family Medicine) Odean Potts, MD as Consulting Physician (Hematology and Oncology) Vanderbilt Ned, MD as Consulting Physician (General Surgery)  I have updated your Care Teams any recent Medical Services you may have received from other providers in the past year.     Assessment:   This is a routine wellness examination for Monica Rogers.  Hearing/Vision screen Hearing Screening - Comments:: Pt denies any hearing issues  Vision Screening - Comments:: Wears rx glasses - not up to date with routine eye exams with [facility]    Goals Addressed             This Visit's Progress    Patient Stated       To be able to function in a normal way        Depression Screen     05/25/2024    3:11 PM 04/03/2024   11:39 AM 12/10/2023   11:48 AM 07/11/2023   12:08 PM 05/20/2023    3:11 PM 04/04/2023   11:42 AM 01/15/2023    8:41 AM  PHQ 2/9 Scores  PHQ - 2 Score 0 0 0 0 0 0 0  PHQ- 9 Score 0 0 0 4 0 3     Fall Risk     05/24/2024    3:31 PM 04/03/2024   11:38 AM 12/10/2023   11:48 AM 07/11/2023   12:08 PM 05/20/2023    3:13 PM  Fall Risk   Falls in the past year? 0 0 0 0 0  Number falls in past yr: 0 0 0 0 0  Injury with Fall? 0 0 0 0 0  Risk for fall due to : Impaired mobility;Impaired balance/gait No Fall Risks No Fall Risks  Impaired vision  Follow up Falls prevention discussed Falls evaluation completed  Falls evaluation completed Falls prevention discussed    MEDICARE RISK AT HOME:  Medicare Risk at Home Any stairs in or around the home?: (Patient-Rptd) Yes If so, are  there any without handrails?: (Patient-Rptd) Yes Home free of loose throw rugs in walkways, pet beds, electrical cords, etc?: (Patient-Rptd) No Adequate lighting in your home to reduce risk of falls?: (Patient-Rptd) Yes Life alert?: (Patient-Rptd) No Use of a cane, walker or w/c?: (Patient-Rptd) No Grab bars in the bathroom?: (Patient-Rptd) No Shower chair or bench in shower?: (Patient-Rptd) Yes Elevated toilet seat or a handicapped toilet?: (Patient-Rptd) Yes  TIMED UP AND GO:  Was the test performed?  No  Cognitive Function: 6CIT completed        05/25/2024    3:13 PM 05/20/2023    3:14 PM 04/26/2022   10:43 AM  6CIT Screen  What Year? 0 points 0 points 0 points  What month? 0 points 0 points 0 points  What time? 0 points 0 points 0 points  Count back from 20 0 points 0 points 0 points  Months in reverse 0 points 0 points 0 points  Repeat phrase 0 points 0 points 2 points  Total Score 0 points 0 points 2  points    Immunizations Immunization History  Administered Date(s) Administered   PFIZER(Purple Top)SARS-COV-2 Vaccination 09/21/2019    Screening Tests Health Maintenance  Topic Date Due   Diabetic kidney evaluation - Urine ACR  Never done   Zoster Vaccines- Shingrix (1 of 2) Never done   COVID-19 Vaccine (2 - Pfizer risk series) 10/12/2019   OPHTHALMOLOGY EXAM  08/31/2023   Colonoscopy  12/13/2023   Influenza Vaccine  Never done   Pneumococcal Vaccine: 50+ Years (1 of 2 - PCV) 09/10/2024 (Originally 01/07/1970)   DTaP/Tdap/Td (1 - Tdap) 12/09/2024 (Originally 01/07/1970)   FOOT EXAM  07/10/2024   HEMOGLOBIN A1C  07/10/2024   Mammogram  12/15/2024   Diabetic kidney evaluation - eGFR measurement  01/08/2025   Medicare Annual Wellness (AWV)  05/25/2025   DEXA SCAN  Completed   Hepatitis C Screening  Completed   Meningococcal B Vaccine  Aged Out    Health Maintenance Items Addressed: Vaccines Due: declined and discussed , See Nurse Notes at the end of this  note  Additional Screening:  Vision Screening: Recommended annual ophthalmology exams for early detection of glaucoma and other disorders of the eye. Is the patient up to date with their annual eye exam?  No  Who is the provider or what is the name of the office in which the patient attends annual eye exams? [facility]   Dental Screening: Recommended annual dental exams for proper oral hygiene  Community Resource Referral / Chronic Care Management: CRR required this visit?  No   CCM required this visit?  No   Plan:    I have personally reviewed and noted the following in the patient's chart:   Medical and social history Use of alcohol, tobacco or illicit drugs  Current medications and supplements including opioid prescriptions. Patient is not currently taking opioid prescriptions. Functional ability and status Nutritional status Physical activity Advanced directives List of other physicians Hospitalizations, surgeries, and ER visits in previous 12 months Vitals Screenings to include cognitive, depression, and falls Referrals and appointments  In addition, I have reviewed and discussed with patient certain preventive protocols, quality metrics, and best practice recommendations. A written personalized care plan for preventive services as well as general preventive health recommendations were provided to patient.   Ellouise VEAR Haws, LPN   [id number]   After Visit Summary: (MyChart) Due to this being a telephonic visit, the after visit summary with patients personalized plan was offered to patient via MyChart   Notes: Nothing significant to report at this time.

## 2024-05-25 NOTE — Patient Instructions (Signed)
 Monica Rogers,  Thank you for taking the time for your Medicare Wellness Visit. I appreciate your continued commitment to your health goals. Please review the care plan we discussed, and feel free to reach out if I can assist you further.  Medicare recommends these wellness visits once per year to help you and your care team stay ahead of potential health issues. These visits are designed to focus on prevention, allowing your provider to concentrate on managing your acute and chronic conditions during your regular appointments.  Please note that Annual Wellness Visits do not include a physical exam. Some assessments may be limited, especially if the visit was conducted virtually. If needed, we may recommend a separate in-person follow-up with your provider.  Ongoing Care Seeing your primary care provider every 3 to 6 months helps us  monitor your health and provide consistent, personalized care.   Referrals If a referral was made during today's visit and you haven't received any updates within two weeks, please contact the referred provider directly to check on the status.  Recommended Screenings:  Health Maintenance  Topic Date Due   Yearly kidney health urinalysis for diabetes  Never done   Zoster (Shingles) Vaccine (1 of 2) Never done   COVID-19 Vaccine (2 - Pfizer risk series) 10/12/2019   Eye exam for diabetics  08/31/2023   Colon Cancer Screening  12/13/2023   Flu Shot  Never done   Medicare Annual Wellness Visit  05/19/2024   Pneumococcal Vaccine for age over 43 (1 of 2 - PCV) 09/10/2024*   DTaP/Tdap/Td vaccine (1 - Tdap) 12/09/2024*   Complete foot exam   07/10/2024   Hemoglobin A1C  07/10/2024   Breast Cancer Screening  12/15/2024   Yearly kidney function blood test for diabetes  01/08/2025   DEXA scan (bone density measurement)  Completed   Hepatitis C Screening  Completed   Meningitis B Vaccine  Aged Out  *Topic was postponed. The date shown is not the original due date.        05/20/2023    3:14 PM  Advanced Directives  Does Patient Have a Medical Advance Directive? Yes  Type of Estate agent of [place];Living will  Copy of Healthcare Power of Attorney in Chart? No - copy requested   Advance Care Planning is important because it: Ensures you receive medical care that aligns with your values, goals, and preferences. Provides guidance to your family and loved ones, reducing the emotional burden of decision-making during critical moments.  Vision: Annual vision screenings are recommended for early detection of glaucoma, cataracts, and diabetic retinopathy. These exams can also reveal signs of chronic conditions such as diabetes and high blood pressure.  Dental: Annual dental screenings help detect early signs of oral cancer, gum disease, and other conditions linked to overall health, including heart disease and diabetes.  Please see the attached documents for additional preventive care recommendations.

## 2024-06-02 LAB — LAB REPORT - SCANNED: Creatinine, POC: 11 mg/dL

## 2024-06-14 ENCOUNTER — Encounter: Payer: Self-pay | Admitting: Pharmacist

## 2024-06-14 NOTE — Progress Notes (Signed)
 Pharmacy Quality Measure Review  Patient is showing as needing UACR / kidney disease evaluation for 2025. She has type 2 DM noted. She is not currently on pharmacotherapy for diabetes.    Reviewed chart. No UACR on file. She is taking olmesartan . Patient has follow up appointment with PCP 07/14/2024 - place note in appointment notes to check UACR at upcoming appointment.   Madelin Ray, PharmD Clinical Pharmacist [facility] Primary Care  Population Health [phone]

## 2024-07-04 ENCOUNTER — Encounter: Payer: Self-pay | Admitting: Gastroenterology

## 2024-07-14 ENCOUNTER — Encounter: Payer: Self-pay | Admitting: Family Medicine

## 2024-07-14 ENCOUNTER — Ambulatory Visit (INDEPENDENT_AMBULATORY_CARE_PROVIDER_SITE_OTHER): Admitting: Family Medicine

## 2024-07-14 VITALS — BP 116/74 | HR 90 | Temp 97.1°F | Ht 64.5 in | Wt 204.5 lb

## 2024-07-14 DIAGNOSIS — E034 Atrophy of thyroid (acquired): Secondary | ICD-10-CM

## 2024-07-14 DIAGNOSIS — E1165 Type 2 diabetes mellitus with hyperglycemia: Secondary | ICD-10-CM

## 2024-07-14 DIAGNOSIS — C50511 Malignant neoplasm of lower-outer quadrant of right female breast: Secondary | ICD-10-CM

## 2024-07-14 DIAGNOSIS — I10 Essential (primary) hypertension: Secondary | ICD-10-CM

## 2024-07-14 DIAGNOSIS — I5189 Other ill-defined heart diseases: Secondary | ICD-10-CM | POA: Diagnosis not present

## 2024-07-14 DIAGNOSIS — E7800 Pure hypercholesterolemia, unspecified: Secondary | ICD-10-CM | POA: Diagnosis not present

## 2024-07-14 DIAGNOSIS — Z853 Personal history of malignant neoplasm of breast: Secondary | ICD-10-CM

## 2024-07-14 LAB — CBC WITH DIFFERENTIAL/PLATELET
Basophils Absolute: 0.1 K/uL (ref 0.0–0.1)
Basophils Relative: 0.8 % (ref 0.0–3.0)
Eosinophils Absolute: 0.4 K/uL (ref 0.0–0.7)
Eosinophils Relative: 4.4 % (ref 0.0–5.0)
HCT: 39.6 % (ref 36.0–46.0)
Hemoglobin: 13.1 g/dL (ref 12.0–15.0)
Lymphocytes Relative: 14.9 % (ref 12.0–46.0)
Lymphs Abs: 1.5 K/uL (ref 0.7–4.0)
MCHC: 33.1 g/dL (ref 30.0–36.0)
MCV: 91.3 fl (ref 78.0–100.0)
Monocytes Absolute: 0.9 K/uL (ref 0.1–1.0)
Monocytes Relative: 9 % (ref 3.0–12.0)
Neutro Abs: 7 K/uL (ref 1.4–7.7)
Neutrophils Relative %: 70.9 % (ref 43.0–77.0)
Platelets: 295 K/uL (ref 150.0–400.0)
RBC: 4.33 Mil/uL (ref 3.87–5.11)
RDW: 15.2 % (ref 11.5–15.5)
WBC: 9.8 K/uL (ref 4.0–10.5)

## 2024-07-14 LAB — LIPID PANEL
Cholesterol: 203 mg/dL — ABNORMAL HIGH (ref 0–200)
HDL: 39.6 mg/dL (ref >39.00)
LDL Cholesterol: 126 mg/dL — ABNORMAL HIGH (ref 0–99)
NonHDL: 163.58
Total CHOL/HDL Ratio: 5
Triglycerides: 190 mg/dL — ABNORMAL HIGH (ref 0.0–149.0)
VLDL: 38 mg/dL (ref 0.0–40.0)

## 2024-07-14 LAB — T3, FREE: T3, Free: 3.9 pg/mL (ref 2.3–4.2)

## 2024-07-14 LAB — COMPREHENSIVE METABOLIC PANEL WITH GFR
ALT: 28 U/L (ref 0–35)
AST: 22 U/L (ref 0–37)
Albumin: 4.3 g/dL (ref 3.5–5.2)
Alkaline Phosphatase: 68 U/L (ref 39–117)
BUN: 18 mg/dL (ref 6–23)
CO2: 29 meq/L (ref 19–32)
Calcium: 9.9 mg/dL (ref 8.4–10.5)
Chloride: 103 meq/L (ref 96–112)
Creatinine, Ser: 0.78 mg/dL (ref 0.40–1.20)
GFR: 74.25 mL/min (ref >60.00)
Glucose, Bld: 128 mg/dL — ABNORMAL HIGH (ref 70–99)
Potassium: 4.4 meq/L (ref 3.5–5.1)
Sodium: 140 meq/L (ref 135–145)
Total Bilirubin: 0.4 mg/dL (ref 0.2–1.2)
Total Protein: 7.2 g/dL (ref 6.0–8.3)

## 2024-07-14 LAB — HEMOGLOBIN A1C: Hgb A1c MFr Bld: 6.2 % (ref 4.6–6.5)

## 2024-07-14 LAB — T4, FREE: Free T4: 0.58 ng/dL — ABNORMAL LOW (ref 0.60–1.60)

## 2024-07-14 LAB — TSH: TSH: 2.2 u[IU]/mL (ref 0.35–5.50)

## 2024-07-14 LAB — MICROALBUMIN / CREATININE URINE RATIO
Creatinine,U: 177.7 mg/dL
Microalb Creat Ratio: 10.6 mg/g (ref 0.0–30.0)
Microalb, Ur: 1.9 mg/dL (ref 0.0–1.9)

## 2024-07-14 MED ORDER — ONETOUCH VERIO FLEX SYSTEM W/DEVICE KIT: 1.0000 | PACK | Freq: Every day | 1 refills | Status: AC

## 2024-07-14 MED ORDER — ONETOUCH DELICA PLUS LANCET33G MISC: 1.0000 | Freq: Every day | 3 refills | Status: AC

## 2024-07-14 MED ORDER — ONETOUCH VERIO VI STRP: 1.0000 | ORAL_STRIP | Freq: Every day | 3 refills | Status: AC

## 2024-07-14 MED ORDER — ONETOUCH DELICA PLUS LANCING MISC: 1.0000 | Freq: Once | 1 refills | Status: AC

## 2024-07-14 NOTE — Patient Instructions (Addendum)
 It was very nice to see you today!  Happy Holidays  Consider zetia for cholesterol  See eye doctor  Check sugars at least weekly.  Alternate times-like fasting(<120), right before meal(<140).  2 hrs after meal (140-160)   PLEASE NOTE:  If you had any lab tests please let us  know if you have not heard back within a few days. You may see your results on MyChart before we have a chance to review them but we will give you a call once they are reviewed by us . If we ordered any referrals today, please let us  know if you have not heard from their office within the next week.   Please try these tips to maintain a healthy lifestyle:  Eat most of your calories during the day when you are active. Eliminate processed foods including packaged sweets (pies, cakes, cookies), reduce intake of potatoes, white bread, white pasta, and white rice. Look for whole grain options, oat flour or almond flour.  Each meal should contain half fruits/vegetables, one quarter protein, and one quarter carbs (no bigger than a computer mouse).  Cut down on sweet beverages. This includes juice, soda, and sweet tea. Also watch fruit intake, though this is a healthier sweet option, it still contains natural sugar! Limit to 3 servings daily.  Drink at least 1 glass of water with each meal and aim for at least 8 glasses per day  Exercise at least 150 minutes every week.

## 2024-07-14 NOTE — Progress Notes (Addendum)
 "  Subjective:     Patient ID: Monica Rogers, female    DOB: December 03, 1950, 73 y.o.   MRN: [id number]  Chief Complaint  Patient presents with   Diabetes   Hypertension    Pt is here for follow up on chronic issues    Discussed the use of AI scribe software for clinical note transcription with the patient, who gave verbal consent to proceed.  History of Present Illness Monica Rogers is a 73 year old female with hypertension, diabetes, hyperlipidemia, thyroid  disorder, and a history of breast cancer who presents for follow-up.  Her breast cancer is currently being monitored without recent treatments such as radiation. She last had a check-up in the spring or summer of the previous year and was stable at that time. R breast-tx'd w/lumpectomy and declines further tx.  Being monitored  For hypertension, she is taking olmesartan  20 mg but has not been monitoring her blood pressure at home. No symptoms of hypotension such as dizziness or lightheadedness. She experienced heart racing once this month without associated dizziness. She had an echocardiogram in July, and she recalls that it seemed to turn out okay.  Regarding diabetes, she manages it through diet and exercise but finds it challenging due to her husband's illness. She has not been checking her blood sugar regularly and is using an older glucose monitor. She is not on any diabetes medications.  She is not currently taking any medication for hyperlipidemia and is hesitant about re-starting statins.  For her thyroid  disorder, she feels stable on her current dose and denies symptoms such as shakiness or jitteriness. She has lost five pounds.  Socially, she is the primary caregiver for her husband who has heart arrhythmia and COPD, which has increased her responsibilities at home and affected her ability to exercise regularly. She reports significant stress over the past six months due to her husband's health issues.  She recently had a cold  lasting about a week with significant coughing, which has since resolved. She used echinacea and another supplement to help alleviate symptoms.  She has not seen an eye doctor recently but plans to make an appointment for diabetes-related eye checks.    Health Maintenance Due  Topic Date Due   OPHTHALMOLOGY EXAM  08/31/2023    Past Medical History:  Diagnosis Date   Allergy    Anemia    Arthritis    Blood transfusion without reported diagnosis    Breast cancer (HCC) 2024   R   Chronic headaches    Diverticulosis    Hyperlipidemia    Hypertension    Hypothyroidism    IBS (irritable bowel syndrome)    Prediabetes    Recurrent UTI     Past Surgical History:  Procedure Laterality Date   BREAST BIOPSY Right 2019   BREAST BIOPSY Right 12/25/2022   US  RT BREAST BX W LOC DEV 1ST LESION IMG BX SPEC US  GUIDE 12/25/2022 [facility]   BREAST BIOPSY Right 01/01/2023   US  RT BREAST BX W LOC DEV 1ST LESION IMG BX SPEC US  GUIDE 01/01/2023 [facility]   BREAST BIOPSY  02/25/2023   MM RT RADIOACTIVE SEED LOC MAMMO GUIDE 02/25/2023 [facility]   BREAST BIOPSY  02/25/2023   MM RT RADIOACTIVE SEED EA ADD LESION LOC MAMMO GUIDE 02/25/2023 [facility]   BREAST LUMPECTOMY WITH RADIOACTIVE SEED LOCALIZATION Right 02/26/2023   Procedure: RIGHT BREAST BRACKETED SEED LUMPECTOMY;  Surgeon: Vanderbilt Ned, MD;  Location: [facility];  Service: General;  Laterality: Right;   IR ANGIOGRAM SELECTIVE EACH ADDITIONAL VESSEL  09/28/2020   IR ANGIOGRAM SELECTIVE EACH ADDITIONAL VESSEL  09/28/2020   IR ANGIOGRAM SELECTIVE EACH ADDITIONAL VESSEL  09/28/2020   IR ANGIOGRAM SELECTIVE EACH ADDITIONAL VESSEL  09/28/2020   IR ANGIOGRAM VISCERAL SELECTIVE  09/28/2020   IR ANGIOGRAM VISCERAL SELECTIVE  09/28/2020   IR EMBO ART  VEN HEMORR LYMPH EXTRAV  INC GUIDE ROADMAPPING  09/28/2020   IR US  GUIDE VASC ACCESS RIGHT  09/28/2020   TONSILLECTOMY       Current Outpatient  Medications:    AMBULATORY NON FORMULARY MEDICATION, Bone Up 1 tablet in the morning and 1 in the afternoon, Disp: , Rfl:    ARMOUR THYROID  120 MG tablet, TAKE 1 TABLET BY MOUTH EVERY DAY, Disp: 30 tablet, Rfl: 14   Ascorbic Acid (VITAMIN C) 100 MG CHEW, , Disp: , Rfl:    B Complex Vitamins (VITAMIN B COMPLEX 100 IJ), , Disp: , Rfl:    Blood Glucose Monitoring Suppl (ONETOUCH VERIO FLEX SYSTEM) w/Device KIT, 1 each by Does not apply route daily at 12 noon., Disp: 1 kit, Rfl: 1   CALCIUM  PO, Take 1 tablet by mouth daily., Disp: , Rfl:    Carboxymethylcellulose Sodium (EYE DROPS OP), Apply 2 drops to eye 2 (two) times daily as needed (dry eyes)., Disp: , Rfl:    Cholecalciferol (VITAMIN D3) 50 MCG (2000 UT) capsule, , Disp: , Rfl:    clotrimazole-betamethasone  (LOTRISONE) cream, Apply a small amount to affected area 1-2 times per day for 14 days, then stop and use as needed., Disp: , Rfl:    Coenzyme Q10 (COQ-10) 100 MG capsule, , Disp: , Rfl:    dexamethasone  (DECADRON ) 0.1 % ophthalmic solution, SMARTSIG:2 Drop(s) In Ear(s), Disp: , Rfl:    Digestive Enzymes (ENZYME DIGEST) CAPS, Take 2 capsules by mouth daily., Disp: , Rfl:    docusate sodium  (COLACE) 250 MG capsule, Take 1 capsule (250 mg total) by mouth daily., Disp: 10 capsule, Rfl: 0   glucose blood (ONETOUCH VERIO) test strip, 1 each by Other route daily at 12 noon. Use as instructed, Disp: 100 each, Rfl: 3   Lancets (ONETOUCH DELICA PLUS LANCET33G) MISC, 1 each by Does not apply route daily at 12 noon., Disp: 100 each, Rfl: 3   Magnesium 110 MG CAPS, Take 2 capsules by mouth 2 (two) times daily., Disp: , Rfl:    naproxen (NAPROSYN) 500 MG tablet, Take 500 mg by mouth 2 (two) times daily with a meal., Disp: , Rfl:    olmesartan  (BENICAR ) 20 MG tablet, TAKE 1 TABLET BY MOUTH EVERY DAY, Disp: 90 tablet, Rfl: 3   Omega-3 Fatty Acids (FISH OIL PO), Take 1 tablet by mouth 2 (two) times daily., Disp: , Rfl:    QUERCETIN PO, Take 2 tablets by  mouth 3 (three) times daily., Disp: , Rfl:    Testosterone  1.62 % GEL, , Disp: , Rfl:    UNABLE TO FIND, Take 4 capsules by mouth daily. Med Name: ZYFLAMEND HERBAL PAIN RELIEF, Disp: , Rfl:    Zinc Sulfate 66 MG TABS, Take 132 mg by mouth daily., Disp: , Rfl:    ezetimibe  (ZETIA ) 10 MG tablet, Take 1 tablet (10 mg total) by mouth every morning., Disp: 90 tablet, Rfl: 1  Allergies  Allergen Reactions   Bacid Cough   Grass Pollen(K-O-R-T-Swt Vern) Cough   Timothy Grass Pollen Allergen Other (See Comments) and Cough   Wheat Cough  ROS neg/noncontributory except as noted HPI/below      Objective:     BP 116/74 (BP Location: Left Arm, Patient Position: Sitting, Cuff Size: Large)   Pulse 90   Temp (!) 97.1 F (36.2 C) (Temporal)   Ht 5' 4.5 (1.638 m)   Wt 204 lb 8 oz (92.8 kg)   SpO2 95%   BMI 34.56 kg/m  Wt Readings from Last 3 Encounters:  07/14/24 204 lb 8 oz (92.8 kg)  05/25/24 209 lb (94.8 kg)  04/03/24 209 lb 3.2 oz (94.9 kg)    Physical Exam GENERAL: Well developed, well nourished, no acute distress. HEAD EYES EARS NOSE THROAT: Normocephalic, atraumatic, conjunctiva not injected, sclera nonicteric. CARDIAC: Regular rate and rhythm, S1 S2 present, 1-2/6 systolic murmur, dorsalis pedis 2 plus bilaterally. NECK: Supple, no thyromegaly, no nodes, no carotid bruits. LUNGS: Clear to auscultation bilaterally, no wheezes. ABDOMEN: Bowel sounds present, soft, non-tender, non-distended, no hepatosplenomegaly, no masses. EXTREMITIES: No edema, extremities normal. MUSCULOSKELETAL: No gross abnormalities. NEUROLOGICAL: Alert and oriented x3, cranial nerves II through XII intact, sensation intact. PSYCHIATRIC: Normal mood, good eye contact.      Diabetic Foot Exam - Simple   Simple Foot Form Diabetic Foot exam was performed with the following findings: Yes 07/14/2024 11:44 AM  Visual Inspection No deformities, no ulcerations, no other skin breakdown bilaterally:  Yes Sensation Testing Intact to touch and monofilament testing bilaterally: Yes Pulse Check Posterior Tibialis and Dorsalis pulse intact bilaterally: Yes Comments      Assessment & Plan:  Type 2 diabetes mellitus with hyperglycemia, without long-term current use of insulin  (HCC) -     Comprehensive metabolic panel with GFR -     Hemoglobin A1c -     Microalbumin / creatinine urine ratio -     OneTouch Verio Flex System; 1 each by Does not apply route daily at 12 noon.  Dispense: 1 kit; Refill: 1 -     OneTouch Verio; 1 each by Other route daily at 12 noon. Use as instructed  Dispense: 100 each; Refill: 3 -     OneTouch Delica Plus Lancet33G; 1 each by Does not apply route daily at 12 noon.  Dispense: 100 each; Refill: 3 -     OneTouch Delica Plus Lancing; 1 each by Does not apply route once for 1 dose.  Dispense: 1 each; Refill: 1  Primary hypertension -     CBC with Differential/Platelet -     Comprehensive metabolic panel with GFR  Pure hypercholesterolemia -     Comprehensive metabolic panel with GFR -     Lipid panel  Hypothyroidism due to acquired atrophy of thyroid  -     TSH -     T4, free -     T3, free  History of breast cancer    Assessment and Plan Assessment & Plan Type 2 diabetes mellitus   Her diabetes is managed through diet and exercise, but she faces challenges maintaining her exercise routine due to her husband's illness. She is not on medication and her blood sugar monitoring is inconsistent. Encourage regular blood sugar monitoring at least weekly, alternating between fasting, before meals, and two hours after meals. Provide a list of blood sugar monitoring times and ranges. Advise her to inform her eye doctor about her diabetes for a special check-up. An annual foot exam was performed.  Primary hypertension   Her hypertension is managed with olmesartan  20 mg. She has not been monitoring her blood pressure at home recently.  There are no symptoms of  hypotension such as dizziness or lightheadedness, although previous episodes of low blood pressure were noted. Continue olmesartan  20 mg daily and encourage home blood pressure monitoring.  Pure hypercholesterolemia   She is not on medication and is hesitant about statins due to perceived low efficacy and side effects. Alternative options like Zetia , which blocks cholesterol absorption in the gut, were discussed. Consider Zetia  for cholesterol management and discuss potential side effects.  Hypothyroidism due to acquired atrophy of thyroid    Her hypothyroidism is well-managed on her current thyroid  medication. She has no symptoms of hyperthyroidism such as shakiness or jitteriness. Recent weight loss is likely due to increased activity from caregiving responsibilities. Continue the current thyroid  medication regimen.  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 Continue monitoring for recurrence.    Grade 1 diastolic dysfunction   This was noted on an echocardiogram and is related to age and hypertension. She has no symptoms of heart failure. Continue blood pressure management to support heart function.  General health maintenance   Discussed the importance of regular eye exams due to diabetes. Encourage stress management and consider potential assistance with household responsibilities to reduce stress. Schedule an eye exam and inform the doctor about her diabetes. Consider stress management strategies and potential assistance with household tasks.     Return in about 6 months (around 01/12/2025) for chronic follow-up.  Jenkins CHRISTELLA Carrel, MD  "

## 2024-07-15 ENCOUNTER — Ambulatory Visit: Payer: Self-pay | Admitting: Family Medicine

## 2024-07-15 DIAGNOSIS — E7800 Pure hypercholesterolemia, unspecified: Secondary | ICD-10-CM

## 2024-07-15 MED ORDER — EZETIMIBE 10 MG PO TABS: 10.0000 mg | ORAL_TABLET | Freq: Every morning | ORAL | 1 refills | Status: AC

## 2024-07-15 NOTE — Progress Notes (Signed)
 Labs great except cholesterol.  Is she willing to do the zetia 10mg  daily-if so send rx #90/1 and repeat lipids 3 months.    Sugars doing great!

## 2024-07-31 DIAGNOSIS — Z853 Personal history of malignant neoplasm of breast: Secondary | ICD-10-CM | POA: Insufficient documentation

## 2025-01-12 ENCOUNTER — Ambulatory Visit: Admitting: Family Medicine

## 2025-05-31 ENCOUNTER — Ambulatory Visit
